# Patient Record
Sex: Female | Born: 1985 | Hispanic: Yes | Smoking: Never smoker
Health system: Southern US, Community
[De-identification: ages and names within clinical notes are randomized; demographics above are authoritative.]

## PROBLEM LIST (undated history)

## (undated) DIAGNOSIS — Z789 Other specified health status: Secondary | ICD-10-CM

## (undated) DIAGNOSIS — Z8659 Personal history of other mental and behavioral disorders: Secondary | ICD-10-CM

## (undated) DIAGNOSIS — F99 Mental disorder, not otherwise specified: Secondary | ICD-10-CM

## (undated) DIAGNOSIS — D689 Coagulation defect, unspecified: Secondary | ICD-10-CM

## (undated) DIAGNOSIS — R87629 Unspecified abnormal cytological findings in specimens from vagina: Secondary | ICD-10-CM

## (undated) DIAGNOSIS — I959 Hypotension, unspecified: Secondary | ICD-10-CM

## (undated) DIAGNOSIS — F32A Depression, unspecified: Secondary | ICD-10-CM

## (undated) DIAGNOSIS — I2699 Other pulmonary embolism without acute cor pulmonale: Secondary | ICD-10-CM

## (undated) DIAGNOSIS — Z8489 Family history of other specified conditions: Secondary | ICD-10-CM

## (undated) DIAGNOSIS — T883XXA Malignant hyperthermia due to anesthesia, initial encounter: Secondary | ICD-10-CM

## (undated) DIAGNOSIS — D649 Anemia, unspecified: Secondary | ICD-10-CM

## (undated) DIAGNOSIS — Z811 Family history of alcohol abuse and dependence: Secondary | ICD-10-CM

## (undated) DIAGNOSIS — Z0282 Encounter for adoption services: Secondary | ICD-10-CM

## (undated) HISTORY — PX: DENTAL SURGERY: SHX609

## (undated) HISTORY — DX: Anemia, unspecified: D64.9

## (undated) HISTORY — DX: Coagulation defect, unspecified: D68.9

## (undated) HISTORY — DX: Family history of alcohol abuse and dependence: Z81.1

## (undated) HISTORY — DX: Mental disorder, not otherwise specified: F99

## (undated) HISTORY — DX: Unspecified abnormal cytological findings in specimens from vagina: R87.629

---
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
## 2005-02-28 ENCOUNTER — Emergency Department: Payer: Self-pay | Admitting: Internal Medicine

## 2005-03-01 ENCOUNTER — Ambulatory Visit: Payer: Self-pay | Admitting: Internal Medicine

## 2005-04-08 ENCOUNTER — Emergency Department: Payer: Self-pay | Admitting: Unknown Physician Specialty

## 2005-05-16 ENCOUNTER — Observation Stay: Payer: Self-pay | Admitting: Obstetrics and Gynecology

## 2005-06-14 ENCOUNTER — Emergency Department: Payer: Self-pay | Admitting: Internal Medicine

## 2005-06-19 ENCOUNTER — Emergency Department: Payer: Self-pay | Admitting: Unknown Physician Specialty

## 2005-06-19 ENCOUNTER — Observation Stay: Payer: Self-pay | Admitting: Unknown Physician Specialty

## 2005-06-28 ENCOUNTER — Observation Stay: Payer: Self-pay

## 2005-08-23 ENCOUNTER — Observation Stay: Payer: Self-pay

## 2005-09-14 ENCOUNTER — Observation Stay: Payer: Self-pay | Admitting: Obstetrics and Gynecology

## 2005-09-18 ENCOUNTER — Observation Stay: Payer: Self-pay

## 2005-09-21 ENCOUNTER — Observation Stay: Payer: Self-pay | Admitting: Obstetrics and Gynecology

## 2005-09-26 ENCOUNTER — Observation Stay: Payer: Self-pay

## 2005-09-27 ENCOUNTER — Inpatient Hospital Stay: Payer: Self-pay | Admitting: Certified Nurse Midwife

## 2006-01-08 ENCOUNTER — Emergency Department: Payer: Self-pay | Admitting: Emergency Medicine

## 2006-09-05 ENCOUNTER — Emergency Department: Payer: Self-pay | Admitting: Emergency Medicine

## 2006-09-09 ENCOUNTER — Emergency Department: Payer: Self-pay | Admitting: Emergency Medicine

## 2007-05-25 ENCOUNTER — Emergency Department: Payer: Self-pay | Admitting: Emergency Medicine

## 2007-06-08 ENCOUNTER — Emergency Department: Payer: Self-pay | Admitting: Emergency Medicine

## 2007-10-03 ENCOUNTER — Emergency Department: Payer: Self-pay | Admitting: Emergency Medicine

## 2008-05-02 ENCOUNTER — Emergency Department: Payer: Self-pay | Admitting: Emergency Medicine

## 2008-12-29 ENCOUNTER — Emergency Department: Payer: Self-pay | Admitting: Emergency Medicine

## 2009-03-08 ENCOUNTER — Emergency Department: Payer: Self-pay | Admitting: Unknown Physician Specialty

## 2009-08-04 ENCOUNTER — Inpatient Hospital Stay: Payer: Self-pay

## 2009-12-14 ENCOUNTER — Emergency Department: Payer: Self-pay | Admitting: Unknown Physician Specialty

## 2010-03-09 ENCOUNTER — Emergency Department: Payer: Self-pay | Admitting: Emergency Medicine

## 2010-10-27 ENCOUNTER — Emergency Department: Payer: Self-pay | Admitting: Emergency Medicine

## 2011-01-18 ENCOUNTER — Emergency Department: Payer: Self-pay | Admitting: Emergency Medicine

## 2011-04-17 ENCOUNTER — Emergency Department: Payer: Self-pay | Admitting: *Deleted

## 2011-05-06 ENCOUNTER — Emergency Department: Payer: Self-pay | Admitting: Emergency Medicine

## 2011-05-18 DIAGNOSIS — I2699 Other pulmonary embolism without acute cor pulmonale: Secondary | ICD-10-CM

## 2011-05-18 HISTORY — DX: Other pulmonary embolism without acute cor pulmonale: I26.99

## 2011-05-25 ENCOUNTER — Emergency Department: Payer: Self-pay | Admitting: *Deleted

## 2012-05-19 ENCOUNTER — Emergency Department: Payer: Self-pay | Admitting: Emergency Medicine

## 2012-05-19 LAB — CBC
MCHC: 34.1 g/dL (ref 32.0–36.0)
Platelet: 216 10*3/uL (ref 150–440)
RBC: 4.16 10*6/uL (ref 3.80–5.20)
RDW: 12.4 % (ref 11.5–14.5)
WBC: 5.5 10*3/uL (ref 3.6–11.0)

## 2012-05-19 LAB — URINALYSIS, COMPLETE
Bacteria: NONE SEEN
Blood: NEGATIVE
Glucose,UR: NEGATIVE mg/dL (ref 0–75)
Leukocyte Esterase: NEGATIVE
Nitrite: NEGATIVE
Protein: NEGATIVE
RBC,UR: 2 /HPF (ref 0–5)
Specific Gravity: 1.019 (ref 1.003–1.030)

## 2012-05-19 LAB — COMPREHENSIVE METABOLIC PANEL
Albumin: 3.9 g/dL (ref 3.4–5.0)
Alkaline Phosphatase: 84 U/L (ref 50–136)
Anion Gap: 7 (ref 7–16)
BUN: 16 mg/dL (ref 7–18)
Bilirubin,Total: 0.4 mg/dL (ref 0.2–1.0)
Calcium, Total: 9 mg/dL (ref 8.5–10.1)
Chloride: 106 mmol/L (ref 98–107)
Co2: 28 mmol/L (ref 21–32)
Creatinine: 0.72 mg/dL (ref 0.60–1.30)
EGFR (African American): >60
Potassium: 3.9 mmol/L (ref 3.5–5.1)
SGPT (ALT): 21 U/L (ref 12–78)
Total Protein: 7.8 g/dL (ref 6.4–8.2)

## 2012-05-19 LAB — PROTIME-INR: INR: 1

## 2012-05-25 ENCOUNTER — Encounter: Payer: Self-pay | Admitting: Maternal & Fetal Medicine

## 2012-10-28 ENCOUNTER — Emergency Department: Payer: Self-pay | Admitting: Emergency Medicine

## 2012-10-28 LAB — URINALYSIS, COMPLETE
Bacteria: NONE SEEN
Bilirubin,UR: NEGATIVE
Blood: NEGATIVE
Glucose,UR: NEGATIVE mg/dL (ref 0–75)
Ketone: NEGATIVE
Leukocyte Esterase: NEGATIVE
Nitrite: NEGATIVE
Protein: NEGATIVE
Specific Gravity: 1.028 (ref 1.003–1.030)
Squamous Epithelial: 1

## 2012-10-28 LAB — HCG, QUANTITATIVE, PREGNANCY: Beta Hcg, Quant.: 71889 m[IU]/mL — ABNORMAL HIGH

## 2012-10-28 LAB — CBC
HGB: 12.4 g/dL (ref 12.0–16.0)
MCH: 31.1 pg (ref 26.0–34.0)
MCV: 91 fL (ref 80–100)
Platelet: 218 10*3/uL (ref 150–440)
RBC: 3.97 10*6/uL (ref 3.80–5.20)
RDW: 12.3 % (ref 11.5–14.5)

## 2012-12-11 ENCOUNTER — Emergency Department: Payer: Self-pay | Admitting: Emergency Medicine

## 2012-12-11 LAB — BASIC METABOLIC PANEL
Anion Gap: 5 — ABNORMAL LOW (ref 7–16)
Chloride: 104 mmol/L (ref 98–107)
Co2: 26 mmol/L (ref 21–32)
Creatinine: 0.56 mg/dL — ABNORMAL LOW (ref 0.60–1.30)
EGFR (African American): >60
EGFR (Non-African Amer.): >60
Osmolality: 267 (ref 275–301)
Potassium: 4 mmol/L (ref 3.5–5.1)
Sodium: 135 mmol/L — ABNORMAL LOW (ref 136–145)

## 2012-12-11 LAB — URINALYSIS, COMPLETE
Ketone: NEGATIVE
Nitrite: NEGATIVE
Protein: NEGATIVE
WBC UR: <1 /HPF (ref 0–5)

## 2012-12-11 LAB — CBC
HCT: 37 % (ref 35.0–47.0)
HGB: 12.9 g/dL (ref 12.0–16.0)
MCH: 31.7 pg (ref 26.0–34.0)
MCHC: 34.8 g/dL (ref 32.0–36.0)
Platelet: 204 10*3/uL (ref 150–440)
RBC: 4.06 10*6/uL (ref 3.80–5.20)
RDW: 12.9 % (ref 11.5–14.5)

## 2012-12-27 ENCOUNTER — Emergency Department: Payer: Self-pay | Admitting: Emergency Medicine

## 2012-12-27 LAB — COMPREHENSIVE METABOLIC PANEL
Alkaline Phosphatase: 54 U/L (ref 50–136)
Anion Gap: 5 — ABNORMAL LOW (ref 7–16)
Calcium, Total: 8.6 mg/dL (ref 8.5–10.1)
Chloride: 104 mmol/L (ref 98–107)
Co2: 27 mmol/L (ref 21–32)
Creatinine: 0.48 mg/dL — ABNORMAL LOW (ref 0.60–1.30)
EGFR (African American): >60
EGFR (Non-African Amer.): >60
Glucose: 69 mg/dL (ref 65–99)
Potassium: 3.9 mmol/L (ref 3.5–5.1)

## 2012-12-27 LAB — URINALYSIS, COMPLETE
Leukocyte Esterase: NEGATIVE
Nitrite: NEGATIVE
Protein: NEGATIVE
RBC,UR: <1 /HPF (ref 0–5)
Specific Gravity: 1.01 (ref 1.003–1.030)
WBC UR: <1 /HPF (ref 0–5)

## 2012-12-27 LAB — LIPASE, BLOOD: Lipase: 82 U/L (ref 73–393)

## 2012-12-27 LAB — CBC
HCT: 35.6 % (ref 35.0–47.0)
MCHC: 35.2 g/dL (ref 32.0–36.0)
MCV: 91 fL (ref 80–100)
WBC: 7.3 10*3/uL (ref 3.6–11.0)

## 2012-12-27 LAB — HCG, QUANTITATIVE, PREGNANCY: Beta Hcg, Quant.: 20128 m[IU]/mL — ABNORMAL HIGH

## 2013-01-11 ENCOUNTER — Encounter: Payer: Self-pay | Admitting: Maternal & Fetal Medicine

## 2013-02-01 ENCOUNTER — Observation Stay: Payer: Self-pay | Admitting: Obstetrics and Gynecology

## 2013-02-01 LAB — URINALYSIS, COMPLETE
Bilirubin,UR: NEGATIVE
Blood: NEGATIVE
Glucose,UR: NEGATIVE mg/dL (ref 0–75)
Leukocyte Esterase: NEGATIVE
Nitrite: NEGATIVE
Protein: NEGATIVE
Specific Gravity: 1.002 (ref 1.003–1.030)
Squamous Epithelial: 1

## 2013-03-08 ENCOUNTER — Encounter: Payer: Self-pay | Admitting: Maternal and Fetal Medicine

## 2013-03-19 ENCOUNTER — Encounter: Payer: Self-pay | Admitting: Obstetrics & Gynecology

## 2013-03-22 ENCOUNTER — Observation Stay: Payer: Self-pay | Admitting: Obstetrics and Gynecology

## 2013-03-22 LAB — COMPREHENSIVE METABOLIC PANEL
Albumin: 3 g/dL — ABNORMAL LOW (ref 3.4–5.0)
Bilirubin,Total: 0.4 mg/dL (ref 0.2–1.0)
Co2: 23 mmol/L (ref 21–32)
Glucose: 68 mg/dL (ref 65–99)
Osmolality: 265 (ref 275–301)
Total Protein: 6.8 g/dL (ref 6.4–8.2)

## 2013-03-22 LAB — CBC WITH DIFFERENTIAL/PLATELET
Basophil #: 0 10*3/uL (ref 0.0–0.1)
Basophil %: 0.3 %
Eosinophil #: 0.1 10*3/uL (ref 0.0–0.7)
HCT: 34 % — ABNORMAL LOW (ref 35.0–47.0)
Lymphocyte #: 2.9 10*3/uL (ref 1.0–3.6)
Lymphocyte %: 29.5 %
MCV: 91 fL (ref 80–100)
Monocyte %: 4.6 %
Neutrophil #: 6.3 10*3/uL (ref 1.4–6.5)
RBC: 3.72 10*6/uL — ABNORMAL LOW (ref 3.80–5.20)
RDW: 12.8 % (ref 11.5–14.5)
WBC: 9.7 10*3/uL (ref 3.6–11.0)

## 2013-03-22 LAB — HEPATIC FUNCTION PANEL A (ARMC): Bilirubin, Direct: <0.1 mg/dL (ref 0.00–0.20)

## 2013-04-10 ENCOUNTER — Observation Stay: Payer: Self-pay | Admitting: Obstetrics and Gynecology

## 2013-05-17 ENCOUNTER — Observation Stay: Payer: Self-pay

## 2013-05-28 ENCOUNTER — Inpatient Hospital Stay: Payer: Self-pay | Admitting: Obstetrics and Gynecology

## 2013-05-28 LAB — CBC WITH DIFFERENTIAL/PLATELET
BASOS ABS: 0.1 10*3/uL (ref 0.0–0.1)
BASOS PCT: 0.4 %
EOS ABS: 0 10*3/uL (ref 0.0–0.7)
Eosinophil %: 0.4 %
HCT: 34.4 % — ABNORMAL LOW (ref 35.0–47.0)
HGB: 11.7 g/dL — ABNORMAL LOW (ref 12.0–16.0)
Lymphocyte #: 2.2 10*3/uL (ref 1.0–3.6)
Lymphocyte %: 18.7 %
MCH: 30.5 pg (ref 26.0–34.0)
MCHC: 34 g/dL (ref 32.0–36.0)
MCV: 90 fL (ref 80–100)
MONO ABS: 0.5 x10 3/mm (ref 0.2–0.9)
Monocyte %: 4.4 %
Neutrophil #: 8.8 10*3/uL — ABNORMAL HIGH (ref 1.4–6.5)
Neutrophil %: 76.1 %
Platelet: 196 10*3/uL (ref 150–440)
RBC: 3.84 10*6/uL (ref 3.80–5.20)
RDW: 14.1 % (ref 11.5–14.5)
WBC: 11.6 10*3/uL — AB (ref 3.6–11.0)

## 2013-05-28 LAB — CREATININE, SERUM: CREATININE: 0.48 mg/dL — AB (ref 0.60–1.30)

## 2013-05-28 LAB — APTT: Activated PTT: 30.2 secs (ref 23.6–35.9)

## 2013-05-29 LAB — HEMOGLOBIN: HGB: 10.4 g/dL — AB (ref 12.0–16.0)

## 2013-06-01 ENCOUNTER — Observation Stay: Payer: Self-pay

## 2014-05-17 ENCOUNTER — Emergency Department: Payer: Self-pay | Admitting: Emergency Medicine

## 2014-06-16 ENCOUNTER — Emergency Department: Payer: Self-pay | Admitting: Physician Assistant

## 2014-08-16 ENCOUNTER — Ambulatory Visit: Payer: Self-pay

## 2014-09-06 NOTE — Consult Note (Signed)
Referral Information:  Reason for Referral Follow-up MFM Consult in setting of a history of pulmonary embolism   Referring Physician [name]   Prenatal Hx Kaitlyn Turner is a 29 year-old G6 P2032 at 25 2/7 weeks (reports EDC of 06/16/13) who returns to discuss mgt of anticoagulation in pregnancy. See full consult from Dr. Diamantina Monks dated 01/11/13.  In short, pt experienced a pulmonary embolism in 2011 while [redacted] weeks pregnant.  In that pregnancy she elected for pregnancy termination and was then maintained on anticoagulation (ultimately coumadin) for approximately 1.5 years. 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  Kaitlyn Turner reports having normal ultrasounds this pregnancy with [name] (reports not provided to our office). She is currently taking Lovenox 40 mg daily and denies any bleeding complications. She has no concerns.  She also has mild asthma with rare albuterol usage.   Home Medications: Medication Instructions Status  Lovenox 40 mg/0.4 mL injectable solution 40 unit(s) injectable 2 times a day Active  Prenatal 1 Prenatal Multivitamins with Folic Acid 1 mg oral capsule 1 cap(s) orally once a day Active  albuterol 2 puff(s) inhaled 3 times a day, As Needed - for Shortness of Breath Active   Allergies:   Keflex: Other  Stadol: Other  Other- Explain in Comments Line: Other  Rubella Virus Vaccine: Anaphylaxis  Pork: Itching, Swelling, Rash  Vital Signs/Notes:  Nursing Vital Signs: **Vital Signs.:   03-Nov-14 11:20  Pulse Pulse 84  Systolic BP Systolic BP 449  Diastolic BP (mmHg) Diastolic BP (mmHg) 66   Impression/Recommendations:  Impression 29 year-old G6 P2032 at 27 2/7 weeks on prophylactic dosing Lovenox due to history of pulmonary embolism in 2011. She is doing well and denies any bleeding complications.   Recommendations -Dr. Diamantina Monks reviewed the patient's thrombophilia  panel during her consultation in August 2014 and recommended adding a Protein S and Protein C panel, which was sent today -Continue Lovenox 40 mg daily -Transition to unfractionated heparin (10,000 Units every 12 hours) at 35-36 weeks -Recommend delivery at 39 weeks, having held heparin the night prior to and morning of induction. Check PTT on presentation to L&D and prior to regional anesthesia. -SCDs on in labor -Restart prophylactic dosing 12 hours after a vaginal delivery or 24 hours after cesarean delivery with Lovenox 40 mg daily and continue for 6 weeks postpartum -Alert anesthesiology of history of malignant hyperthemia.  Regional anesthesia placed early in labor course can help prevent the need for general anesthesia in setting of emergency.  See consult from Dr. Diamantina Monks for [place] monthly assessment of fetal growty by ultrasound -Weekly testing to start at approximately 36 weeks or sooner if clinically indicated -We will be happy to perform any of the above testing recommendations -Recommned to check a CBC and PTT one week after transitioning to unfractionated heparin (ensure not overly anticoagulated and has normal platelet count)    Total Time Spent with Patient 15 minutes   Office Use Only [id number]  Office Visit Level 3 (71min) EST exp prob focused outpt   Coding Description: OTHER: History of pulmonary embolism, now pregnant.  Electronic Signatures: Machael Raine, [name]  (Signed [id number] 12:26)  Authored: Referral, Home Medications, Allergies, Vital Signs/Notes, Impression, Billing, Coding Description   Last Updated: 03-Nov-14 12:26 by [name], [name]

## 2014-09-06 NOTE — Consult Note (Signed)
Referral Information:  Reason for Referral 29 yo G6 P2032 with LMP 09/03/12 and [facility] 06/16/12 currently 17/ 5 weeks by LMP c/w 7w ultrasound.  Her history is significant for pulmonary embolism (07/2009) while [redacted] weeks pregnant. She presented to [facility] with chest pain "I thought I was having a heart attack".  Filling defects were noted in branc of LLL of pulmonary artery.  Hypercoaguable studies (below) were negative.  Lower extremity ultrasounds were neg for DVT. She underwent elective termination and was anticoagulated with Lovenox, followed by Coumadin for approximately 1.5 years. She was seen by [facility] (Dr. Dionne Milo, K. Vokarty, NP) and MFM (Dr. Lehman Prom) CT angiogram from 05/2012 was negative for PE.   Referring Physician Dr. Ouida Sills   Past Obstetrical Hx 01/2012 9 week miscarriage 10/2010 6 week miscarriage 08/2009 elective termination due to PE  (we did not address these pregnancies--pt accompained by family member) 09/2005, 42 weeks, female, 8 lb 11 oz, Spontaneous Vaginal Delivery, epidural 09/2003, 38 weeks, female, 8lb 12 oz, Spontaneous Vaginal Delivery, epidural   Home Medications: Medication Instructions Status  Lovenox 40 mg/0.4 mL injectable solution 40 unit(s) injectable 2 times a day Active  Prenatal 1 Prenatal Multivitamins with Folic Acid 1 mg oral capsule 1 cap(s) orally once a day Active  albuterol 2 puff(s) inhaled 3 times a day, As Needed - for Shortness of Breath Active   Allergies:   Keflex: Other  Stadol: Other  Other- Explain in Comments Line: Other  Rubella Virus Vaccine: Anaphylaxis  Pork: Itching, Swelling, Rash  Vital Signs/Notes:  Nursing Vital Signs: **Vital Signs.:   28-Aug-14 10:54  Vital Signs Type Routine  Temperature Temperature (F) 97.9  Celsius 36.6  Temperature Source axillary  Pulse Pulse 74  Respirations Respirations 18  Systolic BP Systolic BP 735  Diastolic BP (mmHg) Diastolic BP (mmHg) 61  Mean BP 79   Perinatal Consult:  Past  Medical History cont'd 1. History of VTE (as above)  2. Malignant Hyperthermia  3. Asthma, mild   PSurg Hx tooth extraction age 77   FHx No family history of DVT or PE.  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 2 benign appearing nodules in RUL (75mm) and RLL (55mm).  Follow up in 1 year suggested by radiology. 06/2011 DRVVT <1.20 Anti Beta 2 glyprotein negative Anticardiolipin ab negatie Prothrombin Gene mutation---Normal Factor V leiden--normal Antithrombin III normal Negative Homocysteine (2011)   Impression/Recommendations:  Impression 29 yo G6 P2032 at 17/ 5 weeks with history of PE in 2011 and negative thrombophilia evaluation s/p approximately 1.5 years of anticoagulation.  We addressed her risk factor of  pregnancy related hypercoaguability and the higher risk for recurrence during pregnancy due to this history.  We also addressed the most recent guidelines for anticoagulation (ACOG Practice bulletin [phone]. Thromboembolism in pregnancy. Obstet Gynecol 2013; 122; 706.) during pregnancy for patients with her history.  Prophylactic Lovenox is recommended at a dose of 40mg  daily.  We also addressed the pregnancy surveillance (outlined below) for growth restriction due to risk for placental dysfunction in a patient with previous VTE. 2. Malignant Hyperthermia--she reports desire not to have regional anesthesia for labor pain management.  She was advised to have epidural with her previous pregnancies. We addressed the reasoning for this approach  based on concerns for available anesthetic options in the setting of a possible stat cesarean delivery.  An additional approach could be for the catheter to be placed and tested but not used for labor anesthesia.  These  decisions and practices will be deferred to anesthesiology.   Recommendations 1.  Lovenox 40mg  daily until approximately 35-36 weeks.   2. Postpartum anticoagulation with Lovenox 40mg  daily to begin 12 h after vaginal delivery or 24h after cesarean delivery.  3. I would recommend conversion to unfractionated heparin at [redacted] weeks gestation.  We can arrange follow up MFM at 32-34 weeks consultation  4. Recommend fetal growth assessments (as outlined below) and weekly antenatal testing at 36 weeks (sooner if clinically indicated) 5.  Consider anetnatal anesthesiology consultation for labor managment planning.  6.  I have scheduled a follow up MFM consultation in 8 weeks.  I did not see results of  Protein C and S testing.  I will search further, however, we can perform protein S screening at the time of her follow up if results are not available in [place] or [facility] systems.   Plan:  Prenatal Diagnosis Options Level II [place]   Ultrasound at what gestational ages 45 and 83 weeks   Antepartum Testing Weekly, starting at 33 weeks   Delivery Mode Vaginal   Additional Testing Folate/prenatal vitamins   Delivery at what gestational age [redacted] weeks    Total Time Spent with Patient 30 minutes   >50% of visit spent in couseling/coordination of care yes   Office Use Only 99242  Level 2 (47min) NEW office consult exp prob focused   Coding Description: MATERNAL CONDITIONS/HISTORY INDICATION(S).   Pulmonary emboli.  Electronic Signatures: Manfred Shirts (MD)  (Signed 28-Aug-14 15:44)  Authored: Referral, Home Medications, Allergies, Vital Signs/Notes, Consult, Lab/Radiology Notes, Impression, Plan, Billing, Coding Description   Last Updated: 28-Aug-14 15:44 by Manfred Shirts (MD)

## 2014-09-07 NOTE — H&P (Signed)
   Subjective/Chief Complaint Severe HA x 4 days all over head and in back of neck after 5 attempts at needle insertion for epidural. Pt was seen by Dr Kayleen Memos and advised to come in for a possible Blood patch. Pt has had no relief with HA since dc home.   History of Present Illness Pt had a SVD of a viable female infant on 05/28/13 at 37 2/7 weeks of pregnancy. Pregnancy was complicated with prior hx of malignant hyperthermia and +PE on Lovenox 40 mg SQ for the pregnancy. pt continues on that dose now and did not bring meds.   Past History Malignant Hypertension and PE   Past Med/Surgical Hx:  malignant hyperthermia:   abortion:   PE: since 06/2009  asthma:   denies surg hx:   ALLERGIES:  Keflex: Other  Stadol: Other  Other- Explain in Comments Line: Other  Rubella Virus Vaccine: Anaphylaxis  Pork: Itching, Swelling, Rash  HOME MEDICATIONS: Medication Instructions Status  acetaminophen 325 mg oral tablet 2 tab(s) orally every 3 to 4 hours, As needed, pain Active  enoxaparin 40 milligram(s) subcutaneous once a day Active  albuterol 2 puff(s) inhaled 3 times a day, As Needed - for Shortness of Breath Active   Family and Social History:  Family History Non-Contributory   Social History negative tobacco, negative ETOH, negative Illicit drugs   Place of Living Home   Review of Systems:  Subjective/Chief Complaint HA   Fever/Chills No   Cough No   Sputum No   Abdominal Pain No   Diarrhea No   Constipation No   Nausea/Vomiting No   SOB/DOE No   Chest Pain No   Dysuria No   Tolerating Diet Yes   Physical Exam:  GEN well developed   HEENT moist oral mucosa   NECK supple   RESP normal resp effort  clear BS   CARD regular rate   ABD denies tenderness   LYMPH negative neck   EXTR negative edema   SKIN normal to palpation   PSYCH alert, A+O to time, place, person    Assessment/Admission Diagnosis A: 4 days PP with Spinal HA P: Cont Lovenox See  orders   Electronic Signatures: Catheryn Bacon (CNM)  (Signed 16-Jan-15 21:28)  Authored: CHIEF COMPLAINT and HISTORY, PAST MEDICAL/SURGIAL HISTORY, ALLERGIES, HOME MEDICATIONS, FAMILY AND SOCIAL HISTORY, REVIEW OF SYSTEMS, PHYSICAL EXAM, ASSESSMENT AND PLAN   Last Updated: 16-Jan-15 21:28 by Catheryn Bacon (CNM)

## 2014-09-18 ENCOUNTER — Emergency Department
Admission: EM | Admit: 2014-09-18 | Discharge: 2014-09-18 | Disposition: A | Discharge status: Home / Self Care | Payer: Self-pay | Attending: Emergency Medicine | Admitting: Emergency Medicine

## 2014-09-18 DIAGNOSIS — N61 Inflammatory disorders of breast: Secondary | ICD-10-CM | POA: Insufficient documentation

## 2014-09-18 DIAGNOSIS — R509 Fever, unspecified: Secondary | ICD-10-CM | POA: Insufficient documentation

## 2014-09-18 MED ORDER — SULFAMETHOXAZOLE-TRIMETHOPRIM 800-160 MG PO TABS: 1.0000 | ORAL_TABLET | Freq: Two times a day (BID) | ORAL | Status: DC

## 2014-09-18 MED ORDER — OXYCODONE-ACETAMINOPHEN 7.5-325 MG PO TABS: 1.0000 | ORAL_TABLET | Freq: Four times a day (QID) | ORAL | Status: AC | PRN

## 2014-09-18 NOTE — ED Notes (Signed)
Pt in with co left breast pain, states stopped breast feeding 9-10 days ago, now having increasing left breast pain and states low grade fever at home.

## 2014-09-18 NOTE — ED Provider Notes (Signed)
[facility] Emergency Department Provider Note    ____________________________________________  Time seen: 1958  I have reviewed the triage vital signs and the nursing notes.   HISTORY  Chief Complaint Breast Pain  Patient states left breast pain for 3 days. Patient recently stop breat feeding 9 days ago.      HPI Kaitlyn Turner is a 29 y.o. female left breat pain and edema for 3 days.  Pain recently stop breat feeding 9 days ago.  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 Patient rates pain as 8/10. Pain is constantly dull.Pain increase with any presuure applied to breast.     No past medical history on file.  There are no active problems to display for this patient.   No past surgical history on file.  Current Outpatient Rx  Name  Route  Sig  Dispense  Refill  . oxyCODONE-acetaminophen (PERCOCET) 7.5-325 MG per tablet   Oral   Take 1 tablet by mouth every 6 (six) hours as needed for moderate pain or severe pain.   12 tablet   0   . sulfamethoxazole-trimethoprim (BACTRIM DS) 800-160 MG per tablet   Oral   Take 1 tablet by mouth 2 (two) times daily.   20 tablet   20     Allergies Keflex; Toradol; and Tramadol  No family history on file.  Social History History  Substance Use Topics  . Smoking status: Not on file  . Smokeless tobacco: Not on file  . Alcohol Use: Not on file    Review of Systems  Constitutional: Positive for fever. Eyes: Negative for visual changes. ENT: Negative for sore throat. Cardiovascular: Negative for chest pain. Respiratory: Negative for shortness of breath. Gastrointestinal: Negative for abdominal pain, vomiting and diarrhea. Genitourinary: Negative for dysuria. Musculoskeletal: Negative for back pain. Skin: Negative for rash. Neurological: Negative for headaches, focal weakness or numbness. Psychiatric:None Endocrine:None Hematological/Lymphatic:None Allergic/Immunilogical:  Keflex  10-point ROS otherwise negative.  ____________________________________________   PHYSICAL EXAM:  VITAL SIGNS: ED Triage Vitals  Enc Vitals Group     BP 09/18/14 1942 117/79 mmHg     Pulse Rate 09/18/14 1942 86     Resp --      Temp 09/18/14 1942 98.1 F (36.7 C)     Temp Source 09/18/14 1942 Oral     SpO2 09/18/14 1942 100 %     Weight 09/18/14 1942 156 lb (70.761 kg)     Height 09/18/14 1942 5\' 6"  (1.676 m)     Head Cir --      Peak Flow --      Pain Score 09/18/14 1943 8     Pain Loc --      Pain Edu? --      Excl. in [place]? --      Constitutional: Alert and oriented. Well appearing and in no distress. Eyes: Conjunctivae are normal. PERRL. Normal extraocular movements. ENT   Head: Normocephalic and atraumatic.   Nose: No congestion/rhinnorhea.   Mouth/Throat: Mucous membranes are moist.   Neck: No stridor. Hematological/Lymphatic/Immunilogical: No cervical lymphadenopathy. Left breast edematous/erythematous with nipple discharge. Cardiovascular: Normal rate, regular rhythm. Normal and symmetric distal pulses are present in all extremities. No murmurs, rubs, or gallops. Respiratory: Normal respiratory effort without tachypnea nor retractions. Breath sounds are clear and equal bilaterally. No wheezes/rales/rhonchi. Gastrointestinal: Soft and nontender. No distention. No abdominal bruits. There is no CVA tenderness. Genitourinary: Not examined Musculoskeletal: Nontender with normal range of motion in all extremities. No joint effusions.  No lower extremity tenderness nor edema. Neurologic:  Normal speech and language. No gross focal neurologic deficits are appreciated. Speech is normal. No gait instability. Skin:  Skin is warm, dry and intact. No rash noted. Psychiatric: Mood and affect are normal. Speech and behavior are normal. Patient exhibits appropriate insight and judgment.  ____________________________________________    LABS (pertinent  positives/negatives)    ____________________________________________   EKG    ____________________________________________    RADIOLOGY    ____________________________________________   PROCEDURES  Procedure(s) performed: None  Critical Care performed: No  ____________________________________________   INITIAL IMPRESSION / ASSESSMENT AND PLAN / ED COURSE  Pertinent labs & imaging results that were available during my care of the patient were reviewed by me and considered in my medical decision making (see chart for details).  Culture of left breast discharge ____________________________________________   FINAL CLINICAL IMPRESSION(S) / ED DIAGNOSES  Final diagnoses:  Mastitis, left, acute     Sable Feil, PA-C 09/18/14 2019  Sable Feil, PA-C 09/18/14 2019  Doran Stabler, MD 09/19/14 0030

## 2014-09-18 NOTE — Discharge Instructions (Signed)
Breastfeeding and Mastitis °Mastitis is inflammation of the breast tissue. It can occur in women who are breastfeeding. This can make breastfeeding painful. Mastitis will sometimes go away on its own. Your health care provider will help determine if treatment is needed. °CAUSES °Mastitis is often associated with a blocked milk (lactiferous) duct. This can happen when too much milk builds up in the breast. Causes of excess milk in the breast can include: °· Poor latch-on. If your baby is not latched onto the breast properly, she or he may not empty your breast completely while breastfeeding. °· Allowing too much time to pass between feedings. °· Wearing a bra or other clothing that is too tight. This puts extra pressure on the lactiferous ducts so milk does not flow through them as it should. °Mastitis can also be caused by a bacterial infection. Bacteria may enter the breast tissue through cuts or openings in the skin. In women who are breastfeeding, this may occur because of cracked or irritated skin. Cracks in the skin are often caused when your baby does not latch on properly to the breast. °SIGNS AND SYMPTOMS °· Swelling, redness, tenderness, and pain in an area of the breast. °· Swelling of the glands under the arm on the same side. °· Fever may or may not accompany mastitis. °If an infection is allowed to progress, a collection of pus (abscess) may develop. °DIAGNOSIS  °Your health care provider can usually diagnose mastitis based on your symptoms and a physical exam. Tests may be done to help confirm the diagnosis. These may include: °· Removal of pus from the breast by applying pressure to the area. This pus can be examined in the lab to determine which bacteria are present. If an abscess has developed, the fluid in the abscess can be removed with a needle. This can also be used to confirm the diagnosis and determine the bacteria present. In most cases, pus will not be present. °· Blood tests to determine if  your body is fighting a bacterial infection. °· Mammogram or ultrasound tests to rule out other problems or diseases. °TREATMENT  °Mastitis that occurs with breastfeeding will sometimes go away on its own. Your health care provider may choose to wait 24 hours after first seeing you to decide whether a prescription medicine is needed. If your symptoms are worse after 24 hours, your health care provider will likely prescribe an antibiotic medicine to treat the mastitis. He or she will determine which bacteria are most likely causing the infection and will then select an appropriate antibiotic medicine. This is sometimes changed based on the results of tests performed to identify the bacteria, or if there is no response to the antibiotic medicine selected. Antibiotic medicines are usually given by mouth. You may also be given medicine for pain. °HOME CARE INSTRUCTIONS °· Only take over-the-counter or prescription medicines for pain, fever, or discomfort as directed by your health care provider. °· If your health care provider prescribed an antibiotic medicine, take the medicine as directed. Make sure you finish it even if you start to feel better. °· Do not wear a tight or underwire bra. Wear a soft, supportive bra. °· Increase your fluid intake, especially if you have a fever. °· Continue to empty the breast. Your health care provider can tell you whether this milk is safe for your infant or needs to be thrown out. You may be told to stop nursing until your health care provider thinks it is safe for your baby.   Use a breast pump if you are advised to stop nursing. °· Keep your nipples clean and dry. °· Empty the first breast completely before going to the other breast. If your baby is not emptying your breasts completely for some reason, use a breast pump to empty your breasts. °· If you go back to work, pump your breasts while at work to stay in time with your nursing schedule. °· Avoid allowing your breasts to become  overly filled with milk (engorged). °SEEK MEDICAL CARE IF: °· You have pus-like discharge from the breast. °· Your symptoms do not improve with the treatment prescribed by your health care provider within 2 days. °SEEK IMMEDIATE MEDICAL CARE IF: °· Your pain and swelling are getting worse. °· You have pain that is not controlled with medicine. °· You have a red line extending from the breast toward your armpit. °· You have a fever or persistent symptoms for more than 2-3 days. °· You have a fever and your symptoms suddenly get worse. °MAKE SURE YOU:  °· Understand these instructions. °· Will watch your condition. °· Will get help right away if you are not doing well or get worse. °Document Released: 08/28/2004 Document Revised: 05/08/2013 Document Reviewed: 12/07/2012 °[employer]® Patient Information ©2015 [employer]. This information is not intended to replace advice given to you by your health care provider. Make sure you discuss any questions you have with your health care provider. ° °

## 2014-09-23 LAB — ANAEROBIC CULTURE

## 2014-09-24 LAB — WOUND CULTURE: CULTURE: NORMAL

## 2014-12-29 ENCOUNTER — Encounter: Payer: Self-pay | Admitting: Emergency Medicine

## 2014-12-29 DIAGNOSIS — Z792 Long term (current) use of antibiotics: Secondary | ICD-10-CM | POA: Insufficient documentation

## 2014-12-29 DIAGNOSIS — Y9389 Activity, other specified: Secondary | ICD-10-CM | POA: Insufficient documentation

## 2014-12-29 DIAGNOSIS — S0990XA Unspecified injury of head, initial encounter: Secondary | ICD-10-CM | POA: Insufficient documentation

## 2014-12-29 DIAGNOSIS — Z3202 Encounter for pregnancy test, result negative: Secondary | ICD-10-CM | POA: Insufficient documentation

## 2014-12-29 DIAGNOSIS — R55 Syncope and collapse: Secondary | ICD-10-CM | POA: Insufficient documentation

## 2014-12-29 DIAGNOSIS — Y998 Other external cause status: Secondary | ICD-10-CM | POA: Insufficient documentation

## 2014-12-29 DIAGNOSIS — Y9289 Other specified places as the place of occurrence of the external cause: Secondary | ICD-10-CM | POA: Insufficient documentation

## 2014-12-29 DIAGNOSIS — W01198A Fall on same level from slipping, tripping and stumbling with subsequent striking against other object, initial encounter: Secondary | ICD-10-CM | POA: Insufficient documentation

## 2014-12-29 NOTE — ED Notes (Signed)
Patient reports she had gotten off work and she passed out.  Patient reports "not feeling good" since this am.

## 2014-12-30 ENCOUNTER — Emergency Department: Payer: Self-pay

## 2014-12-30 ENCOUNTER — Other Ambulatory Visit: Payer: Self-pay

## 2014-12-30 ENCOUNTER — Emergency Department
Admission: EM | Admit: 2014-12-30 | Discharge: 2014-12-30 | Disposition: A | Discharge status: Home / Self Care | Payer: Self-pay | Attending: Emergency Medicine | Admitting: Emergency Medicine

## 2014-12-30 DIAGNOSIS — R55 Syncope and collapse: Secondary | ICD-10-CM

## 2014-12-30 HISTORY — DX: Other pulmonary embolism without acute cor pulmonale: I26.99

## 2014-12-30 LAB — TROPONIN I: Troponin I: <0.03 ng/mL (ref <0.031)

## 2014-12-30 LAB — URINALYSIS COMPLETE WITH MICROSCOPIC (ARMC ONLY)
Bilirubin Urine: NEGATIVE
Glucose, UA: NEGATIVE mg/dL
NITRITE: NEGATIVE
PROTEIN: 30 mg/dL — AB
SPECIFIC GRAVITY, URINE: 1.02 (ref 1.005–1.030)
pH: 5 (ref 5.0–8.0)

## 2014-12-30 LAB — BASIC METABOLIC PANEL
Anion gap: 8 (ref 5–15)
BUN: 10 mg/dL (ref 6–20)
CO2: 25 mmol/L (ref 22–32)
CREATININE: 0.86 mg/dL (ref 0.44–1.00)
Calcium: 8.9 mg/dL (ref 8.9–10.3)
Chloride: 105 mmol/L (ref 101–111)
GFR calc Af Amer: >60 mL/min (ref >60)
Glucose, Bld: 102 mg/dL — ABNORMAL HIGH (ref 65–99)
Potassium: 3.3 mmol/L — ABNORMAL LOW (ref 3.5–5.1)
SODIUM: 138 mmol/L (ref 135–145)

## 2014-12-30 LAB — POCT PREGNANCY, URINE: PREG TEST UR: NEGATIVE

## 2014-12-30 LAB — CBC
HCT: 38.5 % (ref 35.0–47.0)
Hemoglobin: 13.1 g/dL (ref 12.0–16.0)
MCH: 31.3 pg (ref 26.0–34.0)
MCHC: 34 g/dL (ref 32.0–36.0)
MCV: 92.1 fL (ref 80.0–100.0)
PLATELETS: 250 10*3/uL (ref 150–440)
RBC: 4.18 MIL/uL (ref 3.80–5.20)
RDW: 12.4 % (ref 11.5–14.5)
WBC: 8.1 10*3/uL (ref 3.6–11.0)

## 2014-12-30 MED ORDER — SODIUM CHLORIDE 0.9 % IV BOLUS (SEPSIS)
1000.0000 mL | Freq: Once | INTRAVENOUS | Status: AC
  Administered 2014-12-30: 1000 mL via INTRAVENOUS

## 2014-12-30 NOTE — ED Provider Notes (Signed)
[facility] Emergency Department Provider Note  ____________________________________________  Time seen: Approximately 311 AM  I have reviewed the triage vital signs and the nursing notes.   HISTORY  Chief Complaint Loss of Consciousness    HPI Kaitlyn Turner is a 29 y.o. female who comes in with 2 syncopal episodes. The patient reports that she was feeling dizzy all day. She got off work and got home to change or close to passing out episodes. The patient reports that the first time she was able to catch herself before she fell but the second episode she fell and hit her head. Patient reports that she was not unconscious for a long time. She reports that she is unable to describe the sensation of what she felt prior to passing out but felt as though her head was melting. She reports that she has passed out before but has been years. The patient denies any chest pain or shortness of breath but reports that she has a headache now. The patient reports that she has not felt well most of the day and ate and drink some but not much. She denies any nausea or vomiting, blurred vision, leg swelling or abdominal pain.Her pain is a 4 out of 10 in intensity.   Past Medical History  Diagnosis Date  . Pulmonary embolism     There are no active problems to display for this patient.   History reviewed. No pertinent past surgical history.  Current Outpatient Rx  Name  Route  Sig  Dispense  Refill  . sulfamethoxazole-trimethoprim (BACTRIM DS) 800-160 MG per tablet   Oral   Take 1 tablet by mouth 2 (two) times daily.   20 tablet   20     Allergies Keflex; Other; Toradol; and Tramadol  No family history on file.  Social History Social History  Substance Use Topics  . Smoking status: Never Smoker   . Smokeless tobacco: Never Used  . Alcohol Use: Yes    Review of Systems Constitutional: No fever/chills Eyes: No visual changes. ENT: No sore  throat. Cardiovascular: Denies chest pain. Respiratory: Denies shortness of breath. Gastrointestinal: No abdominal pain.  No nausea, no vomiting.  No diarrhea.  No constipation. Genitourinary: Negative for dysuria. Musculoskeletal: Negative for back pain. Skin: Negative for rash. Neurological: Headache and dizziness, syncope  10-point ROS otherwise negative.  ____________________________________________   PHYSICAL EXAM:  VITAL SIGNS: ED Triage Vitals  Enc Vitals Group     BP 12/29/14 2348 121/75 mmHg     Pulse Rate 12/29/14 2348 68     Resp 12/29/14 2348 20     Temp 12/29/14 2348 98.7 F (37.1 C)     Temp Source 12/29/14 2348 Oral     SpO2 12/29/14 2348 98 %     Weight 12/29/14 2348 146 lb (66.225 kg)     Height 12/29/14 2348 5\' 6"  (1.676 m)     Head Cir --      Peak Flow --      Pain Score 12/29/14 2350 4     Pain Loc --      Pain Edu? --      Excl. in [place]? --     Constitutional: Alert and oriented. Well appearing and in no acute distress. Eyes: Conjunctivae are normal. PERRL. EOMI. Head: Atraumatic. Nose: No congestion/rhinnorhea. Mouth/Throat: Mucous membranes are moist.  Oropharynx non-erythematous. Cardiovascular: Normal rate, regular rhythm. Grossly normal heart sounds.  Good peripheral circulation. Respiratory: Normal respiratory effort.  No retractions.  Lungs CTAB. Gastrointestinal: Soft and nontender. No distention. Positive bowel sounds Genitourinary: Deferred Musculoskeletal: No lower extremity tenderness nor edema.   Neurologic:  Normal speech and language. No gross focal neurologic deficits are appreciated.  Skin:  Skin is warm, dry and intact. No rash noted. Psychiatric: Mood and affect are normal.   ____________________________________________   LABS (all labs ordered are listed, but only abnormal results are displayed)  Labs Reviewed  BASIC METABOLIC PANEL - Abnormal; Notable for the following:    Potassium 3.3 (*)    Glucose, Bld 102 (*)     All other components within normal limits  URINALYSIS COMPLETEWITH MICROSCOPIC ([facility] ONLY) - Abnormal; Notable for the following:    Color, Urine YELLOW (*)    APPearance HAZY (*)    Ketones, ur 1+ (*)    Hgb urine dipstick 1+ (*)    Protein, ur 30 (*)    Leukocytes, UA 1+ (*)    Bacteria, UA RARE (*)    Squamous Epithelial / LPF 6-30 (*)    All other components within normal limits  CBC  TROPONIN I  POC URINE PREG, ED  POCT PREGNANCY, URINE  CBG MONITORING, ED   ____________________________________________  EKG  ED ECG REPORT I, Loney Hering, the attending physician, personally viewed and interpreted this ECG.   Date: 12/30/2014  EKG Time: 0001  Rate: 61  Rhythm: normal EKG, normal sinus rhythm, with sinus arrhythmia  Axis: Normal  Intervals:none  ST&T Change: None  ____________________________________________  RADIOLOGY  CT head: No acute intracranial process on this mildly motion degraded exam ____________________________________________   PROCEDURES  Procedure(s) performed: None  Critical Care performed: No  ____________________________________________   INITIAL IMPRESSION / ASSESSMENT AND PLAN / ED COURSE  Pertinent labs & imaging results that were available during my care of the patient were reviewed by me and considered in my medical decision making (see chart for details).  This is a 29 year old female who had 2 episodes of syncope earlier this evening. The patient reports that she did hit the back of her head and has a mild headache. The patient's blood work is unremarkable but I will do a CT scan to evaluate the patient's head injury. I will then reassess the patient once I received the results.  The patient's blood work is unremarkable, her CT scan is negative and her orthostatics are also unremarkable. The patient did receive a liter of normal saline. She'll be discharged home to follow-up with [facility] acute care  clinic. ____________________________________________   FINAL CLINICAL IMPRESSION(S) / ED DIAGNOSES  Final diagnoses:  Syncope, unspecified syncope type      Loney Hering, MD 12/30/14 [phone]

## 2014-12-30 NOTE — Discharge Instructions (Signed)
Syncope °Syncope is a medical term for fainting or passing out. This means you lose consciousness and drop to the ground. People are generally unconscious for less than 5 minutes. You may have some muscle twitches for up to 15 seconds before waking up and returning to normal. Syncope occurs more often in older adults, but it can happen to anyone. While most causes of syncope are not dangerous, syncope can be a sign of a serious medical problem. It is important to seek medical care.  °CAUSES  °Syncope is caused by a sudden drop in blood flow to the brain. The specific cause is often not determined. Factors that can bring on syncope include: °· Taking medicines that lower blood pressure. °· Sudden changes in posture, such as standing up quickly. °· Taking more medicine than prescribed. °· Standing in one place for too long. °· Seizure disorders. °· Dehydration and excessive exposure to heat. °· Low blood sugar (hypoglycemia). °· Straining to have a bowel movement. °· Heart disease, irregular heartbeat, or other circulatory problems. °· Fear, emotional distress, seeing blood, or severe pain. °SYMPTOMS  °Right before fainting, you may: °· Feel dizzy or light-headed. °· Feel nauseous. °· See all white or all black in your field of vision. °· Have cold, clammy skin. °DIAGNOSIS  °Your health care provider will ask about your symptoms, perform a physical exam, and perform an electrocardiogram (ECG) to record the electrical activity of your heart. Your health care provider may also perform other heart or blood tests to determine the cause of your syncope which may include: °· Transthoracic echocardiogram (TTE). During echocardiography, sound waves are used to evaluate how blood flows through your heart. °· Transesophageal echocardiogram (TEE). °· Cardiac monitoring. This allows your health care provider to monitor your heart rate and rhythm in real time. °· Holter monitor. This is a portable device that records your  heartbeat and can help diagnose heart arrhythmias. It allows your health care provider to track your heart activity for several days, if needed. °· Stress tests by exercise or by giving medicine that makes the heart beat faster. °TREATMENT  °In most cases, no treatment is needed. Depending on the cause of your syncope, your health care provider may recommend changing or stopping some of your medicines. °HOME CARE INSTRUCTIONS °· Have someone stay with you until you feel stable. °· Do not drive, use machinery, or play sports until your health care provider says it is okay. °· Keep all follow-up appointments as directed by your health care provider. °· Lie down right away if you start feeling like you might faint. Breathe deeply and steadily. Wait until all the symptoms have passed. °· Drink enough fluids to keep your urine clear or pale yellow. °· If you are taking blood pressure or heart medicine, get up slowly and take several minutes to sit and then stand. This can reduce dizziness. °SEEK IMMEDIATE MEDICAL CARE IF:  °· You have a severe headache. °· You have unusual pain in the chest, abdomen, or back. °· You are bleeding from your mouth or rectum, or you have black or tarry stool. °· You have an irregular or very fast heartbeat. °· You have pain with breathing. °· You have repeated fainting or seizure-like jerking during an episode. °· You faint when sitting or lying down. °· You have confusion. °· You have trouble walking. °· You have severe weakness. °· You have vision problems. °If you fainted, call your local emergency services (911 in [place] Do not drive   yourself to the hospital.  °MAKE SURE YOU: °· Understand these instructions. °· Will watch your condition. °· Will get help right away if you are not doing well or get worse. °Document Released: 05/03/2005 Document Revised: 05/08/2013 Document Reviewed: 07/02/2011 °Exit[employer]® Patient Information ©2015 [employer]. This information is not intended to replace  advice given to you by your health care provider. Make sure you discuss any questions you have with your health care provider. ° °

## 2015-01-08 DIAGNOSIS — Z811 Family history of alcohol abuse and dependence: Secondary | ICD-10-CM | POA: Insufficient documentation

## 2015-01-08 HISTORY — DX: Family history of alcohol abuse and dependence: Z81.1

## 2015-01-24 ENCOUNTER — Telehealth: Payer: Self-pay | Admitting: Cardiovascular Disease

## 2015-01-24 ENCOUNTER — Encounter: Payer: Self-pay | Admitting: Emergency Medicine

## 2015-01-24 ENCOUNTER — Emergency Department
Admission: EM | Admit: 2015-01-24 | Discharge: 2015-01-24 | Disposition: A | Discharge status: Home / Self Care | Payer: Self-pay | Attending: Emergency Medicine | Admitting: Emergency Medicine

## 2015-01-24 ENCOUNTER — Emergency Department: Payer: Self-pay

## 2015-01-24 DIAGNOSIS — N390 Urinary tract infection, site not specified: Secondary | ICD-10-CM

## 2015-01-24 DIAGNOSIS — R55 Syncope and collapse: Secondary | ICD-10-CM

## 2015-01-24 DIAGNOSIS — Z3202 Encounter for pregnancy test, result negative: Secondary | ICD-10-CM | POA: Insufficient documentation

## 2015-01-24 LAB — URINALYSIS COMPLETE WITH MICROSCOPIC (ARMC ONLY)
Bilirubin Urine: NEGATIVE
Glucose, UA: NEGATIVE mg/dL
Ketones, ur: NEGATIVE mg/dL
NITRITE: POSITIVE — AB
PH: 6 (ref 5.0–8.0)
PROTEIN: NEGATIVE mg/dL
SPECIFIC GRAVITY, URINE: 1.014 (ref 1.005–1.030)

## 2015-01-24 LAB — COMPREHENSIVE METABOLIC PANEL
ALBUMIN: 4.2 g/dL (ref 3.5–5.0)
ALT: 15 U/L (ref 14–54)
AST: 16 U/L (ref 15–41)
Alkaline Phosphatase: 66 U/L (ref 38–126)
Anion gap: 6 (ref 5–15)
BILIRUBIN TOTAL: 1.1 mg/dL (ref 0.3–1.2)
BUN: 15 mg/dL (ref 6–20)
CHLORIDE: 106 mmol/L (ref 101–111)
CO2: 26 mmol/L (ref 22–32)
CREATININE: 0.81 mg/dL (ref 0.44–1.00)
Calcium: 8.6 mg/dL — ABNORMAL LOW (ref 8.9–10.3)
GFR calc Af Amer: >60 mL/min (ref >60)
GFR calc non Af Amer: >60 mL/min (ref >60)
GLUCOSE: 103 mg/dL — AB (ref 65–99)
POTASSIUM: 3.9 mmol/L (ref 3.5–5.1)
Sodium: 138 mmol/L (ref 135–145)
Total Protein: 7.7 g/dL (ref 6.5–8.1)

## 2015-01-24 LAB — CBC WITH DIFFERENTIAL/PLATELET
BASOS ABS: 0 10*3/uL (ref 0–0.1)
BASOS PCT: 0 %
Eosinophils Absolute: 0 10*3/uL (ref 0–0.7)
Eosinophils Relative: 0 %
HEMATOCRIT: 38.4 % (ref 35.0–47.0)
Hemoglobin: 13 g/dL (ref 12.0–16.0)
Lymphocytes Relative: 9 %
Lymphs Abs: 1.1 10*3/uL (ref 1.0–3.6)
MCH: 30.9 pg (ref 26.0–34.0)
MCHC: 33.9 g/dL (ref 32.0–36.0)
MCV: 91.1 fL (ref 80.0–100.0)
MONO ABS: 0.8 10*3/uL (ref 0.2–0.9)
Monocytes Relative: 7 %
NEUTROS ABS: 10.1 10*3/uL — AB (ref 1.4–6.5)
NEUTROS PCT: 84 %
Platelets: 190 10*3/uL (ref 150–440)
RBC: 4.22 MIL/uL (ref 3.80–5.20)
RDW: 12.3 % (ref 11.5–14.5)
WBC: 12.1 10*3/uL — AB (ref 3.6–11.0)

## 2015-01-24 LAB — CK: CK TOTAL: 116 U/L (ref 38–234)

## 2015-01-24 LAB — PROTIME-INR
INR: 1.13
Prothrombin Time: 14.7 seconds (ref 11.4–15.0)

## 2015-01-24 MED ORDER — ACETAMINOPHEN 500 MG PO TABS
1000.0000 mg | ORAL_TABLET | ORAL | Status: AC
  Administered 2015-01-24: 1000 mg via ORAL
  Filled 2015-01-24: qty 2

## 2015-01-24 MED ORDER — IOHEXOL 350 MG/ML SOLN
100.0000 mL | Freq: Once | INTRAVENOUS | Status: AC | PRN
  Administered 2015-01-24: 75 mL via INTRAVENOUS

## 2015-01-24 MED ORDER — SULFAMETHOXAZOLE-TRIMETHOPRIM 400-80 MG PO TABS
1.0000 | ORAL_TABLET | Freq: Two times a day (BID) | ORAL | Status: DC
Start: 2015-01-24 — End: 2017-06-13

## 2015-01-24 MED ORDER — SODIUM CHLORIDE 0.9 % IV BOLUS (SEPSIS)
1000.0000 mL | Freq: Once | INTRAVENOUS | Status: AC
  Administered 2015-01-24: 1000 mL via INTRAVENOUS

## 2015-01-24 MED ORDER — SULFAMETHOXAZOLE-TRIMETHOPRIM 800-160 MG PO TABS
1.0000 | ORAL_TABLET | Freq: Once | ORAL | Status: AC
  Administered 2015-01-24: 1 via ORAL
  Filled 2015-01-24: qty 1

## 2015-01-24 NOTE — ED Notes (Signed)
Pt returned from CT, partitions set up, family at bedside, pt resting in bed

## 2015-01-24 NOTE — ED Notes (Signed)
Patient to ED with report of generalized body aches this morning. Patient reports she felt like she was having a panic attack and may have passed out. Patient reports nosebleed while driving.

## 2015-01-24 NOTE — ED Notes (Signed)
Patient tearful in hallway. States she feels as though her rights have been violated. Patient states, "I'm about to rip out this IV and leave. This is crazy. I have already called my lawyer and left a voice mail". Privacy options dicussed with patient (coming closer to speak with patient, lowering voice, ect). Patient avoiding eye contact. Patient educated and updated on plan of care. Voiced understanding. Will continue to monitor.

## 2015-01-24 NOTE — ED Provider Notes (Addendum)
[facility] Emergency Department Provider Note REMINDER - THIS NOTE IS NOT A FINAL MEDICAL RECORD UNTIL IT IS SIGNED. UNTIL THEN, THE CONTENT BELOW MAY REFLECT INFORMATION FROM A DOCUMENTATION TEMPLATE, NOT THE ACTUAL PATIENT VISIT. ____________________________________________  Time seen: Approximately 12:29 PM  I have reviewed the triage vital signs and the nursing notes.   HISTORY  Chief Complaint Epistaxis; Generalized Body Aches; and Fever    HPI Kaitlyn Turner is a 29 y.o. 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 She reports that for the last months she's been passing out frequently, up to 4 times per day. She reports that she was suddenly just get very lightheaded, she feels panicky, then feels like things closing in on her and she will awaken on the ground and oriented. She has been witnessed by other people and she has not had any seizure-like activity, was awoken oriented on the ground.  Today she reports that she got done with school, drove home and then passed out for a very brief period of time when getting out of her car. She denies injury headache or neck pain. She reports that this is a same type of passing out she is been experiencing for several times a week for at least the last 1 month. It is not associated with palpitations.   Patient does report that she's been having some generalized muscle aches, and occasionally having sharp pains in her chest but she reports that these are somewhat chronic since the time she had her previous blood clot. She denies having severe chest pain, denies shortness of breath. She presently denies any pain in her chest.  Past Medical History  Diagnosis Date  . Pulmonary embolism     There are no active problems to display for this patient.   History reviewed. No pertinent past surgical history.  Current Outpatient Rx  Name   Route  Sig  Dispense  Refill  . sulfamethoxazole-trimethoprim (BACTRIM DS) 800-160 MG per tablet   Oral   Take 1 tablet by mouth 2 (two) times daily.   20 tablet   20     Allergies Keflex; Other; Toradol; and Tramadol  History reviewed. No pertinent family history.  Social History Social History  Substance Use Topics  . Smoking status: Never Smoker   . Smokeless tobacco: Never Used  . Alcohol Use: Yes    Review of Systems Constitutional: Chills for about a week Eyes: No visual changes. ENT: No sore throat. No neck pain. No headache. Cardiovascular: See history of present illness Respiratory: Denies shortness of breath. Gastrointestinal: No abdominal pain.  No nausea, no vomiting.  No diarrhea.  No constipation. Genitourinary: Negative for dysuria. Denies pregnancy. No vaginal discharge. No pelvic pain. Musculoskeletal: Negative for back pain. Skin: Negative for rash. No rash. Neurological: Negative for headaches, focal weakness or numbness.  10-point ROS otherwise negative.  ____________________________________________   PHYSICAL EXAM:  VITAL SIGNS: ED Triage Vitals  Enc Vitals Group     BP 01/24/15 1213 127/77 mmHg     Pulse Rate 01/24/15 1213 107     Resp 01/24/15 1213 20     Temp 01/24/15 1213 100.5 F (38.1 C)     Temp Source 01/24/15 1213 Oral     SpO2 01/24/15 1213 98 %     Weight 01/24/15 1213 153 lb (69.4 kg)     Height 01/24/15 1213 5\' 6"  (1.676 m)     Head  Cir --      Peak Flow --      Pain Score 01/24/15 1214 4     Pain Loc --      Pain Edu? --      Excl. in [place]? --    Constitutional: Alert and oriented. Well appearing and in no acute distress. Eyes: Conjunctivae are normal. PERRL. EOMI. Head: Atraumatic. Nose: No congestion/rhinnorhea. Mouth/Throat: Mucous membranes are moist.  Oropharynx non-erythematous. Normal tonsils. No exudates or edema. Neck: No stridor.   Cardiovascular: Normal rate, regular rhythm. Grossly normal heart sounds.   Good peripheral circulation. Respiratory: Normal respiratory effort.  No retractions. Lungs CTAB. Gastrointestinal: Soft and nontender. No distention. No abdominal bruits. No CVA tenderness. Musculoskeletal: No lower extremity tenderness nor edema.  No joint effusions. Neurologic:  Normal speech and language. No gross focal neurologic deficits are appreciated. No gait instability. Normal cranial nerve exam. 5 out of 5 movement and strength with normal sensation in all extremities. Skin:  Skin is warm, dry and intact. No rash noted. Psychiatric: Mood and affect are normal. Speech and behavior are normal.  ____________________________________________   LABS (all labs ordered are listed, but only abnormal results are displayed)  Labs Reviewed  CBC WITH DIFFERENTIAL/PLATELET - Abnormal; Notable for the following:    WBC 12.1 (*)    Neutro Abs 10.1 (*)    All other components within normal limits  COMPREHENSIVE METABOLIC PANEL - Abnormal; Notable for the following:    Glucose, Bld 103 (*)    Calcium 8.6 (*)    All other components within normal limits  URINALYSIS COMPLETEWITH MICROSCOPIC ([facility] ONLY) - Abnormal; Notable for the following:    Color, Urine YELLOW (*)    APPearance HAZY (*)    Hgb urine dipstick 1+ (*)    Nitrite POSITIVE (*)    Leukocytes, UA 3+ (*)    Bacteria, UA FEW (*)    Squamous Epithelial / LPF 0-5 (*)    All other components within normal limits  PROTIME-INR  CK  POC URINE PREG, ED   ____________________________________________  EKG  ED ECG REPORT I, QUALE, MARK, the attending physician, personally viewed and interpreted this ECG.  Date: 01/24/2015 EKG Time: 13 19 Rate: 70 Rhythm: normal sinus rhythm QRS Axis: normal Intervals: normal ST/T Wave abnormalities: normal Conduction Disutrbances: none Narrative Interpretation:  unremarkable  ____________________________________________  RADIOLOGY   ____________________________________________   PROCEDURES  Procedure(s) performed: None  Critical Care performed: No  ____________________________________________   INITIAL IMPRESSION / ASSESSMENT AND PLAN / ED COURSE  Pertinent labs & imaging results that were available during my care of the patient were reviewed by me and considered in my medical decision making (see chart for details).  Somewhat unusual presentation of myalgias and generalized fatigue for about a week with associated body aches. Not associated with a known fever, but does have a low-grade fever at triage. She does report that she passed out today, and has passed out several times daily over the last months. The etiology of this is not clear to me, but I am concerned about her previous history of pulmonary embolism as a possible factor if she were to have recurrent pulmonary embolism. She is denying any significant chest pain except for chronic pain she describes for the last year plus since she had a prior clot. No leg swelling, no clinical exam evidence to suggest pulmonary embolism.  In consideration of her symptoms all G, I suspect she may be slightly dehydrated, alternative consideration and possible  associated factors include mild viral illness, mild infectious etiology and we will check urinalysis. She has no abdominal pain. She is completely neurologically intact. She denies cough or shortness of breath. If the remainder of her exam doesn't have a good explanation, I would consider repeat CT imaging of the chest to evaluate for recurrent pulmonary embolism in the setting of her multiple syncopal episodes. She was seen about a month ago and had a CT of the head that was normal at that time as well. This somewhat odd in slightly perplexing presentation, but it is reassuring that she has had multiple episodes where she passed out but has not  suffered any significant injury and continues to be completely neurologically intact. Very unclear exactly what is going on. ____________________________________________  ----------------------------------------- 3:29 PM on 01/24/2015 -----------------------------------------  Urinalysis returned with multiple white cells, consistent with urinary tract infection which likely explains the patient's myalgias and fever. I do not believe this explains her multiple episodes of reported syncope over the last month, and I have discussed with Dr. Rockey Situ of cardiology and we will have her follow-up closely with them early next week. Patient is agreeable with this plan and was close return precautions. I'll discharge her on Bactrim. I did discuss with the patient careful return precautions and she is agreeable with this plan. Family driving her home.  Dr. Rockey Situ reviewed 12-lead, clinical history with me and advises outpatient follow-up. His clinic will call her Monday to setup appointment and holter monitor.  I advised patient she is not to drive.  ----------------------------------------- 3:30 PM on 01/24/2015 -----------------------------------------  Dr. Edd Fabian will follow-up on CT, if negative for pulmonary embolism then we'll advise discharge with antibiotic's as I have prescribed and follow-up with cardiology.  FINAL CLINICAL IMPRESSION(S) / ED DIAGNOSES  Final diagnoses:  Syncope  Urinary tract infection, acute      Delman Kitten, MD 01/24/15 [place], MD 01/24/15 1535  Patient reports that she is upset that she is a hallway bed, I did discuss with her that unfortunately due to acuity of another patient and we did do to move her to a hallway at least temporarily. She is very upset about this, and I have discussed with the charge nurse who is also notified patient relations.  Delman Kitten, MD 01/24/15 [phone]

## 2015-01-24 NOTE — Telephone Encounter (Signed)
Patient was seen in the emergency room She reported having syncope We will schedule in cardiology clinic for further evaluation May need outpatient  Holter monitor  Also has urinary tract infection which will be treated  Signed Esmond Plants, MD  [place]

## 2015-01-24 NOTE — ED Notes (Signed)
Pt taken to family consult room for discharge

## 2015-01-24 NOTE — ED Notes (Signed)
Dr. Edd Fabian notified of pt awaiting discharge, asking what plan is, pt notified Dr. Edd Fabian said "10-49mins", pt states understanding

## 2015-01-24 NOTE — ED Notes (Signed)
Urine Preg Negative

## 2015-01-24 NOTE — ED Notes (Addendum)
Patient's visitor approached nurse's desk stating patient was c/o being in the hallway bed and that she is naked.  Patient currently wrapped in blanket and has hospital gown on. Blinders placed around patient for privacy. Asked patient if that was better, but patient did not respond.

## 2015-01-24 NOTE — ED Notes (Signed)
Pt placed in 19H, critical emergency pt placed in 24

## 2015-01-24 NOTE — ED Notes (Signed)
PT angry she is in hallway, Agricultural consultant notified, pt relations notified

## 2015-01-24 NOTE — Discharge Instructions (Signed)
No driving until cleared by cardiology. Follow-up with cardiology early next week, please call Monday morning for an appointment. Return to the emergency room right away if you pass out again, you do not feel her symptoms are improving with antibiotic's, you have severe abdominal pain, developed weakness, or other new concerns arise.  Urinary Tract Infection Urinary tract infections (UTIs) can develop anywhere along your urinary tract. Your urinary tract is your body's drainage system for removing wastes and extra water. Your urinary tract includes two kidneys, two ureters, a bladder, and a urethra. Your kidneys are a pair of bean-shaped organs. Each kidney is about the size of your fist. They are located below your ribs, one on each side of your spine. CAUSES Infections are caused by microbes, which are microscopic organisms, including fungi, viruses, and bacteria. These organisms are so small that they can only be seen through a microscope. Bacteria are the microbes that most commonly cause UTIs. SYMPTOMS  Symptoms of UTIs may vary by age and gender of the patient and by the location of the infection. Symptoms in young women typically include a frequent and intense urge to urinate and a painful, burning feeling in the bladder or urethra during urination. Older women and men are more likely to be tired, shaky, and weak and have muscle aches and abdominal pain. A fever may mean the infection is in your kidneys. Other symptoms of a kidney infection include pain in your back or sides below the ribs, nausea, and vomiting. DIAGNOSIS To diagnose a UTI, your caregiver will ask you about your symptoms. Your caregiver also will ask to provide a urine sample. The urine sample will be tested for bacteria and white blood cells. White blood cells are made by your body to help fight infection. TREATMENT  Typically, UTIs can be treated with medication. Because most UTIs are caused by a bacterial infection, they usually  can be treated with the use of antibiotics. The choice of antibiotic and length of treatment depend on your symptoms and the type of bacteria causing your infection. HOME CARE INSTRUCTIONS  If you were prescribed antibiotics, take them exactly as your caregiver instructs you. Finish the medication even if you feel better after you have only taken some of the medication.  Drink enough water and fluids to keep your urine clear or pale yellow.  Avoid caffeine, tea, and carbonated beverages. They tend to irritate your bladder.  Empty your bladder often. Avoid holding urine for long periods of time.  Empty your bladder before and after sexual intercourse.  After a bowel movement, women should cleanse from front to back. Use each tissue only once. SEEK MEDICAL CARE IF:   You have back pain.  You develop a fever.  Your symptoms do not begin to resolve within 3 days. SEEK IMMEDIATE MEDICAL CARE IF:   You have severe back pain or lower abdominal pain.  You develop chills.  You have nausea or vomiting.  You have continued burning or discomfort with urination. MAKE SURE YOU:   Understand these instructions.  Will watch your condition.  Will get help right away if you are not doing well or get worse. Document Released: 02/10/2005 Document Revised: 11/02/2011 Document Reviewed: 06/11/2011 [facility] Patient Information 2015 [place], [place]. This information is not intended to replace advice given to you by your health care provider. Make sure you discuss any questions you have with your health care provider.  Syncope Syncope is a medical term for fainting or passing out. This  means you lose consciousness and drop to the ground. People are generally unconscious for less than 5 minutes. You may have some muscle twitches for up to 15 seconds before waking up and returning to normal. Syncope occurs more often in older adults, but it can happen to anyone. While most causes of syncope are not  dangerous, syncope can be a sign of a serious medical problem. It is important to seek medical care.  CAUSES  Syncope is caused by a sudden drop in blood flow to the brain. The specific cause is often not determined. Factors that can bring on syncope include:  Taking medicines that lower blood pressure.  Sudden changes in posture, such as standing up quickly.  Taking more medicine than prescribed.  Standing in one place for too long.  Seizure disorders.  Dehydration and excessive exposure to heat.  Low blood sugar (hypoglycemia).  Straining to have a bowel movement.  Heart disease, irregular heartbeat, or other circulatory problems.  Fear, emotional distress, seeing blood, or severe pain. SYMPTOMS  Right before fainting, you may:  Feel dizzy or light-headed.  Feel nauseous.  See all white or all black in your field of vision.  Have cold, clammy skin. DIAGNOSIS  Your health care provider will ask about your symptoms, perform a physical exam, and perform an electrocardiogram (ECG) to record the electrical activity of your heart. Your health care provider may also perform other heart or blood tests to determine the cause of your syncope which may include:  Transthoracic echocardiogram (TTE). During echocardiography, sound waves are used to evaluate how blood flows through your heart.  Transesophageal echocardiogram (TEE).  Cardiac monitoring. This allows your health care provider to monitor your heart rate and rhythm in real time.  Holter monitor. This is a portable device that records your heartbeat and can help diagnose heart arrhythmias. It allows your health care provider to track your heart activity for several days, if needed.  Stress tests by exercise or by giving medicine that makes the heart beat faster. TREATMENT  In most cases, no treatment is needed. Depending on the cause of your syncope, your health care provider may recommend changing or stopping some of your  medicines. HOME CARE INSTRUCTIONS  Have someone stay with you until you feel stable.  Do not drive, use machinery, or play sports until your health care provider says it is okay.  Keep all follow-up appointments as directed by your health care provider.  Lie down right away if you start feeling like you might faint. Breathe deeply and steadily. Wait until all the symptoms have passed.  Drink enough fluids to keep your urine clear or pale yellow.  If you are taking blood pressure or heart medicine, get up slowly and take several minutes to sit and then stand. This can reduce dizziness. SEEK IMMEDIATE MEDICAL CARE IF:   You have a severe headache.  You have unusual pain in the chest, abdomen, or back.  You are bleeding from your mouth or rectum, or you have black or tarry stool.  You have an irregular or very fast heartbeat.  You have pain with breathing.  You have repeated fainting or seizure-like jerking during an episode.  You faint when sitting or lying down.  You have confusion.  You have trouble walking.  You have severe weakness.  You have vision problems. If you fainted, call your local emergency services (911 in [place] Do not drive yourself to the hospital.  MAKE SURE YOU:  Understand these  instructions.  Will watch your condition.  Will get help right away if you are not doing well or get worse. Document Released: 05/03/2005 Document Revised: 05/08/2013 Document Reviewed: 07/02/2011 [facility] Patient Information 2015 [place], [place]. This information is not intended to replace advice given to you by your health care provider. Make sure you discuss any questions you have with your health care provider.

## 2015-01-24 NOTE — ED Provider Notes (Signed)
-----------------------------------------   6:02 PM on 01/24/2015 ----------------------------------------- Care was assumed from Dr.Quale at 3:30 PM pending CTA chest. CTA chest negative for PE. Vital signs stable. I discussed all of the patient's results, plan of care, discharge instructions with the patient privately in the family room and she was concerned about her information been discussed while she was in a hallway bed.  She voices understanding. DC with return precautions, antibiotics for UTI. She and her mother at bedside are comfortable with the discharge plan.  IMPRESSION: 1. Negative for acute PE or thoracic aortic dissection. 2. Stable probably benign right nodules as above.  Joanne Gavel, MD 01/24/15 [phone]

## 2015-01-25 LAB — POCT PREGNANCY, URINE: PREG TEST UR: NEGATIVE

## 2015-01-27 NOTE — Telephone Encounter (Signed)
Please schedule for clinic appt.

## 2015-03-03 ENCOUNTER — Encounter: Payer: Self-pay | Admitting: *Deleted

## 2015-03-03 ENCOUNTER — Emergency Department
Admission: EM | Admit: 2015-03-03 | Discharge: 2015-03-03 | Disposition: A | Discharge status: Home / Self Care | Payer: Self-pay | Attending: Emergency Medicine | Admitting: Emergency Medicine

## 2015-03-03 ENCOUNTER — Emergency Department: Payer: Self-pay

## 2015-03-03 DIAGNOSIS — S0031XA Abrasion of nose, initial encounter: Secondary | ICD-10-CM | POA: Insufficient documentation

## 2015-03-03 DIAGNOSIS — Y92009 Unspecified place in unspecified non-institutional (private) residence as the place of occurrence of the external cause: Secondary | ICD-10-CM | POA: Insufficient documentation

## 2015-03-03 DIAGNOSIS — S4992XA Unspecified injury of left shoulder and upper arm, initial encounter: Secondary | ICD-10-CM | POA: Insufficient documentation

## 2015-03-03 DIAGNOSIS — Z792 Long term (current) use of antibiotics: Secondary | ICD-10-CM | POA: Insufficient documentation

## 2015-03-03 DIAGNOSIS — Y998 Other external cause status: Secondary | ICD-10-CM | POA: Insufficient documentation

## 2015-03-03 DIAGNOSIS — S0083XA Contusion of other part of head, initial encounter: Secondary | ICD-10-CM | POA: Insufficient documentation

## 2015-03-03 DIAGNOSIS — Y9389 Activity, other specified: Secondary | ICD-10-CM | POA: Insufficient documentation

## 2015-03-03 DIAGNOSIS — S20419A Abrasion of unspecified back wall of thorax, initial encounter: Secondary | ICD-10-CM | POA: Insufficient documentation

## 2015-03-03 DIAGNOSIS — T148XXA Other injury of unspecified body region, initial encounter: Secondary | ICD-10-CM

## 2015-03-03 HISTORY — DX: Hypotension, unspecified: I95.9

## 2015-03-03 MED ORDER — OXYCODONE-ACETAMINOPHEN 5-325 MG PO TABS
1.0000 | ORAL_TABLET | Freq: Once | ORAL | Status: AC
  Administered 2015-03-03: 1 via ORAL
  Filled 2015-03-03: qty 1

## 2015-03-03 MED ORDER — OXYCODONE-ACETAMINOPHEN 5-325 MG PO TABS: 1.0000 | ORAL_TABLET | Freq: Four times a day (QID) | ORAL | Status: DC | PRN
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
## 2015-03-03 NOTE — Discharge Instructions (Signed)
General Assault  Assault includes any behavior or physical attack--whether it is on purpose or not--that results in injury to another person, damage to property, or both. This also includes assault that has not yet happened, but is planned to happen. Threats of assault may be physical, verbal, or written. They may be said or sent by:   Mail.   E-mail.   Text.   Social media.   Fax.  The threats may be direct, implied, or understood.  WHAT ARE THE DIFFERENT FORMS OF ASSAULT?  Forms of assault include:   Physically assaulting a person. This includes physical threats to inflict physical harm as well as:    Slapping.    Hitting.    Poking.    Kicking.    Punching.    Pushing.   Sexually assaulting a person. Sexual assault is any sexual activity that a person is forced, threatened, or coerced to participate in. It may or may not involve physical contact with the person who is assaulting you. You are sexually assaulted if you are forced to have sexual contact of any kind.   Damaging or destroying a person's assistive equipment, such as glasses, canes, or walkers.   Throwing or hitting objects.   Using or displaying a weapon to harm or threaten someone.   Using or displaying an object that appears to be a weapon in a threatening manner.   Using greater physical size or strength to intimidate someone.   Making intimidating or threatening gestures.   Bullying.   Hazing.   Using language that is intimidating, threatening, hostile, or abusive.   Stalking.   Restraining someone with force.  WHAT SHOULD I DO IF I EXPERIENCE ASSAULT?   Report assaults, threats, and stalking to the police. Call your local emergency services (911 in the [place] if you are in immediate danger or you need medical help.   You can work with a lawyer or an advocate to get legal protection against someone who has assaulted you or threatened you with assault. Protection includes restraining orders and private addresses. Crimes against  you, such as assault, can also be prosecuted through the courts. Laws will vary depending on where you live.     This information is not intended to replace advice given to you by your health care provider. Make sure you discuss any questions you have with your health care provider.     Document Released: 05/03/2005 Document Revised: 05/24/2014 Document Reviewed: 01/18/2014  Elsevier Interactive Patient Education 2016 [employer]
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
## 2015-03-03 NOTE — ED Notes (Signed)
Pt returned to room after discharge w/ c/o feeling faint on the way out. Pt given crackers and water per pt request.

## 2015-03-03 NOTE — ED Notes (Signed)
Icepack to top of head for swelling and pain.
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
## 2015-03-07 ENCOUNTER — Ambulatory Visit: Payer: Self-pay | Admitting: Cardiovascular Disease

## 2015-03-07 ENCOUNTER — Encounter: Payer: Self-pay | Admitting: *Deleted

## 2015-06-03 DIAGNOSIS — Z635 Disruption of family by separation and divorce: Secondary | ICD-10-CM | POA: Insufficient documentation

## 2015-06-03 DIAGNOSIS — F4323 Adjustment disorder with mixed anxiety and depressed mood: Secondary | ICD-10-CM | POA: Insufficient documentation

## 2015-10-19 ENCOUNTER — Emergency Department
Admission: EM | Admit: 2015-10-19 | Discharge: 2015-10-20 | Disposition: A | Discharge status: Home / Self Care | Payer: Self-pay | Attending: Emergency Medicine | Admitting: Emergency Medicine

## 2015-10-19 ENCOUNTER — Emergency Department: Payer: Self-pay

## 2015-10-19 ENCOUNTER — Encounter: Payer: Self-pay | Admitting: Emergency Medicine

## 2015-10-19 DIAGNOSIS — R079 Chest pain, unspecified: Secondary | ICD-10-CM | POA: Insufficient documentation

## 2015-10-19 LAB — CBC
HCT: 38.2 % (ref 35.0–47.0)
HEMOGLOBIN: 13 g/dL (ref 12.0–16.0)
MCH: 31.5 pg (ref 26.0–34.0)
MCHC: 34 g/dL (ref 32.0–36.0)
MCV: 92.6 fL (ref 80.0–100.0)
PLATELETS: 217 10*3/uL (ref 150–440)
RBC: 4.13 MIL/uL (ref 3.80–5.20)
RDW: 12.1 % (ref 11.5–14.5)
WBC: 8.4 10*3/uL (ref 3.6–11.0)

## 2015-10-19 LAB — BASIC METABOLIC PANEL
ANION GAP: 7 (ref 5–15)
BUN: 14 mg/dL (ref 6–20)
CALCIUM: 9.1 mg/dL (ref 8.9–10.3)
CHLORIDE: 105 mmol/L (ref 101–111)
CO2: 27 mmol/L (ref 22–32)
CREATININE: 0.75 mg/dL (ref 0.44–1.00)
GFR calc non Af Amer: >60 mL/min (ref >60)
Glucose, Bld: 93 mg/dL (ref 65–99)
Potassium: 3.5 mmol/L (ref 3.5–5.1)
SODIUM: 139 mmol/L (ref 135–145)

## 2015-10-19 LAB — FIBRIN DERIVATIVES D-DIMER (ARMC ONLY): FIBRIN DERIVATIVES D-DIMER (ARMC): 148 (ref 0–499)

## 2015-10-19 LAB — TROPONIN I: Troponin I: <0.03 ng/mL (ref <0.031)

## 2015-10-19 MED ORDER — ACETAMINOPHEN 325 MG PO TABS
650.0000 mg | ORAL_TABLET | Freq: Once | ORAL | Status: AC
  Administered 2015-10-19: 650 mg via ORAL
  Filled 2015-10-19: qty 2

## 2015-10-19 MED ORDER — GI COCKTAIL ~~LOC~~
30.0000 mL | Freq: Once | ORAL | Status: AC
  Administered 2015-10-19: 30 mL via ORAL
  Filled 2015-10-19: qty 30

## 2015-10-19 NOTE — ED Notes (Addendum)
Hx of PE in past, no risk factors, no hx of smoking, no PO hormones/birth control, no hx of long period of immobility then or now. Pt c/o dyspnea and dizziness, no overt sxs of dyspnea, denies n/v.

## 2015-10-19 NOTE — ED Notes (Signed)
Patient reports symptoms off/on for 2 days.  Patient reports similar symptoms in the past and referred to cardiology but did not follow up.  Patient reports left chest pain, non radiating, described as pressure.  Denies short of breath or diaphoreses, does reports feeling weak.

## 2015-10-20 ENCOUNTER — Emergency Department: Payer: Self-pay

## 2015-10-20 LAB — TROPONIN I: Troponin I: <0.03 ng/mL (ref <0.031)

## 2015-10-20 LAB — POCT PREGNANCY, URINE: Preg Test, Ur: NEGATIVE

## 2015-10-20 MED ORDER — MORPHINE SULFATE (PF) 4 MG/ML IV SOLN
INTRAVENOUS | Status: AC
  Filled 2015-10-20: qty 1

## 2015-10-20 MED ORDER — ONDANSETRON HCL 4 MG/2ML IJ SOLN
INTRAMUSCULAR | Status: AC
  Filled 2015-10-20: qty 2

## 2015-10-20 MED ORDER — OXYCODONE-ACETAMINOPHEN 5-325 MG PO TABS
1.0000 | ORAL_TABLET | Freq: Once | ORAL | Status: AC
  Administered 2015-10-20: 1 via ORAL
  Filled 2015-10-20: qty 1

## 2015-10-20 MED ORDER — ALBUTEROL SULFATE HFA 108 (90 BASE) MCG/ACT IN AERS: 2.0000 | INHALATION_SPRAY | Freq: Four times a day (QID) | RESPIRATORY_TRACT | Status: DC | PRN

## 2015-10-20 MED ORDER — IOPAMIDOL (ISOVUE-370) INJECTION 76%
75.0000 mL | Freq: Once | INTRAVENOUS | Status: AC | PRN
  Administered 2015-10-20: 75 mL via INTRAVENOUS

## 2015-10-20 NOTE — Discharge Instructions (Signed)
Nonspecific Chest Pain  °Chest pain can be caused by many different conditions. There is always a chance that your pain could be related to something serious, such as a heart attack or a blood clot in your lungs. Chest pain can also be caused by conditions that are not life-threatening. If you have chest pain, it is very important to follow up with your health care provider. °CAUSES  °Chest pain can be caused by: °· Heartburn. °· Pneumonia or bronchitis. °· Anxiety or stress. °· Inflammation around your heart (pericarditis) or lung (pleuritis or pleurisy). °· A blood clot in your lung. °· A collapsed lung (pneumothorax). It can develop suddenly on its own (spontaneous pneumothorax) or from trauma to the chest. °· Shingles infection (varicella-zoster virus). °· Heart attack. °· Damage to the bones, muscles, and cartilage that make up your chest wall. This can include: °¨ Bruised bones due to injury. °¨ Strained muscles or cartilage due to frequent or repeated coughing or overwork. °¨ Fracture to one or more ribs. °¨ Sore cartilage due to inflammation (costochondritis). °RISK FACTORS  °Risk factors for chest pain may include: °· Activities that increase your risk for trauma or injury to your chest. °· Respiratory infections or conditions that cause frequent coughing. °· Medical conditions or overeating that can cause heartburn. °· Heart disease or family history of heart disease. °· Conditions or health behaviors that increase your risk of developing a blood clot. °· Having had chicken pox (varicella zoster). °SIGNS AND SYMPTOMS °Chest pain can feel like: °· Burning or tingling on the surface of your chest or deep in your chest. °· Crushing, pressure, aching, or squeezing pain. °· Dull or sharp pain that is worse when you move, cough, or take a deep breath. °· Pain that is also felt in your back, neck, shoulder, or arm, or pain that spreads to any of these areas. °Your chest pain may come and go, or it may stay  constant. °DIAGNOSIS °Lab tests or other studies may be needed to find the cause of your pain. Your health care provider may have you take a test called an ambulatory ECG (electrocardiogram). An ECG records your heartbeat patterns at the time the test is performed. You may also have other tests, such as: °· Transthoracic echocardiogram (TTE). During echocardiography, sound waves are used to create a picture of all of the heart structures and to look at how blood flows through your heart. °· Transesophageal echocardiogram (TEE). This is a more advanced imaging test that obtains images from inside your body. It allows your health care provider to see your heart in finer detail. °· Cardiac monitoring. This allows your health care provider to monitor your heart rate and rhythm in real time. °· Holter monitor. This is a portable device that records your heartbeat and can help to diagnose abnormal heartbeats. It allows your health care provider to track your heart activity for several days, if needed. °· Stress tests. These can be done through exercise or by taking medicine that makes your heart beat more quickly. °· Blood tests. °· Imaging tests. °TREATMENT  °Your treatment depends on what is causing your chest pain. Treatment may include: °· Medicines. These may include: °¨ Acid blockers for heartburn. °¨ Anti-inflammatory medicine. °¨ Pain medicine for inflammatory conditions. °¨ Antibiotic medicine, if an infection is present. °¨ Medicines to dissolve blood clots. °¨ Medicines to treat coronary artery disease. °· Supportive care for conditions that do not require medicines. This may include: °¨ Resting. °¨ Applying heat   or cold packs to injured areas. °¨ Limiting activities until pain decreases. °HOME CARE INSTRUCTIONS °· If you were prescribed an antibiotic medicine, finish it all even if you start to feel better. °· Avoid any activities that bring on chest pain. °· Do not use any tobacco products, including  cigarettes, chewing tobacco, or electronic cigarettes. If you need help quitting, ask your health care provider. °· Do not drink alcohol. °· Take medicines only as directed by your health care provider. °· Keep all follow-up visits as directed by your health care provider. This is important. This includes any further testing if your chest pain does not go away. °· If heartburn is the cause for your chest pain, you may be told to keep your head raised (elevated) while sleeping. This reduces the chance that acid will go from your stomach into your esophagus. °· Make lifestyle changes as directed by your health care provider. These may include: °¨ Getting regular exercise. Ask your health care provider to suggest some activities that are safe for you. °¨ Eating a heart-healthy diet. A registered dietitian can help you to learn healthy eating options. °¨ Maintaining a healthy weight. °¨ Managing diabetes, if necessary. °¨ Reducing stress. °SEEK MEDICAL CARE IF: °· Your chest pain does not go away after treatment. °· You have a rash with blisters on your chest. °· You have a fever. °SEEK IMMEDIATE MEDICAL CARE IF:  °· Your chest pain is worse. °· You have an increasing cough, or you cough up blood. °· You have severe abdominal pain. °· You have severe weakness. °· You faint. °· You have chills. °· You have sudden, unexplained chest discomfort. °· You have sudden, unexplained discomfort in your arms, back, neck, or jaw. °· You have shortness of breath at any time. °· You suddenly start to sweat, or your skin gets clammy. °· You feel nauseous or you vomit. °· You suddenly feel light-headed or dizzy. °· Your heart begins to beat quickly, or it feels like it is skipping beats. °These symptoms may represent a serious problem that is an emergency. Do not wait to see if the symptoms will go away. Get medical help right away. Call your local emergency services (911 in the [place] Do not drive yourself to the hospital. °  °This  information is not intended to replace advice given to you by your health care provider. Make sure you discuss any questions you have with your health care provider. °  °Document Released: 02/10/2005 Document Revised: 05/24/2014 Document Reviewed: 12/07/2013 °Elsevier Interactive Patient Education ©2016 [employer] ° °

## 2015-10-20 NOTE — ED Provider Notes (Signed)
[facility] Emergency Department Provider Note   ____________________________________________  Time seen: Approximately 2311 PM  I have reviewed the triage vital signs and the nursing notes.   HISTORY  Chief Complaint Chest Pain    HPI Kaitlyn Turner is a 30 y.o. female who comes into the hospital today with chest pain. The patient reports that she was at work and started having some chest pain. She reports that she's had it for the past 2 days. The pain has come and gone. It is in the left chest area and is sharp. She reports that she also feels as though something is sitting on her chest. The patient has had some shortness of breath and dizziness but denies sweats nausea or vomiting. She reports that she does feel it more when she takes a deep breath. She reports that it just feels different. She had some chest pain she reports a few weeks ago and was encouraged to follow-up with the cardiologist but she did not go. The patient rates her pain a 7 out of 10 in intensity at this time. She did not take any medicine for pain at home. The patient does have a history of a clot in her lung and she reports that she had this shortness of breath than the dull pain but it was not sharp like it is currently. The patient is here for evaluation.   Past Medical History  Diagnosis Date  . Pulmonary embolism ([place])   . Hypotension     There are no active problems to display for this patient.   History reviewed. No pertinent past surgical history.  Current Outpatient Rx  Name  Route  Sig  Dispense  Refill  . albuterol (PROVENTIL HFA;VENTOLIN HFA) 108 (90 Base) MCG/ACT inhaler   Inhalation   Inhale 2 puffs into the lungs every 6 (six) hours as needed.   1 Inhaler   0   . oxyCODONE-acetaminophen (ROXICET) 5-325 MG tablet   Oral   Take 1 tablet by mouth every 6 (six) hours as needed.   12 tablet   0   . sulfamethoxazole-trimethoprim (BACTRIM) 400-80 MG per  tablet   Oral   Take 1 tablet by mouth 2 (two) times daily.   28 tablet   0     Allergies Keflex; Other; Toradol; and Tramadol  No family history on file.  Social History Social History  Substance Use Topics  . Smoking status: Never Smoker   . Smokeless tobacco: Never Used  . Alcohol Use: Yes     Comment: occasionally    Review of Systems Constitutional: No fever/chills Eyes: No visual changes. ENT: No sore throat. Cardiovascular: chest pain. Respiratory:  shortness of breath. Gastrointestinal: No abdominal pain.  No nausea, no vomiting.  No diarrhea.  No constipation. Genitourinary: Negative for dysuria. Musculoskeletal: Negative for back pain. Skin: Negative for rash. Neurological: Dizziness  10-point ROS otherwise negative.  ____________________________________________   PHYSICAL EXAM:  VITAL SIGNS: ED Triage Vitals  Enc Vitals Group     BP 10/19/15 2206 132/81 mmHg     Pulse Rate 10/19/15 2206 71     Resp 10/19/15 2206 18     Temp 10/19/15 2206 98.1 F (36.7 C)     Temp Source 10/19/15 2206 Oral     SpO2 10/19/15 2206 100 %     Weight 10/19/15 2206 160 lb (72.576 kg)     Height 10/19/15 2206 5\' 6"  (1.676 m)     Head Cir --  Peak Flow --      Pain Score 10/19/15 2137 8     Pain Loc --      Pain Edu? --      Excl. in [place]? --     Constitutional: Alert and oriented. Well appearing and in Mild distress. Eyes: Conjunctivae are normal. PERRL. EOMI. Head: Atraumatic. Nose: No congestion/rhinnorhea. Mouth/Throat: Mucous membranes are moist.  Oropharynx non-erythematous. Cardiovascular: Normal rate, regular rhythm. Grossly normal heart sounds.  Good peripheral circulation. Respiratory: Normal respiratory effort.  No retractions. Lungs CTAB. Mild chest tenderness to palpation Gastrointestinal: Soft and nontender. No distention. Positive bowel sounds Musculoskeletal: No lower extremity tenderness nor edema.   Neurologic:  Normal speech and  language. Skin:  Skin is warm, dry and intact.  Psychiatric: Mood and affect are normal.   ____________________________________________   LABS (all labs ordered are listed, but only abnormal results are displayed)  Labs Reviewed  BASIC METABOLIC PANEL  CBC  TROPONIN I  FIBRIN DERIVATIVES D-DIMER ([facility] ONLY)  TROPONIN I  POC URINE PREG, ED  POCT PREGNANCY, URINE   ____________________________________________  EKG  ED ECG REPORT I, Loney Hering, the attending physician, personally viewed and interpreted this ECG.   Date: 10/19/2015  EKG Time: 2129  Rate: 65  Rhythm: normal sinus rhythm  Axis: Normal  Intervals:none  ST&T Change: None  ____________________________________________  RADIOLOGY  Chest x-ray: No active cardiopulmonary disease  CT Angio Chest: No CT evidence of pulmonary embolism. ____________________________________________   PROCEDURES  Procedure(s) performed: None  Critical Care performed: No  ____________________________________________   INITIAL IMPRESSION / ASSESSMENT AND PLAN / ED COURSE  Pertinent labs & imaging results that were available during my care of the patient were reviewed by me and considered in my medical decision making (see chart for details).  This is a 30 year old female who comes into the hospital today with some chest pain. The patient does have a history of a PE. I did perform a d-dimer which was negative but looking at the patient's EKG she does have an S wave in lead 1 and a Q wave in lead 3. The patient did receive a GI cocktail and some Tylenol but is still saying her pain is a 5 out of 10. I will perform a CT scan to ensure that the patient does not have a PE.   The patient has no PE on her CT. The patient also had a repeat troponin that was unremarkable. I will discharge the patient home and have her follow-up with cardiology. The patient was frustrated as she reports she does not have any answers for her  symptoms. We continued to explain to the patient that she needs to follow-up with the specialist for further evaluation of her symptoms. She will be discharged home. Her pain is improved. ____________________________________________   FINAL CLINICAL IMPRESSION(S) / ED DIAGNOSES  Final diagnoses:  Chest pain, unspecified chest pain type      NEW MEDICATIONS STARTED DURING THIS VISIT:  Discharge Medication List as of 10/20/2015  3:22 AM       Note:  This document was prepared using Dragon voice recognition software and may include unintentional dictation errors.    Loney Hering, MD 10/20/15 [phone]

## 2016-08-03 ENCOUNTER — Encounter: Payer: Self-pay | Admitting: Emergency Medicine

## 2016-08-03 ENCOUNTER — Emergency Department: Payer: Self-pay

## 2016-08-03 ENCOUNTER — Emergency Department
Admission: EM | Admit: 2016-08-03 | Discharge: 2016-08-03 | Disposition: A | Discharge status: Home / Self Care | Payer: Self-pay | Attending: Emergency Medicine | Admitting: Emergency Medicine

## 2016-08-03 DIAGNOSIS — M25511 Pain in right shoulder: Secondary | ICD-10-CM | POA: Insufficient documentation

## 2016-08-03 DIAGNOSIS — Y9239 Other specified sports and athletic area as the place of occurrence of the external cause: Secondary | ICD-10-CM | POA: Insufficient documentation

## 2016-08-03 DIAGNOSIS — X500XXA Overexertion from strenuous movement or load, initial encounter: Secondary | ICD-10-CM | POA: Insufficient documentation

## 2016-08-03 DIAGNOSIS — Z79899 Other long term (current) drug therapy: Secondary | ICD-10-CM | POA: Insufficient documentation

## 2016-08-03 DIAGNOSIS — Y999 Unspecified external cause status: Secondary | ICD-10-CM | POA: Insufficient documentation

## 2016-08-03 DIAGNOSIS — Y93F2 Activity, caregiving, lifting: Secondary | ICD-10-CM | POA: Insufficient documentation

## 2016-08-03 MED ORDER — MELOXICAM 7.5 MG PO TABS: 7.5000 mg | ORAL_TABLET | Freq: Every day | ORAL | 1 refills | Status: AC

## 2016-08-03 MED ORDER — MELOXICAM 7.5 MG PO TABS
7.5000 mg | ORAL_TABLET | Freq: Once | ORAL | Status: AC
  Administered 2016-08-03: 7.5 mg via ORAL
  Filled 2016-08-03: qty 1

## 2016-08-03 NOTE — ED Notes (Signed)
Pt discharged to home.  Family member driving.  Discharge instructions reviewed.  Verbalized understanding.  No questions or concerns at this time.  Teach back verified.  Pt in NAD.  No items left in ED.   

## 2016-08-03 NOTE — ED Triage Notes (Signed)
Patient presents to ED via POV with c/o right shoulder pain x 3 weeks. Patient states the past 3 days it has progressively gotten worse. Patient denies injury. Patient states, "I woke out so maybe that's what going on". Patient states she has tried ice packs and heating pads without relief.

## 2016-08-04 NOTE — ED Provider Notes (Signed)
[facility] Emergency Department Provider Note  ____________________________________________  Time seen: Approximately 4:11 PM  I have reviewed the triage vital signs and the nursing notes.   HISTORY  Chief Complaint Shoulder Pain    HPI Kaitlyn Turner is a 31 y.o. female presenting to the emergency department with right shoulder pain for the past three weeks. Patient states that she has been lifting weights more at the gym and has noticed pain since her physical activity has increased. Patient denies falls or other mechanisms of trauma. Patient denies prior surgeries to the right upper extremity. Patient has not experienced right upper extremity avoidance. She has been taking Tylenol and ibuprofen but no other alleviating measures. Patient has not been under the care of orthopedics. Patient denies chest pain, chest tightness, shortness of breath, abdominal pain, nausea and vomiting.    Past Medical History:  Diagnosis Date  . Hypotension   . Pulmonary embolism ([facility])     There are no active problems to display for this patient.   History reviewed. No pertinent surgical history.  Prior to Admission medications   Medication Sig Start Date End Date Taking? Authorizing Provider  albuterol (PROVENTIL HFA;VENTOLIN HFA) 108 (90 Base) MCG/ACT inhaler Inhale 2 puffs into the lungs every 6 (six) hours as needed. 10/20/15   Loney Hering, MD  meloxicam (MOBIC) 7.5 MG tablet Take 1 tablet (7.5 mg total) by mouth daily. 08/03/16 08/10/16  Lannie Fields, PA-C  oxyCODONE-acetaminophen (ROXICET) 5-325 MG tablet Take 1 tablet by mouth every 6 (six) hours as needed. 03/03/15   Loney Hering, MD  sulfamethoxazole-trimethoprim (BACTRIM) 400-80 MG per tablet Take 1 tablet by mouth 2 (two) times daily. 01/24/15   Delman Kitten, MD    Allergies Keflex [cephalexin]; Other; Toradol [ketorolac tromethamine]; and Tramadol  No family history on file.  Social History Social  History  Substance Use Topics  . Smoking status: Never Smoker  . Smokeless tobacco: Never Used  . Alcohol use Yes     Comment: occasionally    Review of Systems  Constitutional: No fever/chills Eyes: No visual changes. No discharge ENT: No upper respiratory complaints. Cardiovascular: no chest pain. Respiratory: no cough. No SOB. Gastrointestinal: No abdominal pain.  No nausea, no vomiting.  No diarrhea.  No constipation. Musculoskeletal: Patient has right shoulder pain.  Skin: Negative for rash, abrasions, lacerations, ecchymosis. Neurological: Negative for headaches, focal weakness or numbness. ____________________________________________   PHYSICAL EXAM:  VITAL SIGNS: ED Triage Vitals  Enc Vitals Group     BP 08/03/16 1841 133/90     Pulse Rate 08/03/16 1841 65     Resp 08/03/16 1841 18     Temp 08/03/16 1841 99 F (37.2 C)     Temp Source 08/03/16 1841 Oral     SpO2 08/03/16 1841 99 %     Weight 08/03/16 1841 165 lb (74.8 kg)     Height 08/03/16 1841 5\' 6"  (1.676 m)     Head Circumference --      Peak Flow --      Pain Score 08/03/16 1848 8     Pain Loc --      Pain Edu? --      Excl. in [place]? --    Constitutional: Alert and oriented. Well appearing and in no acute distress. Eyes: Conjunctivae are normal. PERRL. EOMI. Head: Atraumatic. Cardiovascular: Normal rate, regular rhythm. Normal S1 and S2.  Good peripheral circulation. Respiratory: Normal respiratory effort without tachypnea or retractions. Lungs CTAB.  Good air entry to the bases with no decreased or absent breath sounds. Musculoskeletal: Patient has 5 out of 5 strength in the upper extremities bilaterally. Patient has full range of motion at the shoulder, elbow and wrist bilaterally and symmetrically. Patient has no radiculopathy or reproducibility of symptoms with range of motion testing at the neck. Right shoulder: No tenderness was elicited with palpation over the lateral deltoid. Patient has mild  tenderness to palpation over the supraspinatus. Patient has tenderness elicited with right rotator cuff testing, but no weakness. Palpable radial and ulnar pulses bilaterally and symmetrically. Neurologic:  Normal speech and language. No gross focal neurologic deficits are appreciated. Reflexes are 2+ and symmetric in the upper extremities bilaterally. Skin:  No erythema or edema the skin overlying the right shoulder. Psychiatric: Mood and affect are normal. Speech and behavior are normal. Patient exhibits appropriate insight and judgement. ____________________________________________   LABS (all labs ordered are listed, but only abnormal results are displayed)  Labs Reviewed - No data to display ____________________________________________  EKG   ____________________________________________  RADIOLOGY Unk Pinto, personally viewed and evaluated these images (plain radiographs) as part of my medical decision making, as well as reviewing the written report by the radiologist.  Dg Shoulder Right  Result Date: 08/03/2016 CLINICAL DATA:  RIGHT shoulder pain for 3 weeks EXAM: RIGHT SHOULDER - 2+ VIEW COMPARISON:  None. FINDINGS: Glenohumeral joint is intact. No evidence of scapular fracture or humeral fracture. The acromioclavicular joint is intact. IMPRESSION: No fracture or dislocation. Electronically Signed   By: Suzy Bouchard M.D.   On: 08/03/2016 19:51    ____________________________________________    PROCEDURES  Procedure(s) performed:    Procedures    Medications  meloxicam (MOBIC) tablet 7.5 mg (7.5 mg Oral Given 08/03/16 2005)     ____________________________________________   INITIAL IMPRESSION / ASSESSMENT AND PLAN / ED COURSE  Pertinent labs & imaging results that were available during my care of the patient were reviewed by me and considered in my medical decision making (see chart for details).  Review of the [place] CSRS was performed in accordance of  the [place] prior to dispensing any controlled drugs.    Assessment and plan: Right Shoulder Pain: Patient presents to the emergency department with right shoulder pain for the past 3 weeks. DG right shoulder reveals no acute fractures or bony abnormalities. On physical exam, patient had tenderness elicited with right rotator cuff testing. Physical exam findings and history are consistent with right rotator cuff tendinitis. Patient was discharged with Mobic. A referral was given to orthopedics, Dr. Mack Guise. Patient was advised to make an appointment in one week if right shoulder pain persists. All patient questions were answered.  ____________________________________________  FINAL CLINICAL IMPRESSION(S) / ED DIAGNOSES  Final diagnoses:  Acute pain of right shoulder      NEW MEDICATIONS STARTED DURING THIS VISIT:  Discharge Medication List as of 08/03/2016  7:57 PM    START taking these medications   Details  meloxicam (MOBIC) 7.5 MG tablet Take 1 tablet (7.5 mg total) by mouth daily., Starting Tue 08/03/2016, Until Tue 08/10/2016, Print            This chart was dictated using voice recognition software/Dragon. Despite best efforts to proofread, errors can occur which can change the meaning. Any change was purely unintentional.    Lannie Fields, PA-C 08/04/16 [place], MD 08/06/16 [phone]

## 2017-06-13 ENCOUNTER — Emergency Department
Admission: EM | Admit: 2017-06-13 | Discharge: 2017-06-13 | Disposition: A | Discharge status: Home / Self Care | Payer: Self-pay | Attending: Emergency Medicine | Admitting: Emergency Medicine

## 2017-06-13 ENCOUNTER — Encounter: Payer: Self-pay | Admitting: Emergency Medicine

## 2017-06-13 ENCOUNTER — Other Ambulatory Visit: Payer: Self-pay

## 2017-06-13 DIAGNOSIS — I959 Hypotension, unspecified: Secondary | ICD-10-CM | POA: Insufficient documentation

## 2017-06-13 DIAGNOSIS — J020 Streptococcal pharyngitis: Secondary | ICD-10-CM | POA: Insufficient documentation

## 2017-06-13 LAB — INFLUENZA PANEL BY PCR (TYPE A & B)
INFLAPCR: NEGATIVE
INFLBPCR: NEGATIVE

## 2017-06-13 LAB — GROUP A STREP BY PCR: GROUP A STREP BY PCR: DETECTED — AB

## 2017-06-13 MED ORDER — AMOXICILLIN 500 MG PO CAPS: 500.0000 mg | ORAL_CAPSULE | Freq: Three times a day (TID) | ORAL | 0 refills | Status: DC

## 2017-06-13 NOTE — ED Triage Notes (Signed)
Here for sore throat, cough, body aches, chills and some intermittent vomiting.  Sx started Saturday.  Has had some people at other job sick but unsure what had.  Vomit X 2 since midnight.  Ambulatory without distress.  VSS. Reports runny nose starting yesterday.

## 2017-06-13 NOTE — Discharge Instructions (Signed)
Begin taking amoxicillin 3 times daily for 10 days.  You may take Tylenol or ibuprofen as needed for throat pain.  Increase fluids.  Follow-up with your PCP or [facility] if any continued problems.

## 2017-06-13 NOTE — ED Notes (Signed)
See triage note  Presents with body aches ,congestion and cough   States she started feeling bad on Saturday  Denies any fever  Did have some intermittent vomiting this am  Afebrile on arrival

## 2017-06-13 NOTE — ED Provider Notes (Signed)
[facility] Emergency Department Provider Note  ____________________________________________   First MD Initiated Contact with Patient 06/13/17 [phone]     (approximate)  I have reviewed the triage vital signs and the nursing notes.   HISTORY  Chief Complaint flu like symptoms   HPI Kaitlyn Turner is a 32 y.o. female is here with complaint of sore throat, body aches, cough, chills, and vomiting.  Patient states that she has been around a lot of people at work that have been sick.  She states that she has vomited twice since midnight.  She denies any urinary symptoms.  Currently she is not taking any over-the-counter medication.  She rates her pain as 7 out of 10.  Past Medical History:  Diagnosis Date  . Hypotension   . Pulmonary embolism (HCC)     There are no active problems to display for this patient.   History reviewed. No pertinent surgical history.  Prior to Admission medications   Medication Sig Start Date End Date Taking? Authorizing Provider  albuterol (PROVENTIL HFA;VENTOLIN HFA) 108 (90 Base) MCG/ACT inhaler Inhale 2 puffs into the lungs every 6 (six) hours as needed. 10/20/15   Loney Hering, MD  amoxicillin (AMOXIL) 500 MG capsule Take 1 capsule (500 mg total) by mouth 3 (three) times daily. 06/13/17   Johnn Hai, PA-C    Allergies Keflex [cephalexin]; Other; Toradol [ketorolac tromethamine]; and Tramadol  History reviewed. No pertinent family history.  Social History Social History   Tobacco Use  . Smoking status: Never Smoker  . Smokeless tobacco: Never Used  Substance Use Topics  . Alcohol use: Yes    Comment: occasionally  . Drug use: No    Review of Systems Constitutional: No fever/positive chills Eyes: No visual changes. ENT: Positive sore throat. Cardiovascular: Denies chest pain. Respiratory: Denies shortness of breath. Gastrointestinal: No abdominal pain.  No nausea, positive vomiting.  No diarrhea.    Genitourinary: Negative for dysuria. Musculoskeletal: Positive for muscle aches. Skin: Negative for rash. Neurological: Negative for headaches, focal weakness or numbness. ___________________________________________   PHYSICAL EXAM:  VITAL SIGNS: ED Triage Vitals  Enc Vitals Group     BP 06/13/17 0934 128/74     Pulse Rate 06/13/17 0934 84     Resp 06/13/17 0934 18     Temp 06/13/17 0934 98.2 F (36.8 C)     Temp Source 06/13/17 0934 Oral     SpO2 06/13/17 0934 96 %     Weight 06/13/17 0931 169 lb (76.7 kg)     Height 06/13/17 0931 5\' 6"  (1.676 m)     Head Circumference --      Peak Flow --      Pain Score 06/13/17 0931 7     Pain Loc --      Pain Edu? --      Excl. in [place]? --    Constitutional: Alert and oriented. Well appearing and in no acute distress. Eyes: Conjunctivae are normal. PERRL. EOMI. Head: Atraumatic. Nose: No congestion/rhinnorhea. Mouth/Throat: Mucous membranes are moist.  Oropharynx erythematous without exudate. Neck: No stridor.   Hematological/Lymphatic/Immunilogical: Minimal bilateral cervical lymphadenopathy. Cardiovascular: Normal rate, regular rhythm. Grossly normal heart sounds.  Good peripheral circulation. Respiratory: Normal respiratory effort.  No retractions. Lungs CTAB. Gastrointestinal: Soft and nontender. No distention.  Musculoskeletal: Moves upper and lower extremities without any difficulty.  Normal gait was noted. Neurologic:  Normal speech and language. No gross focal neurologic deficits are appreciated.  Skin:  Skin is  warm, dry and intact. No rash noted. Psychiatric: Mood and affect are normal. Speech and behavior are normal.  ____________________________________________   LABS (all labs ordered are listed, but only abnormal results are displayed)  Labs Reviewed  GROUP A STREP BY PCR - Abnormal; Notable for the following components:      Result Value   Group A Strep by PCR DETECTED (*)    All other components within normal  limits  INFLUENZA PANEL BY PCR (TYPE A & B)     PROCEDURES  Procedure(s) performed: None  Procedures  Critical Care performed: No  ____________________________________________   INITIAL IMPRESSION / ASSESSMENT AND PLAN / ED COURSE  Patient states she is allergic to Keflex but has taken amoxicillin without any difficulty.  She was given a prescription for 500 mg 3 times daily for 10 days.  She will take Tylenol or ibuprofen as needed for throat pain.  Increase fluids.  She was given a note to remain out of work.  She will follow-up with her PCP if any continued problems.  ____________________________________________   FINAL CLINICAL IMPRESSION(S) / ED DIAGNOSES  Final diagnoses:  Strep pharyngitis     ED Discharge Orders        Ordered    amoxicillin (AMOXIL) 500 MG capsule  3 times daily     06/13/17 1121       Note:  This document was prepared using Dragon voice recognition software and may include unintentional dictation errors.    Johnn Hai, PA-C 06/13/17 1144    Earleen Newport, MD 06/13/17 [phone]

## 2018-03-19 ENCOUNTER — Encounter: Payer: Self-pay | Admitting: Emergency Medicine

## 2018-03-19 ENCOUNTER — Emergency Department
Admission: EM | Admit: 2018-03-19 | Discharge: 2018-03-19 | Disposition: A | Discharge status: Home / Self Care | Payer: Self-pay | Attending: Emergency Medicine | Admitting: Emergency Medicine

## 2018-03-19 DIAGNOSIS — N3001 Acute cystitis with hematuria: Secondary | ICD-10-CM | POA: Insufficient documentation

## 2018-03-19 HISTORY — DX: Malignant hyperthermia due to anesthesia, initial encounter: T88.3XXA

## 2018-03-19 LAB — BASIC METABOLIC PANEL
Anion gap: 8 (ref 5–15)
BUN: 12 mg/dL (ref 6–20)
CALCIUM: 8.6 mg/dL — AB (ref 8.9–10.3)
CHLORIDE: 104 mmol/L (ref 98–111)
CO2: 26 mmol/L (ref 22–32)
CREATININE: 0.67 mg/dL (ref 0.44–1.00)
GFR calc non Af Amer: >60 mL/min (ref >60)
Glucose, Bld: 90 mg/dL (ref 70–99)
Potassium: 3.6 mmol/L (ref 3.5–5.1)
SODIUM: 138 mmol/L (ref 135–145)

## 2018-03-19 LAB — URINALYSIS, COMPLETE (UACMP) WITH MICROSCOPIC
BACTERIA UA: NONE SEEN
BILIRUBIN URINE: NEGATIVE
Glucose, UA: NEGATIVE mg/dL
HGB URINE DIPSTICK: NEGATIVE
KETONES UR: NEGATIVE mg/dL
NITRITE: NEGATIVE
Protein, ur: NEGATIVE mg/dL
SPECIFIC GRAVITY, URINE: 1.006 (ref 1.005–1.030)
pH: 8 (ref 5.0–8.0)

## 2018-03-19 LAB — CBC
HEMATOCRIT: 36.4 % (ref 36.0–46.0)
Hemoglobin: 12.3 g/dL (ref 12.0–15.0)
MCH: 32.4 pg (ref 26.0–34.0)
MCHC: 33.8 g/dL (ref 30.0–36.0)
MCV: 95.8 fL (ref 80.0–100.0)
NRBC: 0 % (ref 0.0–0.2)
PLATELETS: 252 10*3/uL (ref 150–400)
RBC: 3.8 MIL/uL — ABNORMAL LOW (ref 3.87–5.11)
RDW: 11.7 % (ref 11.5–15.5)
WBC: 9.2 10*3/uL (ref 4.0–10.5)

## 2018-03-19 LAB — POCT PREGNANCY, URINE: Preg Test, Ur: NEGATIVE

## 2018-03-19 MED ORDER — SULFAMETHOXAZOLE-TRIMETHOPRIM 800-160 MG PO TABS: 1.0000 | ORAL_TABLET | Freq: Two times a day (BID) | ORAL | 0 refills | Status: DC

## 2018-03-19 MED ORDER — PHENAZOPYRIDINE HCL 200 MG PO TABS: 200.0000 mg | ORAL_TABLET | Freq: Three times a day (TID) | ORAL | 0 refills | Status: DC | PRN

## 2018-03-19 NOTE — ED Provider Notes (Signed)
[facility] Emergency Department Provider Note  ____________________________________________  Time seen: Approximately 5:55 PM  I have reviewed the triage vital signs and the nursing notes.   HISTORY  Chief Complaint Dysuria    HPI Kaitlyn Turner is a 32 y.o. female who presents to the emergency department for treatment and evaluation of urinary frequency and dysuria for the past 3 to 4 days.  She has some chills and felt generally unwell today.  She has frequent urinary tract infections with similar symptoms.   Past Medical History:  Diagnosis Date  . Hypotension   . Malignant hyperthermia   . Pulmonary embolism (HCC)     There are no active problems to display for this patient.   Past Surgical History:  Procedure Laterality Date  . DENTAL SURGERY      Prior to Admission medications   Medication Sig Start Date End Date Taking? Authorizing Provider  albuterol (PROVENTIL HFA;VENTOLIN HFA) 108 (90 Base) MCG/ACT inhaler Inhale 2 puffs into the lungs every 6 (six) hours as needed. 10/20/15   Loney Hering, MD  phenazopyridine (PYRIDIUM) 200 MG tablet Take 1 tablet (200 mg total) by mouth 3 (three) times daily as needed for pain. 03/19/18   Brandun Pinn, Johnette Abraham B, FNP  sulfamethoxazole-trimethoprim (BACTRIM DS,SEPTRA DS) 800-160 MG tablet Take 1 tablet by mouth 2 (two) times daily. 03/19/18   Jolee Critcher, Dessa Phi, FNP    Allergies Keflex [cephalexin]; Other; Toradol [ketorolac tromethamine]; and Tramadol  No family history on file.  Social History Social History   Tobacco Use  . Smoking status: Never Smoker  . Smokeless tobacco: Never Used  Substance Use Topics  . Alcohol use: Yes    Comment: occasionally  . Drug use: No    Review of Systems Constitutional: Negative for fever. Respiratory: Negative for shortness of breath or cough. Gastrointestinal: Positive for abdominal pain; negative for nausea , negative for vomiting. Genitourinary: Positive  for dysuria , negative for vaginal discharge. Musculoskeletal: Positive for back pain. Skin: Negative for acute skin changes/rash/lesion. ____________________________________________   PHYSICAL EXAM:  VITAL SIGNS: ED Triage Vitals  Enc Vitals Group     BP 03/19/18 1518 129/76     Pulse Rate 03/19/18 1518 79     Resp 03/19/18 1518 16     Temp 03/19/18 1518 98.8 F (37.1 C)     Temp Source 03/19/18 1518 Oral     SpO2 03/19/18 1518 100 %     Weight 03/19/18 1520 177 lb (80.3 kg)     Height 03/19/18 1520 5\' 6"  (1.676 m)     Head Circumference --      Peak Flow --      Pain Score 03/19/18 1523 5     Pain Loc --      Pain Edu? --      Excl. in [place]? --     Constitutional: Alert and oriented. Well appearing and in no acute distress. Eyes: Conjunctivae are normal. Head: Atraumatic. Nose: No congestion/rhinnorhea. Mouth/Throat: Mucous membranes are moist. Respiratory: Normal respiratory effort.  No retractions. Gastrointestinal: Bowel sounds active x 4; Abdomen is soft without rebound or guarding. Genitourinary: Pelvic exam: Not indicated Musculoskeletal: No extremity tenderness nor edema.  Mild, bilateral CVA tenderness Neurologic:  Normal speech and language. No gross focal neurologic deficits are appreciated. Speech is normal. No gait instability. Skin:  Skin is warm, dry and intact. No rash noted on exposed skin. Psychiatric: Mood and affect are normal. Speech and behavior are normal.  ____________________________________________  LABS (all labs ordered are listed, but only abnormal results are displayed)  Labs Reviewed  URINALYSIS, COMPLETE (UACMP) WITH MICROSCOPIC - Abnormal; Notable for the following components:      Result Value   Color, Urine STRAW (*)    APPearance HAZY (*)    Leukocytes, UA LARGE (*)    WBC, UA >50 (*)    All other components within normal limits  BASIC METABOLIC PANEL - Abnormal; Notable for the following components:   Calcium 8.6 (*)    All  other components within normal limits  CBC - Abnormal; Notable for the following components:   RBC 3.80 (*)    All other components within normal limits  POCT PREGNANCY, URINE  POC URINE PREG, ED   ____________________________________________  RADIOLOGY  Not indicated ____________________________________________  Procedures  ____________________________________________  32 year old female presenting to the emergency department for treatment and evaluation of symptoms and exam most consistent with early pyelonephritis/acute cystitis.  Patient states that her back started hurting a little bit today but it is "not bad."  She will be treated with a 5-day course of Bactrim and given Pyridium as well.  She is to follow-up with primary care for symptoms that are not improving over the next few days.  She was encouraged to return to the emergency department for symptoms of change or worsen if she is unable to schedule an appointment.  INITIAL IMPRESSION / ASSESSMENT AND PLAN / ED COURSE  Pertinent labs & imaging results that were available during my care of the patient were reviewed by me and considered in my medical decision making (see chart for details).  ____________________________________________   FINAL CLINICAL IMPRESSION(S) / ED DIAGNOSES  Final diagnoses:  Acute cystitis with hematuria    Note:  This document was prepared using Dragon voice recognition software and may include unintentional dictation errors.    Victorino Dike, FNP 03/19/18 1808    Earleen Newport, MD 03/19/18 [phone]

## 2018-03-19 NOTE — ED Triage Notes (Addendum)
Patient presents to the ED with urinary frequency and dysuria x 3-4 days.  Patient states today she had chills and was feeling unwell.  Patient is in no obvious distress at this time.  Patient reports frequent UTIs and states symptoms seem similar.

## 2018-03-19 NOTE — Discharge Instructions (Addendum)
Take the antibiotic until finished.  Follow-up with the primary care provider of your choice for symptoms that are not improving over the next few days.  Return to the emergency department for symptoms that change or worsen if you are unable to schedule an appointment.

## 2018-03-19 NOTE — ED Notes (Signed)
Patient refused discharge vital signs. 

## 2018-04-25 LAB — HM HIV SCREENING LAB: HM HIV Screening: NEGATIVE

## 2018-08-20 ENCOUNTER — Other Ambulatory Visit: Payer: Self-pay

## 2018-08-20 ENCOUNTER — Encounter: Payer: Self-pay | Admitting: Emergency Medicine

## 2018-08-20 ENCOUNTER — Emergency Department
Admission: EM | Admit: 2018-08-20 | Discharge: 2018-08-21 | Disposition: A | Discharge status: Other Acute Inpatient Hospital | Payer: Medicaid Other | Attending: Emergency Medicine | Admitting: Emergency Medicine

## 2018-08-20 DIAGNOSIS — R45851 Suicidal ideations: Secondary | ICD-10-CM | POA: Diagnosis not present

## 2018-08-20 DIAGNOSIS — F10920 Alcohol use, unspecified with intoxication, uncomplicated: Secondary | ICD-10-CM | POA: Diagnosis not present

## 2018-08-20 DIAGNOSIS — F4321 Adjustment disorder with depressed mood: Secondary | ICD-10-CM

## 2018-08-20 DIAGNOSIS — F329 Major depressive disorder, single episode, unspecified: Secondary | ICD-10-CM | POA: Diagnosis not present

## 2018-08-20 DIAGNOSIS — Y906 Blood alcohol level of 120-199 mg/100 ml: Secondary | ICD-10-CM | POA: Diagnosis not present

## 2018-08-20 LAB — URINE DRUG SCREEN, QUALITATIVE (ARMC ONLY)
Amphetamines, Ur Screen: NOT DETECTED
Barbiturates, Ur Screen: NOT DETECTED
Benzodiazepine, Ur Scrn: NOT DETECTED
Cannabinoid 50 Ng, Ur ~~LOC~~: NOT DETECTED
Cocaine Metabolite,Ur ~~LOC~~: NOT DETECTED
MDMA (Ecstasy)Ur Screen: NOT DETECTED
Methadone Scn, Ur: NOT DETECTED
Opiate, Ur Screen: NOT DETECTED
Phencyclidine (PCP) Ur S: NOT DETECTED
Tricyclic, Ur Screen: NOT DETECTED

## 2018-08-20 LAB — COMPREHENSIVE METABOLIC PANEL
ALT: 15 U/L (ref 0–44)
AST: 17 U/L (ref 15–41)
Albumin: 4.5 g/dL (ref 3.5–5.0)
Alkaline Phosphatase: 65 U/L (ref 38–126)
Anion gap: 9 (ref 5–15)
BUN: 19 mg/dL (ref 6–20)
CO2: 21 mmol/L — ABNORMAL LOW (ref 22–32)
Calcium: 8.5 mg/dL — ABNORMAL LOW (ref 8.9–10.3)
Chloride: 111 mmol/L (ref 98–111)
Creatinine, Ser: 0.8 mg/dL (ref 0.44–1.00)
GFR calc Af Amer: >60 mL/min (ref >60)
GFR calc non Af Amer: >60 mL/min (ref >60)
Glucose, Bld: 105 mg/dL — ABNORMAL HIGH (ref 70–99)
Potassium: 4 mmol/L (ref 3.5–5.1)
Sodium: 141 mmol/L (ref 135–145)
Total Bilirubin: 0.6 mg/dL (ref 0.3–1.2)
Total Protein: 8 g/dL (ref 6.5–8.1)

## 2018-08-20 LAB — SALICYLATE LEVEL: Salicylate Lvl: <7 mg/dL (ref 2.8–30.0)

## 2018-08-20 LAB — PREGNANCY, URINE: Preg Test, Ur: NEGATIVE

## 2018-08-20 LAB — CBC
HCT: 41.1 % (ref 36.0–46.0)
Hemoglobin: 13.9 g/dL (ref 12.0–15.0)
MCH: 32.3 pg (ref 26.0–34.0)
MCHC: 33.8 g/dL (ref 30.0–36.0)
MCV: 95.4 fL (ref 80.0–100.0)
Platelets: 260 10*3/uL (ref 150–400)
RBC: 4.31 MIL/uL (ref 3.87–5.11)
RDW: 11.9 % (ref 11.5–15.5)
WBC: 6.7 10*3/uL (ref 4.0–10.5)
nRBC: 0 % (ref 0.0–0.2)

## 2018-08-20 LAB — ETHANOL: Alcohol, Ethyl (B): 152 mg/dL — ABNORMAL HIGH (ref <10)

## 2018-08-20 LAB — ACETAMINOPHEN LEVEL: Acetaminophen (Tylenol), Serum: <10 ug/mL — ABNORMAL LOW (ref 10–30)

## 2018-08-20 NOTE — ED Notes (Signed)
[employer] officer Laurance Flatten notified this RN that she was unable to locate patient's boyfriend, this RN took custody of patient's jewelry, pt notified [employer] officer unable to locate boyfriend and security would lock up jewelry in safe. This RN reviewed contents of bag with [employer] officer, and placed in bag. ODS to have patient sign and place in safe.

## 2018-08-20 NOTE — ED Provider Notes (Signed)
[facility] Emergency Department Provider Note       Time seen: ----------------------------------------- 12:49 PM on 08/20/2018 -----------------------------------------   I have reviewed the triage vital signs and the nursing notes.  HISTORY   Chief Complaint Psychiatric Evaluation    HPI Kaitlyn Turner is a 33 y.o. female with a history of  pulmonary embolism who presents to the ED for involuntary commitment.  Patient arrives to the ER by [facility] under involuntary commitment.  Patient texted her boyfriend stating that if something happened to her that he should take care of the kids.  She initially would not open the door to allow police to come in.  She was noted to have a superficial laceration to the right wrist.  Past Medical History:  Diagnosis Date  . Hypotension   . Malignant hyperthermia   . Pulmonary embolism (HCC)     There are no active problems to display for this patient.   Past Surgical History:  Procedure Laterality Date  . DENTAL SURGERY      Allergies Oxycodone; Keflex [cephalexin]; Other; Toradol [ketorolac tromethamine]; and Tramadol  Social History Social History   Tobacco Use  . Smoking status: Never Smoker  . Smokeless tobacco: Never Used  Substance Use Topics  . Alcohol use: Yes    Comment: occasionally  . Drug use: No   Review of Systems Constitutional: Negative for fever. Cardiovascular: Negative for chest pain. Respiratory: Negative for shortness of breath. Gastrointestinal: Negative for abdominal pain, vomiting and diarrhea. Musculoskeletal: Negative for back pain. Skin: Positive for wrist laceration Neurological: Negative for headaches, focal weakness or numbness. Psychiatric: Positive for All systems negative/normal/unremarkable except as stated in the HPI  ____________________________________________   PHYSICAL EXAM:  VITAL SIGNS: ED Triage Vitals  Enc Vitals Group      BP 08/20/18 1234 131/90     Pulse Rate 08/20/18 1234 73     Resp 08/20/18 1234 18     Temp 08/20/18 1234 98.3 F (36.8 C)     Temp Source 08/20/18 1234 Oral     SpO2 08/20/18 1234 98 %     Weight 08/20/18 1235 180 lb (81.6 kg)     Height 08/20/18 1235 5\' 6"  (1.676 m)     Head Circumference --      Peak Flow --      Pain Score 08/20/18 1235 0     Pain Loc --      Pain Edu? --      Excl. in [place]? --    Constitutional: Alert and oriented. Well appearing and in no distress. Eyes: Conjunctivae are normal. Normal extraocular movements. Cardiovascular: Normal rate, regular rhythm. No murmurs, rubs, or gallops. Respiratory: Normal respiratory effort without tachypnea nor retractions. Breath sounds are clear and equal bilaterally. No wheezes/rales/rhonchi. Gastrointestinal: Soft and nontender. Normal bowel sounds Musculoskeletal: Nontender with normal range of motion in extremities. No lower extremity tenderness nor edema. Neurologic:  Normal speech and language. No gross focal neurologic deficits are appreciated.  Skin: Superficial lacerations noted to the volar aspect of the right wrist Psychiatric: Mood and affect are normal. Speech and behavior are normal.  ____________________________________________  ED COURSE:  As part of my medical decision making, I reviewed the following data within the [place] History obtained from family if available, nursing notes, old chart and ekg, as well as notes from prior ED visits. Patient presented for possible suicidal ideation, we will assess with labs and imaging as indicated at this  time.   Procedures Kaitlyn Turner was evaluated in Emergency Department on 08/20/2018 for the symptoms described in the history of present illness. She was evaluated in the context of the global COVID-19 pandemic, which necessitated consideration that the patient might be at risk for infection with the SARS-CoV-2 virus that causes COVID-19. Institutional  protocols and algorithms that pertain to the evaluation of patients at risk for COVID-19 are in a state of rapid change based on information released by regulatory bodies including the CDC and federal and state organizations. These policies and algorithms were followed during the patient's care in the ED.  ____________________________________________   LABS (pertinent positives/negatives)  Labs Reviewed  COMPREHENSIVE METABOLIC PANEL - Abnormal; Notable for the following components:      Result Value   CO2 21 (*)    Glucose, Bld 105 (*)    Calcium 8.5 (*)    All other components within normal limits  ETHANOL - Abnormal; Notable for the following components:   Alcohol, Ethyl (B) 152 (*)    All other components within normal limits  ACETAMINOPHEN LEVEL - Abnormal; Notable for the following components:   Acetaminophen (Tylenol), Serum <10 (*)    All other components within normal limits  SALICYLATE LEVEL  CBC  URINE DRUG SCREEN, QUALITATIVE ([facility] ONLY)  POC URINE PREG, ED  ____________________________________________   DIFFERENTIAL DIAGNOSIS   Depression, suicidal ideation, cutting, intoxication  FINAL ASSESSMENT AND PLAN  Suicidal ideation, alcohol intoxication   Plan: The patient had presented for suicidal ideation. Patient's labs did reveal some alcohol intoxication but no other acute process.  She is medically stable for psychiatric evaluation and disposition.   Laurence Aly, MD    Note: This note was generated in part or whole with voice recognition software. Voice recognition is usually quite accurate but there are transcription errors that can and very often do occur. I apologize for any typographical errors that were not detected and corrected.     Earleen Newport, MD 08/20/18 1322

## 2018-08-20 NOTE — ED Triage Notes (Signed)
Pt presents to ED via [employer] under IVC with IVC papers. Per [employer] officer pt texted her boyfriend if something happened to her to "take care of the kids". [employer] officers states upon their arrival patient would not open the door, when contact with patient was finally made, pt was noted to have a superficial laceration to R wrist that was evaluated by EMS on scene prior to arrival to hospital.

## 2018-08-20 NOTE — ED Notes (Signed)
Pt given ginger ale. No other needs voiced at this time. Will continue to monitor Q15 min rounds.

## 2018-08-20 NOTE — ED Notes (Addendum)
Charge nurse Brandy placed [address] in [place] (patients valuables)

## 2018-08-20 NOTE — ED Notes (Signed)
First Nurse Note: Pt to ED via [employer] under IVC for SI. Pt is in NAD.

## 2018-08-20 NOTE — ED Notes (Signed)
Hourly rounding reveals patient sleeping in room. No complaints, stable, in no acute distress. Q15 minute rounds and monitoring via [employer] to continue. 
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
## 2018-08-20 NOTE — ED Notes (Signed)
Patient assigned to appropriate care area   Introduced self to pt  Patient oriented to unit/care area: Informed that, for their safety, care areas are designed for safety and visiting and phone hours explained to patient. Patient verbalizes understanding, and verbal contract for safety obtained  Environment secured    Patient texted her boyfriend if something happens to me "take care of the kids". Patient said that she recently has been feeling overwhelmed, and that she has experieneced a lot of deaths with family and friends. Patient said she has a therapist but has not been able to see her, and that she was overwhelmed she said she did not hear [employer] officers arrive and knock, but was compliant when they entered the home. Pt was noted to have a superficial laceration to R wrist and has been covered with a small bandage by triage nurse.

## 2018-08-20 NOTE — ED Notes (Addendum)
Pt belongings sent home with boyfriend Beryle Lathe, given to boyfriend by [employer] Officer Moore:  1 yellow ring, 1 yellow ring with clear stones, 1 yellow ring with clear stones in shape of crown, 1 yellow ring, 1 yellow bracelet, 1 yellow bracelet, 1 yellow necklace in thin chain, 1 yellow necklace chain links, 1 yellow necklace with yellow charm that says "Eli".

## 2018-08-20 NOTE — ED Notes (Signed)
Pt belongings kept at hospital: 1 pair slip on shoes, 1 pair socks, 1 pair plaid pajama pants, 1 white long sleeve shirt with hood, 1 pair red underwear.
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
## 2018-08-20 NOTE — ED Notes (Signed)
Snack and beverage given. 

## 2018-08-20 NOTE — ED Notes (Signed)
Pt. Transferred to [facility] from ED to room 7 after screening for contraband. Report to include Situation, Background, Assessment and Recommendations from [facility]. Pt. Oriented to unit including Q15 minute rounds as well as the security cameras for their protection. Patient is alert and oriented, warm and dry in no acute distress. Patient denies SI, HI, and AVH. Pt. Encouraged to let me know if needs arise.

## 2018-08-20 NOTE — ED Notes (Signed)
Patient talking to psychiatrist

## 2018-08-20 NOTE — ED Notes (Signed)
Hourly rounding reveals patient in room. No complaints, stable, in no acute distress. Q15 minute rounds and monitoring via Security Cameras to continue. 

## 2018-08-21 ENCOUNTER — Other Ambulatory Visit: Payer: Self-pay | Admitting: Family

## 2018-08-21 ENCOUNTER — Inpatient Hospital Stay (HOSPITAL_COMMUNITY)
Admission: AD | Admit: 2018-08-21 | Discharge: 2018-08-23 | DRG: 881 | Disposition: A | Discharge status: Home / Self Care | Payer: Medicaid Other | Attending: Psychiatry | Admitting: Psychiatry

## 2018-08-21 ENCOUNTER — Other Ambulatory Visit: Payer: Self-pay

## 2018-08-21 ENCOUNTER — Encounter (HOSPITAL_COMMUNITY): Payer: Self-pay | Admitting: *Deleted

## 2018-08-21 DIAGNOSIS — F329 Major depressive disorder, single episode, unspecified: Secondary | ICD-10-CM | POA: Diagnosis present

## 2018-08-21 DIAGNOSIS — G4700 Insomnia, unspecified: Secondary | ICD-10-CM | POA: Diagnosis present

## 2018-08-21 DIAGNOSIS — Z86711 Personal history of pulmonary embolism: Secondary | ICD-10-CM

## 2018-08-21 DIAGNOSIS — F419 Anxiety disorder, unspecified: Secondary | ICD-10-CM | POA: Diagnosis present

## 2018-08-21 DIAGNOSIS — F339 Major depressive disorder, recurrent, unspecified: Secondary | ICD-10-CM | POA: Diagnosis not present

## 2018-08-21 DIAGNOSIS — N390 Urinary tract infection, site not specified: Secondary | ICD-10-CM | POA: Diagnosis present

## 2018-08-21 DIAGNOSIS — F1010 Alcohol abuse, uncomplicated: Secondary | ICD-10-CM | POA: Diagnosis present

## 2018-08-21 DIAGNOSIS — Z888 Allergy status to other drugs, medicaments and biological substances status: Secondary | ICD-10-CM | POA: Diagnosis not present

## 2018-08-21 DIAGNOSIS — Z634 Disappearance and death of family member: Secondary | ICD-10-CM

## 2018-08-21 DIAGNOSIS — Z91018 Allergy to other foods: Secondary | ICD-10-CM

## 2018-08-21 DIAGNOSIS — Z885 Allergy status to narcotic agent status: Secondary | ICD-10-CM | POA: Diagnosis not present

## 2018-08-21 DIAGNOSIS — Y906 Blood alcohol level of 120-199 mg/100 ml: Secondary | ICD-10-CM | POA: Diagnosis present

## 2018-08-21 DIAGNOSIS — R45851 Suicidal ideations: Secondary | ICD-10-CM | POA: Diagnosis present

## 2018-08-21 LAB — URINALYSIS, ROUTINE W REFLEX MICROSCOPIC
Bacteria, UA: NONE SEEN
Bilirubin Urine: NEGATIVE
Glucose, UA: NEGATIVE mg/dL
Ketones, ur: NEGATIVE mg/dL
Leukocytes,Ua: NEGATIVE
Nitrite: NEGATIVE
Protein, ur: NEGATIVE mg/dL
Specific Gravity, Urine: 1.006 (ref 1.005–1.030)
pH: 6 (ref 5.0–8.0)

## 2018-08-21 MED ORDER — ALUM & MAG HYDROXIDE-SIMETH 200-200-20 MG/5ML PO SUSP: 30.0000 mL | ORAL | Status: DC | PRN

## 2018-08-21 MED ORDER — SULFAMETHOXAZOLE-TRIMETHOPRIM 800-160 MG PO TABS
1.0000 | ORAL_TABLET | Freq: Two times a day (BID) | ORAL | Status: DC
  Administered 2018-08-21 – 2018-08-23 (×4): 1 via ORAL
  Filled 2018-08-21 (×7): qty 1

## 2018-08-21 MED ORDER — TRAZODONE HCL 50 MG PO TABS
50.0000 mg | ORAL_TABLET | Freq: Every evening | ORAL | Status: DC | PRN
  Filled 2018-08-21: qty 1

## 2018-08-21 MED ORDER — ACETAMINOPHEN 325 MG PO TABS: 650.0000 mg | ORAL_TABLET | Freq: Four times a day (QID) | ORAL | Status: DC | PRN

## 2018-08-21 MED ORDER — HYDROXYZINE HCL 25 MG PO TABS
25.0000 mg | ORAL_TABLET | Freq: Three times a day (TID) | ORAL | Status: DC | PRN
  Administered 2018-08-21: 25 mg via ORAL
  Filled 2018-08-21: qty 1

## 2018-08-21 MED ORDER — MAGNESIUM HYDROXIDE 400 MG/5ML PO SUSP: 30.0000 mL | Freq: Every day | ORAL | Status: DC | PRN

## 2018-08-21 NOTE — ED Notes (Signed)
Hourly rounding reveals patient sleeping in room. No complaints, stable, in no acute distress. Q15 minute rounds and monitoring via [employer] to continue. 

## 2018-08-21 NOTE — ED Provider Notes (Signed)
-----------------------------------------   1:12 PM on 08/21/2018 -----------------------------------------   BP 121/83 (BP Location: Left Arm)   Pulse 66   Temp 98.5 F (36.9 C) (Oral)   Resp 18   Ht 1.676 m (5\' 6" )   Wt 81.6 kg   LMP 08/06/2018 (Within Days)   SpO2 99%   BMI 29.05 kg/m   No acute events overnight. Vitals reviewed. Patient remains medically cleared.  Disposition is pending per Psychiatry/Behavioral Medicine team recommendations.    Lavonia Drafts, MD 08/21/18 1312

## 2018-08-21 NOTE — ED Notes (Signed)
Attempted to call report to [facility].  Was told that charge nurse would call me back.

## 2018-08-21 NOTE — ED Notes (Signed)
EMTALA reviewed by charge RN 

## 2018-08-21 NOTE — Progress Notes (Signed)
Pt admitted to [facility] after cutting wrist. Pt's first admission to a behavioral health facility. Pt denied that this was a suicide attempt.  Pt stated she has recently loss a cousin and best friend (did not share cause of deaths).  Pt stated her husband was away and she began to drink because of being depressed related to these deaths as well as having to stay at home d/t covid-19 shelter in place.  Pt stated she began to drink the following morning and at this time cut her wrist but was unsure why.  Pt stated she does not normally drink heavily but is rather a social drinker having maybe a glass of wine weekly.   Pt denied SI, HI and AVH at the time of admission.  Denied AVH.  Fifteen minute checks initiated for patient safety.  Pt safe on unit.

## 2018-08-21 NOTE — Progress Notes (Signed)
Urine cup given for UPT. Patient states she has nexplanon but agrees to provide sample when she next uses restroom. Verbalizes understanding to return to staff once sample obtained.

## 2018-08-21 NOTE — Tx Team (Signed)
Initial Treatment Plan 08/21/2018 7:15 PM Kaitlyn Turner YPP:[id number]    PATIENT STRESSORS: Loss of cousin and best friend   PATIENT STRENGTHS: Ability for insight Average or above average intelligence Capable of independent living Occupational psychologist fund of knowledge Motivation for treatment/growth Physical Health Supportive family/friends Work skills   PATIENT IDENTIFIED PROBLEMS: Depression "I have felt so sad since I lost my cousin and best friend"  suicidal ideation                   DISCHARGE CRITERIA:  Ability to meet basic life and health needs Improved stabilization in mood, thinking, and/or behavior Motivation to continue treatment in a less acute level of care Need for constant or close observation no longer present Reduction of life-threatening or endangering symptoms to within safe limits Verbal commitment to aftercare and medication compliance  PRELIMINARY DISCHARGE PLAN: Outpatient therapy Return to previous living arrangement Return to previous work or school arrangements  PATIENT/FAMILY INVOLVEMENT: This treatment plan has been presented to and reviewed with the patient, Kaitlyn Turner, and/or family member.  The patient and family have been given the opportunity to ask questions and make suggestions.  Judie Petit, RN 08/21/2018, 7:15 PM

## 2018-08-21 NOTE — BH Assessment (Signed)
Patient has been accepted to [facility].  Patient assigned to room 402-2. Accepting physician is Dr. Parke Poisson.  Call report to [phone].  Representative was [employer].   ER Staff is aware of it:  Lisa/Glenda, ER Secretary  Dr. Corky Downs, ER MD  Berton Lan, Patient's Nurse    *Requesting that pt arrive after 3:00pm

## 2018-08-21 NOTE — ED Notes (Signed)
Breakfast tray given.  Hand hygiene encouraged.

## 2018-08-21 NOTE — ED Notes (Signed)
[facility]  DEPT  CALLED FOR  TRANSPORT  TO  [facility] 

## 2018-08-22 DIAGNOSIS — F339 Major depressive disorder, recurrent, unspecified: Secondary | ICD-10-CM

## 2018-08-22 DIAGNOSIS — R45851 Suicidal ideations: Secondary | ICD-10-CM

## 2018-08-22 LAB — PREGNANCY, URINE: Preg Test, Ur: NEGATIVE

## 2018-08-22 MED ORDER — HYDROXYZINE HCL 25 MG PO TABS: 25.0000 mg | ORAL_TABLET | Freq: Four times a day (QID) | ORAL | Status: DC | PRN

## 2018-08-22 MED ORDER — HYDROXYZINE HCL 25 MG PO TABS: 25.0000 mg | ORAL_TABLET | Freq: Every evening | ORAL | Status: DC | PRN

## 2018-08-22 MED ORDER — FOLIC ACID 1 MG PO TABS
1.0000 mg | ORAL_TABLET | Freq: Every day | ORAL | Status: DC
  Administered 2018-08-22 – 2018-08-23 (×2): 1 mg via ORAL
  Filled 2018-08-22 (×4): qty 1

## 2018-08-22 MED ORDER — VITAMIN B-1 100 MG PO TABS
100.0000 mg | ORAL_TABLET | Freq: Every day | ORAL | Status: DC
  Administered 2018-08-22 – 2018-08-23 (×2): 100 mg via ORAL
  Filled 2018-08-22 (×4): qty 1

## 2018-08-22 MED ORDER — TRAZODONE HCL 50 MG PO TABS
50.0000 mg | ORAL_TABLET | Freq: Every evening | ORAL | Status: DC | PRN
  Administered 2018-08-22: 22:00:00 50 mg via ORAL
  Filled 2018-08-22 (×7): qty 1

## 2018-08-22 NOTE — BHH Suicide Risk Assessment (Signed)
[place] INPATIENT:  Family/Significant Other Suicide Prevention Education  Suicide Prevention Education:  Education Completed; with husband, Beryle Lathe [phone]) has been identified by the patient as the family member/significant other with whom the patient will be residing, and identified as the person(s) who will aid the patient in the event of a mental health crisis (suicidal ideations/suicide attempt).  With written consent from the patient, the family member/significant other has been provided the following suicide prevention education, prior to the and/or following the discharge of the patient.  The suicide prevention education provided includes the following:  Suicide risk factors  Suicide prevention and interventions  National Suicide Hotline telephone number  [facility] assessment telephone number  [facility] Emergency Assistance [place] and/or Residential Mobile Crisis Unit telephone number  Request made of family/significant other to:  Remove weapons (e.g., guns, rifles, knives), all items previously/currently identified as safety concern.    Remove drugs/medications (over-the-counter, prescriptions, illicit drugs), all items previously/currently identified as a safety concern.  The family member/significant other verbalizes understanding of the suicide prevention education information provided.  The family member/significant other agrees to remove the items of safety concern listed above.  Patient's husband reports the patient has never demonstrated that behavior before. He states that he feels the patient was "just overwhelmed with everything" and that "she is not that type of woman". He states that he is ready for the patient to return home and that he does not have any questions or concerns regarding the patient's safety.   Kaitlyn Turner 08/22/2018, 11:09 AM

## 2018-08-22 NOTE — Progress Notes (Signed)
Adult Psychoeducational Group Note  Date:  08/22/2018 Time:  11:08 AM  Group Topic/Focus:  Goals Group:   The focus of this group is to help patients establish daily goals to achieve during treatment and discuss how the patient can incorporate goal setting into their daily lives to aide in recovery. Orientation:   The focus of this group is to educate the patient on the purpose and policies of crisis stabilization and provide a format to answer questions about their admission.  The group details unit policies and expectations of patients while admitted.  Participation Level:  Active  Participation Quality:  Appropriate  Affect:  Appropriate  Cognitive:  Alert  Insight: Appropriate  Engagement in Group:  Engaged  Modes of Intervention:  Discussion and Education  Additional Comments:    Pt participated in goals group. Pt's goal for today is to check on her kids to see how they are doing. Pt complains of issues with sleep.   Lita Mains 08/22/2018, 11:08 AM

## 2018-08-22 NOTE — Progress Notes (Signed)
Adult Psychoeducational Group Note  Date:  08/22/2018 Time:  8:39 PM  Group Topic/Focus:  Wrap-Up Group:   The focus of this group is to help patients review their daily goal of treatment and discuss progress on daily workbooks.  Participation Level:  Active  Participation Quality:  Appropriate  Affect:  Appropriate  Cognitive:  Appropriate  Insight: Appropriate  Engagement in Group:  Engaged  Modes of Intervention:  Discussion  Additional Comments:  Pt stated her goals were to make sure her kids are taken care of, speak to her husband and stay out of her room.  Pt stated that she did meet all of her goals.  Pt rated the day at a 8/10.  Kaitlyn Turner 08/22/2018, 8:39 PM

## 2018-08-22 NOTE — Progress Notes (Signed)
Patient refused AM lab. Remained in bed. Quite groggy. Rescheduled lab for Tues 08/23/18.

## 2018-08-22 NOTE — BHH Group Notes (Signed)
[place] Group Notes:  Nursing Psychoeducation  Date:  08/22/2018  Time:  2:00 PM  Type of Therapy:  Psychoeducational Skills   Group Topic: Identifying Anxiety Triggers, Debunking Cognitive Distortions, and Utilizing Coping Skills  Participation Level:  Minimal  Participation Quality:  Poor  Affect:  Flat  Cognitive:  Alert and Oriented  Insight:  Improving  Engagement in Group:  Developing/Improving  Modes of Intervention:  Discussion, Education and Socialization  Summary of Progress/Problems: Patient did not contribute to the overall discussion. Patient was present throughout group but chose not to share.  [place] 08/22/2018, 2:50 PM

## 2018-08-22 NOTE — Progress Notes (Signed)
D:  Patient's self inventory sheet, patient has poor sleep, sleep medication not helpful.  Good appetite, normal energy level, good concentration.  Denied depression, hopeless and anxiety.  Denied withdrawals.  Denied SI.  Denied physical problems. Denied physical pain.  Goal is make sure children at home are OK.  Plans to call home and friends.  Does have discharge plans. A:  Medications administered per MD orders.  Emotional support and encouragement given patient. R:  Denied SI and HI.  Denied A/V hallucinations.  Safety maintained with 15 minute checks.

## 2018-08-22 NOTE — H&P (Signed)
Psychiatric Admission Assessment Adult  Patient Identification: Kaitlyn Turner MRN:  [id number] Date of Evaluation:  08/22/2018 Chief Complaint:  MDD Principal Diagnosis: <principal problem not specified> Diagnosis:  Active Problems:   MDD (major depressive disorder), recurrent episode ([place]set)  History of Present Illness: Patient is seen and examined.  Patient is a 33 year old female who presented to the [facility] emergency department after being placed under involuntary commitment by local police.  The patient admitted to having suicidal ideation prior to cutting herself.  She cut her right wrist but was very superficial and did not require any stitches.  She stated she had texted her boyfriend that if anything happened to her to take care of her children.  The boyfriend became scared and called 911.  She stated she had been stressed recently.  She stated that she had had 2 close friends/relatives die recently.  She stated after that her alcohol intake had gone up.  She had been working as a Chief Operating Officer on weekends, and going to school.  She was currently not able to work because the closing her breast around secondary to coronavirus.  Her blood alcohol in the emergency department was greater than 150.  She stated that she is a social drinker and does not usually engage in excessive intake.  She stated she had a DUI but that was 8 years ago.  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  She lives with her boyfriend.  She denied any depressive symptoms.  She attributes everything that occurred to her aunt increased alcohol intake.  She denied any helplessness, hopelessness or worthlessness.  She stated that she is interested in counseling after the hospitalization.  She was admitted to the hospital for evaluation and stabilization.  Associated Signs/Symptoms: Depression Symptoms:  depressed mood, insomnia, psychomotor agitation, fatigue, feelings of  worthlessness/guilt, hopelessness, suicidal thoughts without plan, anxiety, loss of energy/fatigue, (Hypo) Manic Symptoms:  Impulsivity, Anxiety Symptoms:  Excessive Worry, Psychotic Symptoms:  denied PTSD Symptoms: Negative Total Time spent with patient: 30 minutes  Past Psychiatric History: Patient had counseling with a psychologist when she was a child after the death of a relative.  No medications were prescribed.  No previous psychiatric hospitalizations.  Is the patient at risk to self? No.  Has the patient been a risk to self in the past 6 months? No.  Has the patient been a risk to self within the distant past? No.  Is the patient a risk to others? No.  Has the patient been a risk to others in the past 6 months? No.  Has the patient been a risk to others within the distant past? No.   Prior Inpatient Therapy:   Prior Outpatient Therapy:    Alcohol Screening: 1. How often do you have a drink containing alcohol?: Monthly or less 2. How many drinks containing alcohol do you have on a typical day when you are drinking?: 1 or 2 3. How often do you have six or more drinks on one occasion?: Never AUDIT-C Score: 1 4. How often during the last year have you found that you were not able to stop drinking once you had started?: Less than monthly 5. How often during the last year have you failed to do what was normally expected from you becasue of drinking?: Never 6. How often during the last year have you needed a first drink in the morning to get yourself going after a heavy drinking session?: Never 7. How often during the last  year have you had a feeling of guilt of remorse after drinking?: Never 8. How often during the last year have you been unable to remember what happened the night before because you had been drinking?: Never 9. Have you or someone else been injured as a result of your drinking?: No 10. Has a relative or friend or a doctor or another health worker been concerned  about your drinking or suggested you cut down?: No Alcohol Use Disorder Identification Test Final Score (AUDIT): 2 Substance Abuse History in the last 12 months:  Yes.   Consequences of Substance Abuse: Suicidal ideation with wrist cutting. Previous Psychotropic Medications: No  Psychological Evaluations: Yes  Past Medical History:  Past Medical History:  Diagnosis Date  . Hypotension   . Malignant hyperthermia   . Pulmonary embolism [facility])     Past Surgical History:  Procedure Laterality Date  . DENTAL SURGERY     Family History: History reviewed. No pertinent family history. Family Psychiatric  History: Denied Tobacco Screening: Have you used any form of tobacco in the last 30 days? (Cigarettes, Smokeless Tobacco, Cigars, and/or Pipes): No Social History:  Social History   Substance and Sexual Activity  Alcohol Use Yes   Comment: occasionally     Social History   Substance and Sexual Activity  Drug Use No    Additional Social History: Marital status: Married Number of Years Married: 7 What types of issues is patient dealing with in the relationship?: Patient denies any issues  Are you sexually active?: Yes What is your sexual orientation?: Heterosexual  Has your sexual activity been affected by drugs, alcohol, medication, or emotional stress?: No  Does patient have children?: Yes How many children?: 3 How is patient's relationship with their children?: Patient reports she has a good relationship with her three children; ages 63yo, 33yo and 59yo.                          Allergies:   Allergies  Allergen Reactions  . Oxycodone Anaphylaxis  . Keflex [Cephalexin] Hives  . Other     General anesthesia - reaction was hypothermia  . 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 Toradol [Ketorolac Tromethamine] Hives  . Tramadol Hives   Lab Results:  Results for orders placed or performed during the hospital encounter of 08/21/18  (from the past 48 hour(s))  Pregnancy, urine     Status: None   Collection Time: 08/21/18 10:03 PM  Result Value Ref Range   Preg Test, Ur NEGATIVE NEGATIVE    Comment:        THE SENSITIVITY OF THIS METHODOLOGY IS >20 mIU/mL. Performed at [facility], [place] [address] [place], [place] [address]     Blood Alcohol level:  Lab Results  Component Value Date   ETH 152 (H) 46/96/2952    Metabolic Disorder Labs:  No results found for: HGBA1C, MPG No results found for: PROLACTIN No results found for: CHOL, TRIG, HDL, CHOLHDL, VLDL, LDLCALC  Current Medications: Current Facility-Administered Medications  Medication Dose Route Frequency Provider Last Rate Last Dose  . acetaminophen (TYLENOL) tablet 650 mg  650 mg Oral Q6H PRN Derrill Center, NP      . alum & mag hydroxide-simeth (MAALOX/MYLANTA) 200-200-20 MG/5ML suspension 30 mL  30 mL Oral Q4H PRN Derrill Center, NP      . folic acid (FOLVITE) tablet 1 mg  1  mg Oral Daily Sharma Covert, MD   1 mg at 08/22/18 1156  . hydrOXYzine (ATARAX/VISTARIL) tablet 25 mg  25 mg Oral Q6H PRN Sharma Covert, MD      . hydrOXYzine (ATARAX/VISTARIL) tablet 25 mg  25 mg Oral QHS PRN Sharma Covert, MD      . magnesium hydroxide (MILK OF MAGNESIA) suspension 30 mL  30 mL Oral Daily PRN Derrill Center, NP      . sulfamethoxazole-trimethoprim (BACTRIM DS,SEPTRA DS) 800-160 MG per tablet 1 tablet  1 tablet Oral Q12H Derrill Center, NP   1 tablet at 08/22/18 0855  . thiamine (VITAMIN B-1) tablet 100 mg  100 mg Oral Daily Sharma Covert, MD   100 mg at 08/22/18 1158   PTA Medications: Medications Prior to Admission  Medication Sig Dispense Refill Last Dose  . fexofenadine (ALLEGRA) 180 MG tablet Take 180 mg by mouth daily as needed for allergies or rhinitis.     Marland Kitchen albuterol (PROVENTIL HFA;VENTOLIN HFA) 108 (90 Base) MCG/ACT inhaler Inhale 2 puffs into the lungs every 6 (six) hours as needed. (Patient not taking:  Reported on 08/22/2018) 1 Inhaler 0 Completed Course at Unknown time  . phenazopyridine (PYRIDIUM) 200 MG tablet Take 1 tablet (200 mg total) by mouth 3 (three) times daily as needed for pain. (Patient not taking: Reported on 08/22/2018) 6 tablet 0 Completed Course at Unknown time  . sulfamethoxazole-trimethoprim (BACTRIM DS,SEPTRA DS) 800-160 MG tablet Take 1 tablet by mouth 2 (two) times daily. (Patient not taking: Reported on 08/22/2018) 10 tablet 0 Completed Course at Unknown time    Musculoskeletal: Strength & Muscle Tone: within normal limits Gait & Station: normal Patient leans: N/A  Psychiatric Specialty Exam: Physical Exam  Nursing note and vitals reviewed. Constitutional: She is oriented to person, place, and time. She appears well-developed and well-nourished.  HENT:  Head: Normocephalic and atraumatic.  Respiratory: Effort normal.  Neurological: She is alert and oriented to person, place, and time.    ROS  Blood pressure 120/79, pulse 78, temperature 97.9 F (36.6 C), temperature source Oral, resp. rate 18, height 5\' 4"  (1.626 m), weight 80.7 kg, last menstrual period 08/06/2018, SpO2 100 %.Body mass index is 30.55 kg/m.  General Appearance: Casual  Eye Contact:  Fair  Speech:  Normal Rate  Volume:  Normal  Mood:  Anxious  Affect:  Congruent  Thought Process:  Coherent and Descriptions of Associations: Intact  Orientation:  Full (Time, Place, and Person)  Thought Content:  Logical  Suicidal Thoughts:  No  Homicidal Thoughts:  No  Memory:  Immediate;   Fair Recent;   Fair Remote;   Fair  Judgement:  Intact  Insight:  Fair  Psychomotor Activity:  Normal  Concentration:  Concentration: Fair and Attention Span: Fair  Recall:  [employer] of Knowledge:  Fair  Language:  Fair  Akathisia:  Negative  Handed:  Right  AIMS (if indicated):     Assets:  Communication Skills Desire for Improvement Financial Resources/Insurance Housing Intimacy Physical Health Resilience   ADL's:  Intact  Cognition:  WNL  Sleep:  Number of Hours: 6.25    Treatment Plan Summary: Daily contact with patient to assess and evaluate symptoms and progress in treatment, Medication management and Plan : Patient is seen and examined.  Patient is a 33 year old female with the above-stated past psychiatric history who was admitted after some self cutting behaviors.  She was transferred from [facility] to our  facility for evaluation and stabilization.  She currently denies any depressive symptoms.  She stated she had appreciated some counseling after the hospitalization.  She denied excessive drinking except over the last couple weeks.  She denied any withdrawal symptoms in the past.  She has a DUI but that was from 8 years ago.  Her blood alcohol on admission was greater than 150.  She is currently being treated for urinary tract infection with the Bactrim.  Her liver function enzymes are normal.  She will be admitted to the hospital.  Should be integrated into the milieu.  She will be encouraged to attend groups.  She will be encouraged to work on her coping skills.  We will monitor for alcohol withdrawal symptoms.  She will receive folic acid and thiamine.  We will contact collateral information to make sure about her story.  She was sent here on involuntary commitment, but does not fulfill criteria, but we will hold off on dropping the commitment until we get collateral information.  Observation Level/Precautions:  15 minute checks  Laboratory:  CBC Chemistry Profile  Psychotherapy:    Medications:    Consultations:    Discharge Concerns:    Estimated LOS:  Other:     Physician Treatment Plan for Primary Diagnosis: <principal problem not specified> Long Term Goal(s): Improvement in symptoms so as ready for discharge  Short Term Goals: Ability to identify changes in lifestyle to reduce recurrence of condition will improve, Ability to verbalize feelings will improve,  Ability to disclose and discuss suicidal ideas, Ability to demonstrate self-control will improve, Ability to identify and develop effective coping behaviors will improve, Ability to maintain clinical measurements within normal limits will improve and Ability to identify triggers associated with substance abuse/mental health issues will improve  Physician Treatment Plan for Secondary Diagnosis: Active Problems:   MDD (major depressive disorder), recurrent episode ([place])  Long Term Goal(s): Improvement in symptoms so as ready for discharge  Short Term Goals: Ability to identify changes in lifestyle to reduce recurrence of condition will improve, Ability to verbalize feelings will improve, Ability to disclose and discuss suicidal ideas, Ability to demonstrate self-control will improve, Ability to identify and develop effective coping behaviors will improve, Ability to maintain clinical measurements within normal limits will improve and Ability to identify triggers associated with substance abuse/mental health issues will improve  I certify that inpatient services furnished can reasonably be expected to improve the patient's condition.    Sharma Covert, MD 4/7/20201:09 PM
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
## 2018-08-22 NOTE — Progress Notes (Addendum)
D: Patient observed in the dayroom this evening. Interaction limited with peers. Cautious on approach. Patient states, "I did a dumb thing, and now I regret it. I was just drunk. I was impulsive. I love my kids, and I'm embarrassed the older ones know what happened. It's just been a lot with my cousin and friend dying a week apart." Patient's affect depressed and anxious with congruent mood. Denies pain, physical complaints.   A: Medicated per orders, prn vistaril given for anxiety as patient does not wish to take trazadone which was offered. Medication education provided. Level III obs in place for safety. Emotional support offered. Patient encouraged to complete Suicide Safety Plan before discharge. Encouraged to attend and participate in unit programming.    R: Patient verbalizes understanding of POC. On reassess, patient was asleep. Patient denies SI/HI/AVH and remains safe on level III obs. Will continue to monitor throughout the night.

## 2018-08-22 NOTE — Progress Notes (Signed)
Adult Psychoeducational Group Note  Date:  08/22/2018 Time:  1:29 AM  Group Topic/Focus:  Wrap-Up Group:   The focus of this group is to help patients review their daily goal of treatment and discuss progress on daily workbooks.  Participation Level:  Active  Participation Quality:  Appropriate  Affect:  Appropriate  Cognitive:  Appropriate  Insight: Appropriate  Engagement in Group:  Engaged  Modes of Intervention:  Discussion  Additional Comments:  Pt was admitted this afternoon and did not have any goals set for the day.  Pt rated the day at 5/10 because she was able to take a shower.    Rocklyn Mayberry 08/22/2018, 1:29 AM
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
## 2018-08-22 NOTE — Plan of Care (Signed)
Nurse discussed anxiety, depression and coping skills with patient.  

## 2018-08-22 NOTE — Progress Notes (Signed)
Patient did not sleep well last night, woke up several times.  Will discuss medications with MD today.

## 2018-08-22 NOTE — Plan of Care (Signed)
D: Patient is alert, oriented, pleasant, and cooperative. Denies SI, HI, AVH, and verbally contracts for safety. Patient denies physical symptoms/pain. Interaction is minimal.    A: Medications administered per MD order. Support provided. Patient educated on safety on the unit and medications. Routine safety checks every 15 minutes. Patient stated understanding to tell nurse about any new physical symptoms. Patient understands to tell staff of any needs.     R: No adverse drug reactions noted. Patient verbally contracts for safety. Patient remains safe at this time and will continue to monitor.   Problem: Education: Goal: Mental status will improve Outcome: Progressing   Problem: Safety: Goal: Periods of time without injury will increase Outcome: Progressing  Patient denies SI, HI, AVH, and contracts for safety. Patient remains safe and will continue to monitor.

## 2018-08-22 NOTE — BHH Group Notes (Signed)
[facility] Group Notes:  (Nursing/MHT/Case Management/Adjunct)  Date:  08/22/2018  Time:  3:58 PM  Type of Therapy:  Psychoeducational Skills  Participation Level:  Active  Participation Quality:  Appropriate  Affect:  Appropriate  Cognitive:  Alert  Insight:  Appropriate  Engagement in Group:  Engaged  Modes of Intervention:  Discussion and Education  Summary of Progress/Problems:  Pt participated in group. During this group pt's watched a movie related to mental health.   Lita Mains 08/22/2018, 3:58 PM

## 2018-08-23 LAB — TSH: TSH: 3.208 u[IU]/mL (ref 0.350–4.500)

## 2018-08-23 MED ORDER — TRAZODONE HCL 50 MG PO TABS: 50.0000 mg | ORAL_TABLET | Freq: Every evening | ORAL | 0 refills | Status: DC | PRN

## 2018-08-23 MED ORDER — SULFAMETHOXAZOLE-TRIMETHOPRIM 800-160 MG PO TABS: 1.0000 | ORAL_TABLET | Freq: Two times a day (BID) | ORAL | 0 refills | Status: DC

## 2018-08-23 MED ORDER — HYDROXYZINE HCL 25 MG PO TABS: 25.0000 mg | ORAL_TABLET | Freq: Four times a day (QID) | ORAL | 0 refills | Status: DC | PRN

## 2018-08-23 NOTE — BHH Group Notes (Signed)
The focus of this group is to help patients establish daily goals to achieve during treatment and discuss how the patient can incorporate goal setting into their daily lives to aide in recovery. ° °Pt did not attend goals group °

## 2018-08-23 NOTE — Progress Notes (Signed)
  [facility] Adult Case Management Discharge Plan :  Will you be returning to the same living situation after discharge:  Yes,  patient reports she is returning home with her significant other and children At discharge, do you have transportation home?: Yes,  patient reports her significant other is picking her up  Do you have the ability to pay for your medications: No.  Release of information consent forms completed and in the chart;  Patient's signature needed at discharge.  Patient to Follow up at: Follow-up Information    [facility] Follow up on 08/30/2018.   Why:  Hospital follow up appointment is Wednesday, 4/15 at 9:30a.  Please bring your photo ID, proof of insurance, SSN, current medications and discharge paperwork from this hospitalization.  Contact information: [place] [phone]           Next level of care provider has access to [place] and Suicide Prevention discussed: Yes,  with the patient's significant other   Have you used any form of tobacco in the last 30 days? (Cigarettes, Smokeless Tobacco, Cigars, and/or Pipes): No  Has patient been referred to the Quitline?: N/A patient is not a smoker  Patient has been referred for addiction treatment: [place], [place] 08/23/2018, 11:01 AM

## 2018-08-23 NOTE — Discharge Summary (Signed)
Physician Discharge Summary Note  Patient:  Kaitlyn Turner is an 33 y.o., female MRN:  [id number] DOB:  1986-02-16 Patient phone:  [phone] (home)  Patient address:   [place] [id number],  Total Time spent with patient: 15 minutes  Date of Admission:  08/21/2018 Date of Discharge: 08/23/18  Reason for Admission:  Suicidal ideation  Principal Problem: MDD (major depressive disorder), recurrent episode ([place]) Discharge Diagnoses: Principal Problem:   MDD (major depressive disorder), recurrent episode ([place])   Past Psychiatric History: From admission H&P: Patient had counseling with a psychologist when she was a child after the death of a relative.  No medications were prescribed.  No previous psychiatric hospitalizations  Past Medical History:  Past Medical History:  Diagnosis Date  . Hypotension   . Malignant hyperthermia   . Pulmonary embolism [facility])     Past Surgical History:  Procedure Laterality Date  . DENTAL SURGERY     Family History: History reviewed. No pertinent family history. Family Psychiatric  History: Denied Social History:  Social History   Substance and Sexual Activity  Alcohol Use Yes   Comment: occasionally     Social History   Substance and Sexual Activity  Drug Use No    Social History   Socioeconomic History  . Marital status: Single    Spouse name: Not on file  . Number of children: Not on file  . Years of education: Not on file  . Highest education level: Not on file  Occupational History  . Not on file  Social Needs  . Financial resource strain: Not on file  . Food insecurity:    Worry: Not on file    Inability: Not on file  . Transportation needs:    Medical: Not on file    Non-medical: Not on file  Tobacco Use  . Smoking status: Never Smoker  . Smokeless tobacco: Never Used  Substance and Sexual Activity  . Alcohol use: Yes    Comment: occasionally  . Drug use: No  . Sexual activity: Yes     Birth control/protection: Implant  Lifestyle  . Physical activity:    Days per week: Not on file    Minutes per session: Not on file  . Stress: Not on file  Relationships  . Social connections:    Talks on phone: Not on file    Gets together: Not on file    Attends religious service: Not on file    Active member of club or organization: Not on file    Attends meetings of clubs or organizations: Not on file    Relationship status: Not on file  Other Topics Concern  . Not on file  Social History Narrative  . Not on file    Hospital Course:  From admission H&P 08/22/2018: Patient is a 33 year old female who presented to the [facility] emergency department after being placed under involuntary commitment by local police. The patient admitted to having suicidal ideation prior to cutting herself. She cut her right wrist but was very superficial and did not require any stitches. She stated she had texted her boyfriend that if anything happened to her to take care of her children. The boyfriend became scared and called 911. She stated she had been stressed recently. She stated that she had had 2 close friends/relatives die recently. She stated after that her alcohol intake had gone up. She had been working as a Chief Operating Officer on weekends, and going to  school. She was currently not able to work because the closing her breast around secondary to coronavirus. Her blood alcohol in the emergency department was greater than 150. She stated that she is a social drinker and does not usually engage in excessive intake. She stated she had a DUI but that was 8 years ago. 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 She lives with her boyfriend. She denied any depressive symptoms. She attributes everything that occurred to her aunt increased alcohol intake. She denied any helplessness, hopelessness or worthlessness. She stated that she is interested in counseling after the hospitalization.  She was admitted to the hospital for evaluation and stabilization.  Ms. Tolin was admitted for suicidal ideation after making superficial cut to her wrist. Admission BAL 152. Patient showed no symptoms of alcohol withdrawal during admission. She denied SI throughout admission. She participated in group therapy on the unit. She remained on the [facility] unit for 1 day. She was discharged on the medications listed below. She has shown stable mood, affect, sleep, appetite, and interaction. She denies any SI/HI/AVH and contracts for safety. Collateral information was obtained from patient's boyfriend, who denies safety concerns for discharge. Patient agrees to follow up at [facility] (see below). She is provided with prescriptions for medications upon discharge. Her boyfriend is picking her up for discharge home.  Physical Findings: AIMS: Facial and Oral Movements Muscles of Facial Expression: None, normal Lips and Perioral Area: None, normal Jaw: None, normal Tongue: None, normal,Extremity Movements Upper (arms, wrists, hands, fingers): None, normal Lower (legs, knees, ankles, toes): None, normal, Trunk Movements Neck, shoulders, hips: None, normal, Overall Severity Severity of abnormal movements (highest score from questions above): None, normal Incapacitation due to abnormal movements: None, normal Patient's awareness of abnormal movements (rate only patient's report): No Awareness, Dental Status Current problems with teeth and/or dentures?: No Does patient usually wear dentures?: No  CIWA:  CIWA-Ar Total: 1 COWS:  COWS Total Score: 1  Musculoskeletal: Strength & Muscle Tone: within normal limits Gait & Station: normal Patient leans: N/A  Psychiatric Specialty Exam: Physical Exam  Nursing note and vitals reviewed. Constitutional: She is oriented to person, place, and time. She appears well-developed and well-nourished.  Cardiovascular: Normal rate.  Respiratory: Effort normal.  Neurological: She  is alert and oriented to person, place, and time.    Review of Systems  Constitutional: Negative.   Psychiatric/Behavioral: Positive for depression (improving) and substance abuse (ETOH). Negative for hallucinations, memory loss and suicidal ideas. The patient is not nervous/anxious and does not have insomnia.     Blood pressure 119/65, pulse 96, temperature 98.2 F (36.8 C), temperature source Oral, resp. rate 20, height 5\' 4"  (1.626 m), weight 80.7 kg, last menstrual period 08/06/2018, SpO2 97 %.Body mass index is 30.55 kg/m.  See MD's discharge SRA     Have you used any form of tobacco in the last 30 days? (Cigarettes, Smokeless Tobacco, Cigars, and/or Pipes): No  Has this patient used any form of tobacco in the last 30 days? (Cigarettes, Smokeless Tobacco, Cigars, and/or Pipes) No  Blood Alcohol level:  Lab Results  Component Value Date   ETH 152 (H) 46/50/3546    Metabolic Disorder Labs:  No results found for: HGBA1C, MPG No results found for: PROLACTIN No results found for: CHOL, TRIG, HDL, CHOLHDL, VLDL, LDLCALC  See Psychiatric Specialty Exam and Suicide Risk Assessment completed by Attending Physician prior to discharge.  Discharge destination:  Home  Is patient on multiple antipsychotic therapies at discharge:  No   Has Patient had three or more failed trials of antipsychotic monotherapy by history:  No  Recommended Plan for Multiple Antipsychotic Therapies: NA  Discharge Instructions    Discharge instructions   Complete by:  As directed    Patient is instructed to take all prescribed medications as recommended. Report any side effects or adverse reactions to your outpatient psychiatrist. Patient is instructed to abstain from alcohol and illegal drugs while on prescription medications. In the event of worsening symptoms, patient is instructed to call the crisis hotline, 911, or go to the nearest emergency department for evaluation and treatment.     Allergies  as of 08/23/2018      Reactions   Oxycodone Anaphylaxis   Keflex [cephalexin] Hives   Other    General anesthesia - reaction was hypothermia   Pork-derived Products Hives, Itching   Patient is allergic to pork   Toradol [ketorolac Tromethamine] Hives   Tramadol Hives      Medication List    STOP taking these medications   albuterol 108 (90 Base) MCG/ACT inhaler Commonly known as:  PROVENTIL HFA;VENTOLIN HFA   fexofenadine 180 MG tablet Commonly known as:  ALLEGRA   phenazopyridine 200 MG tablet Commonly known as:  Pyridium     TAKE these medications     Indication  hydrOXYzine 25 MG tablet Commonly known as:  ATARAX/VISTARIL Take 1 tablet (25 mg total) by mouth every 6 (six) hours as needed for anxiety.  Indication:  Feeling Anxious   sulfamethoxazole-trimethoprim 800-160 MG tablet Commonly known as:  BACTRIM DS,SEPTRA DS Take 1 tablet by mouth every 12 (twelve) hours. For urinary tract infection What changed:    when to take this  additional instructions  Indication:  Urinary Tract Infection   traZODone 50 MG tablet Commonly known as:  DESYREL Take 1 tablet (50 mg total) by mouth at bedtime and may repeat dose one time if needed. For sleep  Indication:  [facility] Follow up on 08/30/2018.   Why:  Hospital follow up appointment is Wednesday, 4/15 at 9:30a.  Please bring your photo ID, proof of insurance, SSN, current medications and discharge paperwork from this hospitalization.  Contact information: [place] [address] [phone]           Follow-up recommendations: Activity as tolerated. Diet as recommended by primary care physician. Keep all scheduled follow-up appointments as recommended.   Comments:   Patient is instructed to take all prescribed medications as recommended. Report any side effects or adverse reactions to your outpatient psychiatrist. Patient is  instructed to abstain from alcohol and illegal drugs while on prescription medications. In the event of worsening symptoms, patient is instructed to call the crisis hotline, 911, or go to the nearest emergency department for evaluation and treatment.  Signed: Connye Burkitt, NP 08/23/2018, 12:28 PM

## 2018-08-23 NOTE — [facility] Suicide Risk Assessment (Signed)
[facility] Discharge Suicide Risk Assessment   Principal Problem: <principal problem not specified> Discharge Diagnoses: Active Problems:   MDD (major depressive disorder), recurrent episode ([place])   Total Time spent with patient: 15 minutes  Musculoskeletal: Strength & Muscle Tone: within normal limits Gait & Station: normal Patient leans: N/A  Psychiatric Specialty Exam: Review of Systems  All other systems reviewed and are negative.   Blood pressure 119/65, pulse 96, temperature 98.2 F (36.8 C), temperature source Oral, resp. rate 20, height 5\' 4"  (1.626 m), weight 80.7 kg, last menstrual period 08/06/2018, SpO2 97 %.Body mass index is 30.55 kg/m.  General Appearance: Casual  Eye Contact::  Fair  Speech:  Normal Rate409  Volume:  Normal  Mood:  Euthymic  Affect:  Congruent  Thought Process:  Coherent and Descriptions of Associations: Intact  Orientation:  Full (Time, Place, and Person)  Thought Content:  Logical  Suicidal Thoughts:  No  Homicidal Thoughts:  No  Memory:  Immediate;   Fair Recent;   Fair Remote;   Fair  Judgement:  Intact  Insight:  Fair  Psychomotor Activity:  Normal  Concentration:  Good  Recall:  Good  Fund of Knowledge:Good  Language: Good  Akathisia:  Negative  Handed:  Right  AIMS (if indicated):     Assets:  Desire for Improvement Housing Physical Health Resilience Social Support  Sleep:  Number of Hours: 6.5  Cognition: WNL  ADL's:  Intact   Mental Status Per Nursing Assessment::   On Admission:  NA  Demographic Factors:  Caucasian and Low socioeconomic status  Loss Factors: NA  Historical Factors: Impulsivity  Risk Reduction Factors:   Responsible for children under 78 years of age, Sense of responsibility to family, Employed, Living with another person, especially a relative and Positive social support  Continued Clinical Symptoms:  Depression:   Impulsivity Alcohol/Substance Abuse/Dependencies  Cognitive Features That  Contribute To Risk:  None    Suicide Risk:  Minimal: No identifiable suicidal ideation.  Patients presenting with no risk factors but with morbid ruminations; may be classified as minimal risk based on the severity of the depressive symptoms  Follow-up Information    [place] Follow up on 08/30/2018.   Why:  Hospital follow up appointment is Wednesday, 4/15 at 9:30a.  Please bring your photo ID, proof of insurance, SSN, current medications and discharge paperwork from this hospitalization.  Contact information: [place] [address] [phone]           Plan Of Care/Follow-up recommendations:  Activity:  ad lib  Sharma Covert, MD 08/23/2018, 8:12 AM

## 2018-08-23 NOTE — Tx Team (Signed)
Interdisciplinary Treatment and Diagnostic Plan Update  08/23/2018 Time of Session:  Kaitlyn Turner MRN: [id number]  Principal Diagnosis: <principal problem not specified>  Secondary Diagnoses: Active Problems:   MDD (major depressive disorder), recurrent episode ([place])   Current Medications:  Current Facility-Administered Medications  Medication Dose Route Frequency Provider Last Rate Last Dose  . acetaminophen (TYLENOL) tablet 650 mg  650 mg Oral Q6H PRN Derrill Center, NP      . alum & mag hydroxide-simeth (MAALOX/MYLANTA) 200-200-20 MG/5ML suspension 30 mL  30 mL Oral Q4H PRN Derrill Center, NP      . folic acid (FOLVITE) tablet 1 mg  1 mg Oral Daily Sharma Covert, MD   1 mg at 08/23/18 6967  . hydrOXYzine (ATARAX/VISTARIL) tablet 25 mg  25 mg Oral Q6H PRN Sharma Covert, MD      . hydrOXYzine (ATARAX/VISTARIL) tablet 25 mg  25 mg Oral QHS PRN Sharma Covert, MD      . magnesium hydroxide (MILK OF MAGNESIA) suspension 30 mL  30 mL Oral Daily PRN Derrill Center, NP      . sulfamethoxazole-trimethoprim (BACTRIM DS,SEPTRA DS) 800-160 MG per tablet 1 tablet  1 tablet Oral Q12H Derrill Center, NP   1 tablet at 08/23/18 0736  . thiamine (VITAMIN B-1) tablet 100 mg  100 mg Oral Daily Sharma Covert, MD   100 mg at 08/23/18 0736  . traZODone (DESYREL) tablet 50 mg  50 mg Oral QHS,MR X 1 Laverle Hobby, PA-C   50 mg at 08/22/18 2133   PTA Medications: Medications Prior to Admission  Medication Sig Dispense Refill Last Dose  . fexofenadine (ALLEGRA) 180 MG tablet Take 180 mg by mouth daily as needed for allergies or rhinitis.     Marland Kitchen albuterol (PROVENTIL HFA;VENTOLIN HFA) 108 (90 Base) MCG/ACT inhaler Inhale 2 puffs into the lungs every 6 (six) hours as needed. (Patient not taking: Reported on 08/22/2018) 1 Inhaler 0 Completed Course at Unknown time  . phenazopyridine (PYRIDIUM) 200 MG tablet Take 1 tablet (200 mg total) by mouth 3 (three) times daily as needed for pain.  (Patient not taking: Reported on 08/22/2018) 6 tablet 0 Completed Course at Unknown time  . sulfamethoxazole-trimethoprim (BACTRIM DS,SEPTRA DS) 800-160 MG tablet Take 1 tablet by mouth 2 (two) times daily. (Patient not taking: Reported on 08/22/2018) 10 tablet 0 Completed Course at Unknown time    Patient Stressors: Loss of cousin and best friend  Patient Strengths: Ability for insight Average or above average intelligence Capable of independent living Occupational psychologist fund of knowledge Motivation for treatment/growth Physical Health Supportive family/friends Work skills  Treatment Modalities: Medication Management, Group therapy, Case management,  1 to 1 session with clinician, Psychoeducation, Recreational therapy.   Physician Treatment Plan for Primary Diagnosis: <principal problem not specified> Long Term Goal(s): Improvement in symptoms so as ready for discharge Improvement in symptoms so as ready for discharge   Short Term Goals: Ability to identify changes in lifestyle to reduce recurrence of condition will improve Ability to verbalize feelings will improve Ability to disclose and discuss suicidal ideas Ability to demonstrate self-control will improve Ability to identify and develop effective coping behaviors will improve Ability to maintain clinical measurements within normal limits will improve Ability to identify triggers associated with substance abuse/mental health issues will improve Ability to identify changes in lifestyle to reduce recurrence of condition will improve Ability to verbalize feelings will improve Ability to disclose and discuss  suicidal ideas Ability to demonstrate self-control will improve Ability to identify and develop effective coping behaviors will improve Ability to maintain clinical measurements within normal limits will improve Ability to identify triggers associated with substance abuse/mental health issues will  improve  Medication Management: Evaluate patient's response, side effects, and tolerance of medication regimen.  Therapeutic Interventions: 1 to 1 sessions, Unit Group sessions and Medication administration.  Evaluation of Outcomes: Adequate for Discharge  Physician Treatment Plan for Secondary Diagnosis: Active Problems:   MDD (major depressive disorder), recurrent episode ([place])  Long Term Goal(s): Improvement in symptoms so as ready for discharge Improvement in symptoms so as ready for discharge   Short Term Goals: Ability to identify changes in lifestyle to reduce recurrence of condition will improve Ability to verbalize feelings will improve Ability to disclose and discuss suicidal ideas Ability to demonstrate self-control will improve Ability to identify and develop effective coping behaviors will improve Ability to maintain clinical measurements within normal limits will improve Ability to identify triggers associated with substance abuse/mental health issues will improve Ability to identify changes in lifestyle to reduce recurrence of condition will improve Ability to verbalize feelings will improve Ability to disclose and discuss suicidal ideas Ability to demonstrate self-control will improve Ability to identify and develop effective coping behaviors will improve Ability to maintain clinical measurements within normal limits will improve Ability to identify triggers associated with substance abuse/mental health issues will improve     Medication Management: Evaluate patient's response, side effects, and tolerance of medication regimen.  Therapeutic Interventions: 1 to 1 sessions, Unit Group sessions and Medication administration.  Evaluation of Outcomes: Adequate for Discharge   RN Treatment Plan for Primary Diagnosis: <principal problem not specified> Long Term Goal(s): Knowledge of disease and therapeutic regimen to maintain health will improve  Short Term Goals:  Ability to participate in decision making will improve, Ability to verbalize feelings will improve, Ability to disclose and discuss suicidal ideas, Ability to identify and develop effective coping behaviors will improve and Compliance with prescribed medications will improve  Medication Management: RN will administer medications as ordered by provider, will assess and evaluate patient's response and provide education to patient for prescribed medication. RN will report any adverse and/or side effects to prescribing provider.  Therapeutic Interventions: 1 on 1 counseling sessions, Psychoeducation, Medication administration, Evaluate responses to treatment, Monitor vital signs and CBGs as ordered, Perform/monitor CIWA, COWS, AIMS and Fall Risk screenings as ordered, Perform wound care treatments as ordered.  Evaluation of Outcomes: Adequate for Discharge   LCSW Treatment Plan for Primary Diagnosis: <principal problem not specified> Long Term Goal(s): Safe transition to appropriate next level of care at discharge, Engage patient in therapeutic group addressing interpersonal concerns.  Short Term Goals: Engage patient in aftercare planning with referrals and resources  Therapeutic Interventions: Assess for all discharge needs, 1 to 1 time with Social worker, Explore available resources and support systems, Assess for adequacy in community support network, Educate family and significant other(s) on suicide prevention, Complete Psychosocial Assessment, Interpersonal group therapy.  Evaluation of Outcomes: Adequate for Discharge   Progress in Treatment: Attending groups: Yes. Participating in groups: Yes. Taking medication as prescribed: Yes. Toleration medication: Yes. Family/Significant other contact made: Yes, individual(s) contacted:  the patient's spouse Patient understands diagnosis: Yes. Discussing patient identified problems/goals with staff: Yes. Medical problems stabilized or resolved:  Yes. Denies suicidal/homicidal ideation: Yes. Issues/concerns per patient self-inventory: No. Other:  New problem(s) identified: None   New Short Term/Long Term Goal(s):medication stabilization, elimination of  SI thoughts, development of comprehensive mental wellness plan.    Patient Goals:     Discharge Plan or Barriers: Patient is discharging home with her spouse and children. She plans to follow up with [facility] for outpatient medication management and therapy services.   Reason for Continuation of Hospitalization: None   Estimated Length of Stay: 08/23/2018  Attendees: Patient: 08/23/2018 11:11 AM  Physician: Dr. Myles Lipps, MD 08/23/2018 11:11 AM  Nursing: Legrand Como.Chauncey Cruel, RN 08/23/2018 11:11 AM  RN Care Manager: 08/23/2018 11:11 AM  Social Worker: Radonna Ricker, [place] 08/23/2018 11:11 AM  Recreational Therapist:  08/23/2018 11:11 AM  Other:  08/23/2018 11:11 AM  Other:  08/23/2018 11:11 AM  Other: 08/23/2018 11:11 AM    Scribe for Treatment Team: Marylee Floras, [place] 08/23/2018 11:11 AM

## 2018-08-23 NOTE — Progress Notes (Signed)
Recreation Therapy Notes  Date:  4.8.20 Time: 0930 Location: [address] Dayroom  Group Topic: Stress Management  Goal Area(s) Addresses:  Patient will identify positive stress management techniques. Patient will identify benefits of using stress management post d/c.  Intervention: Stress Management  Activity :  Meditation.  LRT introduced the stress management technique of meditation.  LRT played a meditation that focused on making the most of your day and the possibilities it offers.  Patients were to follow along as meditation played to engage in activity.  Education:  Stress Management, Discharge Planning.   Education Outcome: Acknowledges Education  Clinical Observations/Feedback:  Pt did not attend group.     Victorino Sparrow, LRT/CTRS         Victorino Sparrow A 08/23/2018 10:38 AM

## 2018-08-23 NOTE — Plan of Care (Signed)
Discharge note  Patient verbalizes readiness for discharge. Follow up plan explained, AVS, Transition record and SRA given. Prescriptions and teaching provided. Belongings returned and signed for. Suicide safety plan completed and signed. Patient verbalizes understanding. Patient denies SI/HI and assures this writer she will seek assistance should that change. Patient discharged to lobby where boyfriend was waiting.  Problem: Education: Goal: Utilization of techniques to improve thought processes will improve Outcome: Adequate for Discharge Goal: Knowledge of the prescribed therapeutic regimen will improve Outcome: Adequate for Discharge   Problem: Activity: Goal: Interest or engagement in leisure activities will improve Outcome: Adequate for Discharge Goal: Imbalance in normal sleep/wake cycle will improve Outcome: Adequate for Discharge   Problem: Coping: Goal: Coping ability will improve Outcome: Adequate for Discharge Goal: Will verbalize feelings Outcome: Adequate for Discharge   Problem: Health Behavior/Discharge Planning: Goal: Ability to make decisions will improve Outcome: Adequate for Discharge Goal: Compliance with therapeutic regimen will improve Outcome: Adequate for Discharge   Problem: Role Relationship: Goal: Will demonstrate positive changes in social behaviors and relationships Outcome: Adequate for Discharge   Problem: Safety: Goal: Ability to disclose and discuss suicidal ideas will improve Outcome: Adequate for Discharge Goal: Ability to identify and utilize support systems that promote safety will improve Outcome: Adequate for Discharge   Problem: Self-Concept: Goal: Will verbalize positive feelings about self Outcome: Adequate for Discharge Goal: Level of anxiety will decrease Outcome: Adequate for Discharge   Problem: Education: Goal: Knowledge of [facility] Education information/materials will improve Outcome: Adequate for  Discharge Goal: Emotional status will improve Outcome: Adequate for Discharge Goal: Mental status will improve Outcome: Adequate for Discharge Goal: Verbalization of understanding the information provided will improve Outcome: Adequate for Discharge   Problem: Activity: Goal: Interest or engagement in activities will improve Outcome: Adequate for Discharge Goal: Sleeping patterns will improve Outcome: Adequate for Discharge   Problem: Coping: Goal: Ability to verbalize frustrations and anger appropriately will improve Outcome: Adequate for Discharge Goal: Ability to demonstrate self-control will improve Outcome: Adequate for Discharge   Problem: Health Behavior/Discharge Planning: Goal: Identification of resources available to assist in meeting health care needs will improve Outcome: Adequate for Discharge Goal: Compliance with treatment plan for underlying cause of condition will improve Outcome: Adequate for Discharge   Problem: Physical Regulation: Goal: Ability to maintain clinical measurements within normal limits will improve Outcome: Adequate for Discharge   Problem: Safety: Goal: Periods of time without injury will increase Outcome: Adequate for Discharge   Problem: Education: Goal: Ability to make informed decisions regarding treatment will improve Outcome: Adequate for Discharge   Problem: Coping: Goal: Coping ability will improve Outcome: Adequate for Discharge   Problem: Health Behavior/Discharge Planning: Goal: Identification of resources available to assist in meeting health care needs will improve Outcome: Adequate for Discharge   Problem: Medication: Goal: Compliance with prescribed medication regimen will improve Outcome: Adequate for Discharge   Problem: Self-Concept: Goal: Ability to disclose and discuss suicidal ideas will improve Outcome: Adequate for Discharge Goal: Will verbalize positive feelings about self Outcome: Adequate for  Discharge

## 2018-09-06 DIAGNOSIS — F4323 Adjustment disorder with mixed anxiety and depressed mood: Secondary | ICD-10-CM

## 2018-09-06 DIAGNOSIS — Z811 Family history of alcohol abuse and dependence: Secondary | ICD-10-CM

## 2018-09-06 DIAGNOSIS — Z635 Disruption of family by separation and divorce: Secondary | ICD-10-CM

## 2018-11-07 ENCOUNTER — Emergency Department: Payer: Medicaid Other

## 2018-11-07 ENCOUNTER — Encounter: Payer: Self-pay | Admitting: Emergency Medicine

## 2018-11-07 ENCOUNTER — Other Ambulatory Visit: Payer: Self-pay

## 2018-11-07 ENCOUNTER — Emergency Department
Admission: EM | Admit: 2018-11-07 | Discharge: 2018-11-07 | Disposition: A | Discharge status: Home / Self Care | Payer: Medicaid Other | Attending: Emergency Medicine | Admitting: Emergency Medicine

## 2018-11-07 DIAGNOSIS — Y939 Activity, unspecified: Secondary | ICD-10-CM | POA: Diagnosis not present

## 2018-11-07 DIAGNOSIS — Y998 Other external cause status: Secondary | ICD-10-CM | POA: Insufficient documentation

## 2018-11-07 DIAGNOSIS — Y929 Unspecified place or not applicable: Secondary | ICD-10-CM | POA: Diagnosis not present

## 2018-11-07 DIAGNOSIS — R2242 Localized swelling, mass and lump, left lower limb: Secondary | ICD-10-CM | POA: Diagnosis not present

## 2018-11-07 DIAGNOSIS — S93402A Sprain of unspecified ligament of left ankle, initial encounter: Secondary | ICD-10-CM | POA: Diagnosis not present

## 2018-11-07 DIAGNOSIS — X509XXA Other and unspecified overexertion or strenuous movements or postures, initial encounter: Secondary | ICD-10-CM | POA: Diagnosis not present

## 2018-11-07 DIAGNOSIS — S99912A Unspecified injury of left ankle, initial encounter: Secondary | ICD-10-CM | POA: Diagnosis present

## 2018-11-07 MED ORDER — IBUPROFEN 600 MG PO TABS: 600.0000 mg | ORAL_TABLET | Freq: Three times a day (TID) | ORAL | 0 refills | Status: DC | PRN

## 2018-11-07 NOTE — ED Triage Notes (Signed)
Pt reports twisted her left ankle helping her sister working on the floor yesterday and now painful to walk on.

## 2018-11-07 NOTE — ED Provider Notes (Signed)
[facility] Emergency Department Provider Note  ____________________________________________   First MD Initiated Contact with Patient 11/07/18 1240     (approximate)  I have reviewed the triage vital signs and the nursing notes.   HISTORY  Chief Complaint Ankle Pain   HPI WILLODENE STALLINGS is a 33 y.o. female presents to the ED with complaint of left ankle pain.  Patient states that she twisted her ankle yesterday and today is having increased pain and swelling.  She took ibuprofen last evening.  She denies any previous injury to her ankle.  She rates her pain as 7 out of 10.      Past Medical History:  Diagnosis Date  . Family history of alcoholism 01/08/2015   Per Centricity  . Hypotension   . Malignant hyperthermia   . Pulmonary embolism [facility])     Patient Active Problem List   Diagnosis Date Noted  . MDD (major depressive disorder), recurrent episode ([place]) 08/21/2018  . Suicidal ideation 08/20/2018  . Family disruption due to divorce or legal separation 06/03/2015  . Adjustment disorder with mixed anxiety and depressed mood 06/03/2015  . Family history of alcoholism 01/08/2015    Past Surgical History:  Procedure Laterality Date  . DENTAL SURGERY      Prior to Admission medications   Medication Sig Start Date End Date Taking? Authorizing Provider  etonogestrel (NEXPLANON) 68 MG IMPL implant 1 each by Subdermal route once. 07/21/16 07/22/19  Jerene Dilling, PA  hydrOXYzine (ATARAX/VISTARIL) 25 MG tablet Take 1 tablet (25 mg total) by mouth every 6 (six) hours as needed for anxiety. 08/23/18   Connye Burkitt, NP  ibuprofen (ADVIL) 600 MG tablet Take 1 tablet (600 mg total) by mouth every 8 (eight) hours as needed. 11/07/18   Johnn Hai, PA-C  traZODone (DESYREL) 50 MG tablet Take 1 tablet (50 mg total) by mouth at bedtime and may repeat dose one time if needed. For sleep 08/23/18   Connye Burkitt, NP    Allergies Oxycodone, Stadol  [butorphanol], Keflex [cephalexin], Other, Pork-derived products, Toradol [ketorolac tromethamine], and Tramadol  No family history on file.  Social History Social History   Tobacco Use  . Smoking status: Never Smoker  . Smokeless tobacco: Never Used  Substance Use Topics  . Alcohol use: Yes    Comment: occasionally  . Drug use: No    Review of Systems Constitutional: No fever/chills E yes: No visual changes. Cardiovascular: Denies chest pain. Respiratory: Denies shortness of breath. Gastrointestinal:.  No nausea, no vomiting.  Musculoskeletal: Negative for back pain. Skin: Negative for rash. Neurological: Negative for  focal weakness or numbness. ___________________________________________   PHYSICAL EXAM:  VITAL SIGNS: ED Triage Vitals  Enc Vitals Group     BP 11/07/18 1226 120/73     Pulse Rate 11/07/18 1226 80     Resp 11/07/18 1226 20     Temp 11/07/18 1226 98.2 F (36.8 C)     Temp Source 11/07/18 1226 Oral     SpO2 11/07/18 1226 97 %     Weight 11/07/18 1227 170 lb (77.1 kg)     Height 11/07/18 1227 5\' 6"  (1.676 m)     Head Circumference --      Peak Flow --      Pain Score 11/07/18 1227 7     Pain Loc --      Pain Edu? --      Excl. in [place]? --    Constitutional:  Alert and oriented. Well appearing and in no acute distress. Eyes: Conjunctivae are normal.  Head: Atraumatic. Neck: No stridor.   Cardiovascular: Normal rate, regular rhythm. Grossly normal heart sounds.  Good peripheral circulation. Respiratory: Normal respiratory effort.  No retractions. Lungs CTAB. Musculoskeletal: Left ankle with moderate tenderness on palpation of the lateral malleolus without obvious deformity.  Range of motion is limited secondary to pain but patient is able to flex and extend.  Skin is intact.  Pulse present.  Motor sensory function intact.  Capillary refill distal to the injury is less than 3 seconds. Neurologic:  Normal speech and language. No gross focal neurologic  deficits are appreciated. No gait instability. Skin:  Skin is warm, dry and intact. No rash noted. Psychiatric: Mood and affect are normal. Speech and behavior are normal.  ____________________________________________   LABS (all labs ordered are listed, but only abnormal results are displayed)  Labs Reviewed - No data to display  RADIOLOGY  ED MD interpretation:  Left ankle x-ray is negative for acute bony injury.  Official radiology report(s): Dg Ankle Complete Left  Result Date: 11/07/2018 CLINICAL DATA:  Left lateral malleolus pain and bruising after externally twisting left foot yesterday. EXAM: LEFT ANKLE COMPLETE - 3+ VIEW COMPARISON:  None. FINDINGS: There is no evidence of fracture, dislocation, or joint effusion. There is no evidence of arthropathy or other focal bone abnormality. Soft tissues are unremarkable. IMPRESSION: Negative. Electronically Signed   By: Lucrezia Europe M.D.   On: 11/07/2018 14:00    ____________________________________________   PROCEDURES  Procedure(s) performed (including Critical Care):  Procedures Ace wrap and ankle stirrup splint was applied by ED tech.  ____________________________________________   INITIAL IMPRESSION / ASSESSMENT AND PLAN / ED COURSE  As part of my medical decision making, I reviewed the following data within the electronic MEDICAL RECORD NUMBER Notes from prior ED visits and [place] Controlled Substance Database  33 year old female presents to the ED for a injury to her left ankle that occurred yesterday.  Patient complains of swelling and increased pain with weightbearing.  Physical exam is consistent with a sprain to the left ankle and x-rays were negative for acute bony injury.  Patient was placed into Ace wrap and ankle stirrup splint.  She was also given crutches.  A prescription for ibuprofen was sent to her pharmacy.  She was given instructions to ice and elevate as needed for pain and swelling.    ____________________________________________   FINAL CLINICAL IMPRESSION(S) / ED DIAGNOSES  Final diagnoses:  Sprain of left ankle, unspecified ligament, initial encounter     ED Discharge Orders         Ordered    ibuprofen (ADVIL) 600 MG tablet  Every 8 hours PRN,   Status:  Discontinued     11/07/18 1434    ibuprofen (ADVIL) 600 MG tablet  Every 8 hours PRN     11/07/18 1435           Note:  This document was prepared using Dragon voice recognition software and may include unintentional dictation errors.    Johnn Hai, PA-C 11/07/18 1443    Lavonia Drafts, MD 11/07/18 [phone]

## 2018-11-07 NOTE — ED Notes (Signed)
See triage note  Presents with pain and swelling to left ankle  States she twisted ankle yesterday

## 2018-11-07 NOTE — Discharge Instructions (Addendum)
Follow-up with Dr. Posey Pronto if any continued problems.  Use crutches for support.  Ice and elevation.  Ace wrap and stirrup splint as needed for protection and added support.  Continue taking ibuprofen 600 mg 3 times daily with food.  A note was written for school in the event that is worse tomorrow and you are unable to go.

## 2018-11-23 ENCOUNTER — Emergency Department
Admission: EM | Admit: 2018-11-23 | Discharge: 2018-11-24 | Disposition: A | Discharge status: Home / Self Care | Payer: Medicaid Other | Attending: Emergency Medicine | Admitting: Emergency Medicine

## 2018-11-23 ENCOUNTER — Other Ambulatory Visit: Payer: Self-pay

## 2018-11-23 ENCOUNTER — Emergency Department: Payer: Medicaid Other

## 2018-11-23 DIAGNOSIS — S61305A Unspecified open wound of left ring finger with damage to nail, initial encounter: Secondary | ICD-10-CM | POA: Diagnosis not present

## 2018-11-23 DIAGNOSIS — Y999 Unspecified external cause status: Secondary | ICD-10-CM | POA: Diagnosis not present

## 2018-11-23 DIAGNOSIS — Y939 Activity, unspecified: Secondary | ICD-10-CM | POA: Insufficient documentation

## 2018-11-23 DIAGNOSIS — Y929 Unspecified place or not applicable: Secondary | ICD-10-CM | POA: Insufficient documentation

## 2018-11-23 DIAGNOSIS — W010XXA Fall on same level from slipping, tripping and stumbling without subsequent striking against object, initial encounter: Secondary | ICD-10-CM | POA: Diagnosis not present

## 2018-11-23 DIAGNOSIS — Z79899 Other long term (current) drug therapy: Secondary | ICD-10-CM | POA: Diagnosis not present

## 2018-11-23 DIAGNOSIS — S6992XA Unspecified injury of left wrist, hand and finger(s), initial encounter: Secondary | ICD-10-CM | POA: Diagnosis present

## 2018-11-23 DIAGNOSIS — S61309A Unspecified open wound of unspecified finger with damage to nail, initial encounter: Secondary | ICD-10-CM

## 2018-11-23 MED ORDER — LIDOCAINE HCL (PF) 1 % IJ SOLN
2.0000 mL | Freq: Once | INTRAMUSCULAR | Status: AC
  Administered 2018-11-23: 2 mL
  Filled 2018-11-23: qty 5

## 2018-11-23 MED ORDER — SULFAMETHOXAZOLE-TRIMETHOPRIM 800-160 MG PO TABS
1.0000 | ORAL_TABLET | Freq: Once | ORAL | Status: AC
  Administered 2018-11-23: 1 via ORAL
  Filled 2018-11-23: qty 1

## 2018-11-23 MED ORDER — SULFAMETHOXAZOLE-TRIMETHOPRIM 800-160 MG PO TABS: 1.0000 | ORAL_TABLET | Freq: Two times a day (BID) | ORAL | 0 refills | Status: DC

## 2018-11-23 NOTE — Discharge Instructions (Addendum)
Please keep finger clean and dry.  You may shower but do not submerge underwater.  Change Band-Aid daily.  Take antibiotics as prescribed.  Return to the ER for any increasing pain swelling warmth redness.

## 2018-11-23 NOTE — ED Provider Notes (Signed)
[facility] EMERGENCY DEPARTMENT Provider Note   CSN: [id number] Arrival date & time: 11/23/18  1907    History   Chief Complaint Chief Complaint  Patient presents with  . Finger Injury    HPI Kaitlyn Turner is a 33 y.o. female.  Presents to the emerge department evaluation of left ring finger injury.  Injury occurred yesterday.  Patient states she tripped and fell catching her fake nail on the step causing her nail to the partially removed.  She tried to have this fixed at the nail salon but they were unable to do this due to pain.  She is concerned she may have an infection due to pus under the nail.  Pain is moderate.     HPI  Past Medical History:  Diagnosis Date  . Family history of alcoholism 01/08/2015   Per Centricity  . Hypotension   . Malignant hyperthermia   . Pulmonary embolism [facility])     Patient Active Problem List   Diagnosis Date Noted  . MDD (major depressive disorder), recurrent episode ([place]) 08/21/2018  . Suicidal ideation 08/20/2018  . Family disruption due to divorce or legal separation 06/03/2015  . Adjustment disorder with mixed anxiety and depressed mood 06/03/2015  . Family history of alcoholism 01/08/2015    Past Surgical History:  Procedure Laterality Date  . DENTAL SURGERY       OB History    Gravida  8   Para  3   Term      Preterm      AB  5   Living  3     SAB      TAB      Ectopic      Multiple      Live Births               Home Medications    Prior to Admission medications   Medication Sig Start Date End Date Taking? Authorizing Provider  etonogestrel (NEXPLANON) 68 MG IMPL implant 1 each by Subdermal route once. 07/21/16 07/22/19  Jerene Dilling, PA  hydrOXYzine (ATARAX/VISTARIL) 25 MG tablet Take 1 tablet (25 mg total) by mouth every 6 (six) hours as needed for anxiety. 08/23/18   Connye Burkitt, NP  ibuprofen (ADVIL) 600 MG tablet Take 1 tablet (600 mg total) by mouth every 8 (eight)  hours as needed. 11/07/18   Johnn Hai, PA-C  sulfamethoxazole-trimethoprim (BACTRIM DS) 800-160 MG tablet Take 1 tablet by mouth 2 (two) times daily. 11/23/18   Duanne Guess, PA-C  traZODone (DESYREL) 50 MG tablet Take 1 tablet (50 mg total) by mouth at bedtime and may repeat dose one time if needed. For sleep 08/23/18   Connye Burkitt, NP    Family History No family history on file.  Social History Social History   Tobacco Use  . Smoking status: Never Smoker  . Smokeless tobacco: Never Used  Substance Use Topics  . Alcohol use: Yes    Comment: occasionally  . Drug use: No     Allergies   Oxycodone, Stadol [butorphanol], Keflex [cephalexin], Other, Pork-derived products, Toradol [ketorolac tromethamine], and Tramadol   Review of Systems Review of Systems  Constitutional: Negative for fever.  Musculoskeletal: Positive for arthralgias.  Skin: Positive for wound. Negative for rash.  Neurological: Negative for numbness.     Physical Exam Updated Vital Signs BP 119/70 (BP Location: Right Arm)   Pulse 60   Temp 98.5 F (36.9 C) (Oral)  Resp 16   Ht 5\' 6"  (1.676 m)   Wt 80.3 kg   SpO2 97%   BMI 28.57 kg/m   Physical Exam Constitutional:      Appearance: She is well-developed.  HENT:     Head: Normocephalic and atraumatic.  Eyes:     Conjunctiva/sclera: Conjunctivae normal.  Neck:     Musculoskeletal: Normal range of motion.  Cardiovascular:     Rate and Rhythm: Normal rate.  Pulmonary:     Effort: Pulmonary effort is normal. No respiratory distress.  Musculoskeletal: Normal range of motion.     Comments: Left ring finger with nail avulsion at the base of the nail, 75% of the nail has been removed from the nailbed.  Mild erythema.  No pus.  No swelling.  Minimally tender to palpation.  Skin:    General: Skin is warm.     Findings: No rash.  Neurological:     Mental Status: She is alert and oriented to person, place, and time.  Psychiatric:         Behavior: Behavior normal.        Thought Content: Thought content normal.      ED Treatments / Results  Labs (all labs ordered are listed, but only abnormal results are displayed) Labs Reviewed - No data to display  EKG None  Radiology Dg Finger Ring Left  Result Date: 11/23/2018 CLINICAL DATA:  Pain status post fall EXAM: LEFT RING FINGER 2+V COMPARISON:  None. FINDINGS: There is no evidence of fracture or dislocation. There is no evidence of arthropathy or other focal bone abnormality. Soft tissues are unremarkable. IMPRESSION: Negative. Electronically Signed   By: Constance Holster M.D.   On: 11/23/2018 19:57    Procedures .Nail Removal  Date/Time: 11/23/2018 11:46 PM Performed by: Duanne Guess, PA-C Authorized by: Duanne Guess, PA-C   Consent:    Consent obtained:  Verbal   Consent given by:  Patient Location:    Hand:  L ring finger Pre-procedure details:    Skin preparation:  Betadine Anesthesia (see MAR for exact dosages):    Anesthesia method:  Nerve block   Block needle gauge:  25 G   Block anesthetic:  Lidocaine 1% w/o epi   Block technique:  Vulvar   Block injection procedure:  Anatomic landmarks identified   Block outcome:  Anesthesia achieved Nail Removal:    Nail removed:  Complete   Nail bed repaired: no     Removed nail replaced and anchored: yes   Trephination:    Subungual hematoma drained: no   Post-procedure details:    Dressing:  Non-adhesive packing strip   Patient tolerance of procedure:  Tolerated well, no immediate complications   (including critical care time)  Medications Ordered in ED Medications  sulfamethoxazole-trimethoprim (BACTRIM DS) 800-160 MG per tablet 1 tablet (has no administration in time range)  lidocaine (PF) (XYLOCAINE) 1 % injection 2 mL (2 mLs Infiltration Given 11/23/18 2230)     Initial Impression / Assessment and Plan / ED Course  I have reviewed the triage vital signs and the nursing notes.  Pertinent  labs & imaging results that were available during my care of the patient were reviewed by me and considered in my medical decision making (see chart for details).        33 year old female with partial nail avulsion to the left ring finger.  X-ray showed no evidence of acute bony abnormality.  Nail was completely removed after successful digital block  to evaluate the nailbed, no lacerations or foreign body visualized.  No signs of abscess formation.  Patient nail and finger thoroughly irrigated and cleansed with Betadine and saline then nail placed and reattached with Vicryl sutures.  Patient placed on Bactrim for prophylactic antibiotics.  She is educated on wound care.  She understands signs and symptoms return to ED for. Final Clinical Impressions(s) / ED Diagnoses   Final diagnoses:  Fingernail avulsion, partial, initial encounter    ED Discharge Orders         Ordered    sulfamethoxazole-trimethoprim (BACTRIM DS) 800-160 MG tablet  2 times daily     11/23/18 2344           Renata Caprice 11/23/18 2349    Duffy Bruce, MD 11/26/18 [phone]

## 2018-11-23 NOTE — ED Triage Notes (Signed)
Pt to the er for pain to the 4th digit on the left hand. Pt reports falling down some steps while carrying groceries and injured her finger. Pt has bruising to the left forearm but denies any other pain. Limited ROM to affected digit.

## 2019-01-06 ENCOUNTER — Ambulatory Visit
Admission: EM | Admit: 2019-01-06 | Discharge: 2019-01-06 | Disposition: A | Discharge status: Nursing Home (Includes ICF) | Payer: Medicaid Other | Source: Home / Self Care

## 2019-01-06 ENCOUNTER — Observation Stay
Admission: EM | Admit: 2019-01-06 | Discharge: 2019-01-07 | Disposition: A | Discharge status: Home / Self Care | Payer: Medicaid Other | Attending: Internal Medicine | Admitting: Internal Medicine

## 2019-01-06 ENCOUNTER — Emergency Department: Payer: Medicaid Other

## 2019-01-06 ENCOUNTER — Encounter: Payer: Self-pay | Admitting: Emergency Medicine

## 2019-01-06 ENCOUNTER — Encounter: Payer: Self-pay | Admitting: Radiology

## 2019-01-06 ENCOUNTER — Other Ambulatory Visit: Payer: Self-pay

## 2019-01-06 DIAGNOSIS — Z20828 Contact with and (suspected) exposure to other viral communicable diseases: Secondary | ICD-10-CM | POA: Insufficient documentation

## 2019-01-06 DIAGNOSIS — R9431 Abnormal electrocardiogram [ECG] [EKG]: Secondary | ICD-10-CM | POA: Insufficient documentation

## 2019-01-06 DIAGNOSIS — R000 Tachycardia, unspecified: Secondary | ICD-10-CM | POA: Diagnosis not present

## 2019-01-06 DIAGNOSIS — R0602 Shortness of breath: Secondary | ICD-10-CM | POA: Insufficient documentation

## 2019-01-06 DIAGNOSIS — R079 Chest pain, unspecified: Secondary | ICD-10-CM

## 2019-01-06 DIAGNOSIS — Z86711 Personal history of pulmonary embolism: Secondary | ICD-10-CM | POA: Insufficient documentation

## 2019-01-06 DIAGNOSIS — Z23 Encounter for immunization: Secondary | ICD-10-CM | POA: Diagnosis not present

## 2019-01-06 DIAGNOSIS — R002 Palpitations: Secondary | ICD-10-CM | POA: Insufficient documentation

## 2019-01-06 DIAGNOSIS — Z79899 Other long term (current) drug therapy: Secondary | ICD-10-CM | POA: Diagnosis not present

## 2019-01-06 DIAGNOSIS — F4323 Adjustment disorder with mixed anxiety and depressed mood: Secondary | ICD-10-CM | POA: Diagnosis not present

## 2019-01-06 LAB — COMPREHENSIVE METABOLIC PANEL
ALT: 16 U/L (ref 0–44)
AST: 19 U/L (ref 15–41)
Albumin: 4.5 g/dL (ref 3.5–5.0)
Alkaline Phosphatase: 60 U/L (ref 38–126)
Anion gap: 8 (ref 5–15)
BUN: 15 mg/dL (ref 6–20)
CO2: 24 mmol/L (ref 22–32)
Calcium: 9.6 mg/dL (ref 8.9–10.3)
Chloride: 106 mmol/L (ref 98–111)
Creatinine, Ser: 0.76 mg/dL (ref 0.44–1.00)
GFR calc Af Amer: >60 mL/min (ref >60)
GFR calc non Af Amer: >60 mL/min (ref >60)
Glucose, Bld: 99 mg/dL (ref 70–99)
Potassium: 3.9 mmol/L (ref 3.5–5.1)
Sodium: 138 mmol/L (ref 135–145)
Total Bilirubin: 1.2 mg/dL (ref 0.3–1.2)
Total Protein: 8.2 g/dL — ABNORMAL HIGH (ref 6.5–8.1)

## 2019-01-06 LAB — TROPONIN I (HIGH SENSITIVITY)
Troponin I (High Sensitivity): 26 ng/L — ABNORMAL HIGH (ref <18)
Troponin I (High Sensitivity): 22 ng/L — ABNORMAL HIGH (ref <18)

## 2019-01-06 LAB — CBC
HCT: 41.9 % (ref 36.0–46.0)
Hemoglobin: 14.4 g/dL (ref 12.0–15.0)
MCH: 32.3 pg (ref 26.0–34.0)
MCHC: 34.4 g/dL (ref 30.0–36.0)
MCV: 93.9 fL (ref 80.0–100.0)
Platelets: 311 10*3/uL (ref 150–400)
RBC: 4.46 MIL/uL (ref 3.87–5.11)
RDW: 11.9 % (ref 11.5–15.5)
WBC: 11.2 10*3/uL — ABNORMAL HIGH (ref 4.0–10.5)
nRBC: 0 % (ref 0.0–0.2)

## 2019-01-06 LAB — POCT PREGNANCY, URINE: Preg Test, Ur: NEGATIVE

## 2019-01-06 MED ORDER — ONDANSETRON HCL 4 MG/2ML IJ SOLN: 4.0000 mg | Freq: Four times a day (QID) | INTRAMUSCULAR | Status: DC | PRN

## 2019-01-06 MED ORDER — ONDANSETRON HCL 4 MG PO TABS: 4.0000 mg | ORAL_TABLET | Freq: Four times a day (QID) | ORAL | Status: DC | PRN

## 2019-01-06 MED ORDER — ACETAMINOPHEN 500 MG PO TABS
1000.0000 mg | ORAL_TABLET | Freq: Once | ORAL | Status: AC
  Administered 2019-01-06: 1000 mg via ORAL
  Filled 2019-01-06: qty 2

## 2019-01-06 MED ORDER — IBUPROFEN 800 MG PO TABS
800.0000 mg | ORAL_TABLET | Freq: Once | ORAL | Status: AC
  Administered 2019-01-06: 800 mg via ORAL
  Filled 2019-01-06: qty 1

## 2019-01-06 MED ORDER — FENTANYL CITRATE (PF) 100 MCG/2ML IJ SOLN
50.0000 ug | INTRAMUSCULAR | Status: DC | PRN
  Administered 2019-01-06: 50 ug via INTRAVENOUS
  Filled 2019-01-06: qty 2

## 2019-01-06 MED ORDER — LORAZEPAM 2 MG/ML IJ SOLN
1.0000 mg | Freq: Once | INTRAMUSCULAR | Status: AC
  Administered 2019-01-06: 1 mg via INTRAVENOUS
  Filled 2019-01-06: qty 1

## 2019-01-06 MED ORDER — MORPHINE SULFATE (PF) 2 MG/ML IV SOLN
1.0000 mg | INTRAVENOUS | Status: DC | PRN
  Administered 2019-01-06: 2 mg via INTRAVENOUS
  Filled 2019-01-06: qty 1

## 2019-01-06 MED ORDER — PNEUMOCOCCAL VAC POLYVALENT 25 MCG/0.5ML IJ INJ
0.5000 mL | INJECTION | INTRAMUSCULAR | Status: AC
  Administered 2019-01-07: 0.5 mL via INTRAMUSCULAR
  Filled 2019-01-06: qty 0.5

## 2019-01-06 MED ORDER — IOHEXOL 350 MG/ML SOLN
75.0000 mL | Freq: Once | INTRAVENOUS | Status: AC | PRN
  Administered 2019-01-06: 75 mL via INTRAVENOUS

## 2019-01-06 MED ORDER — COLCHICINE 0.6 MG PO TABS
0.6000 mg | ORAL_TABLET | Freq: Once | ORAL | Status: AC
  Administered 2019-01-06: 0.6 mg via ORAL
  Filled 2019-01-06: qty 1

## 2019-01-06 MED ORDER — ACETAMINOPHEN 325 MG PO TABS: 650.0000 mg | ORAL_TABLET | Freq: Four times a day (QID) | ORAL | Status: DC | PRN

## 2019-01-06 MED ORDER — ACETAMINOPHEN 650 MG RE SUPP: 650.0000 mg | Freq: Four times a day (QID) | RECTAL | Status: DC | PRN

## 2019-01-06 NOTE — ED Notes (Signed)
Pt states she still has pain after the medication given. Dr. Quentin Cornwall is aware and will admit pt. Pt made aware of the visitation schedule for tomorrow for her husband.

## 2019-01-06 NOTE — ED Triage Notes (Signed)
Patient c/o chest pain that started yesterday.  Patient reports pain is worse when she takes a deep breath.  Patient also reports dizziness and SOB that started today.

## 2019-01-06 NOTE — Discharge Instructions (Signed)
Leave the urgent care and proceed directly to [facility] for further evaluation and treatment.   Honor Loh, MSN, APRN, FNP-C, CEN Advanced Practice Provider [facility] 01/06/2019 3:53 PM

## 2019-01-06 NOTE — ED Notes (Signed)
Susan RN, aware of bed assigned 

## 2019-01-06 NOTE — ED Notes (Signed)
ED TO INPATIENT HANDOFF REPORT  ED Nurse Name and Phone #: Dennisha Mouser [phone]  S Name/Age/Gender Kaitlyn Turner 33 y.o. female Room/Bed: ED18A/ED18A  Code Status   Code Status: Full Code  Home/SNF/Other Home Patient oriented to: self, place, time and situation Is this baseline? Yes   Triage Complete: Triage complete  Chief Complaint Chest Pain; SOB  Triage Note Pt presents to ED c/o L-sided chest pain and SOB starting last night. Pain is worse with inspiration. Pt states the last time she had chest pain she had a PE. Does not take blood thinners. Has Nexplanon implant. Pt anxious and tearful in triage.    Allergies Allergies  Allergen Reactions  . Oxycodone Anaphylaxis    Pt states reaction is vomiting, not anaphylaxis.  . Stadol [Butorphanol] Itching    Per Harden Mo, RN  . Keflex [Cephalexin] Hives  . Other     General anesthesia - reaction was hypothermia  . 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 Toradol [Ketorolac Tromethamine] Hives  . Tramadol Hives    Level of Care/Admitting Diagnosis ED Disposition    ED Disposition Condition [facility] Area: [place] [100120]  Level of Care: Telemetry [5]  Covid Evaluation: Asymptomatic Screening Protocol (No Symptoms)  Diagnosis: Chest pain [id number]  Admitting Physician: Lance Coon [id number]  Attending Physician: Lance Coon [id number]  Bed request comments: 2a  PT Class (Do Not Modify): Observation [104]  PT Acc Code (Do Not Modify): Observation [10022]       B Medical/Surgery History Past Medical History:  Diagnosis Date  . Family history of alcoholism 01/08/2015   Per [facility]  . Hypotension   . Malignant hyperthermia   . Pulmonary embolism [facility])    Past Surgical History:  Procedure Laterality Date  . DENTAL SURGERY       A IV Location/Drains/Wounds Patient Lines/Drains/Airways Status   Active Line/Drains/Airways    Name:   Placement date:   Placement time:   Site:   Days:   Peripheral IV 01/06/19 Right Antecubital   01/06/19    1623    Antecubital   less than 1          Intake/Output Last 24 hours No intake or output data in the 24 hours ending 01/06/19 2214  Labs/Imaging Results for orders placed or performed during the hospital encounter of 01/06/19 (from the past 48 hour(s))  Comprehensive metabolic panel     Status: Abnormal   Collection Time: 01/06/19  4:35 PM  Result Value Ref Range   Sodium 138 135 - 145 mmol/L   Potassium 3.9 3.5 - 5.1 mmol/L   Chloride 106 98 - 111 mmol/L   CO2 24 22 - 32 mmol/L   Glucose, Bld 99 70 - 99 mg/dL   BUN 15 6 - 20 mg/dL   Creatinine, Ser 0.76 0.44 - 1.00 mg/dL   Calcium 9.6 8.9 - 10.3 mg/dL   Total Protein 8.2 (H) 6.5 - 8.1 g/dL   Albumin 4.5 3.5 - 5.0 g/dL   AST 19 15 - 41 U/L   ALT 16 0 - 44 U/L   Alkaline Phosphatase 60 38 - 126 U/L   Total Bilirubin 1.2 0.3 - 1.2 mg/dL   GFR calc non Af Amer >60 >60 mL/min   GFR calc Af Amer >60 >60 mL/min   Anion gap 8 5 - 15    Comment: Performed at [facility], [address] [place], [place] [address]  CBC     Status: Abnormal   Collection Time: 01/06/19  4:39 PM  Result Value Ref Range   WBC 11.2 (H) 4.0 - 10.5 K/uL   RBC 4.46 3.87 - 5.11 MIL/uL   Hemoglobin 14.4 12.0 - 15.0 g/dL   HCT 41.9 36.0 - 46.0 %   MCV 93.9 80.0 - 100.0 fL   MCH 32.3 26.0 - 34.0 pg   MCHC 34.4 30.0 - 36.0 g/dL   RDW 11.9 11.5 - 15.5 %   Platelets 311 150 - 400 K/uL   nRBC 0.0 0.0 - 0.2 %    Comment: Performed at [facility], [place], [place] [address]  Troponin I (High Sensitivity)     Status: Abnormal   Collection Time: 01/06/19  4:39 PM  Result Value Ref Range   Troponin I (High Sensitivity) 26 (H) <18 ng/L    Comment: (NOTE) Elevated high sensitivity troponin I (hsTnI) values and significant  changes across serial measurements may suggest ACS but many other  chronic and acute  conditions are known to elevate hsTnI results.  Refer to the "Links" section for chest pain algorithms and additional  guidance. Performed at [facility], [address] [place], [place] [address]   Pregnancy, urine POC     Status: None   Collection Time: 01/06/19  6:15 PM  Result Value Ref Range   Preg Test, Ur NEGATIVE NEGATIVE    Comment:        THE SENSITIVITY OF THIS METHODOLOGY IS >24 mIU/mL   Troponin I (High Sensitivity)     Status: Abnormal   Collection Time: 01/06/19  6:45 PM  Result Value Ref Range   Troponin I (High Sensitivity) 22 (H) <18 ng/L    Comment: (NOTE) Elevated high sensitivity troponin I (hsTnI) values and significant  changes across serial measurements may suggest ACS but many other  chronic and acute conditions are known to elevate hsTnI results.  Refer to the "Links" section for chest pain algorithms and additional  guidance. Performed at [facility], [address] [place],  [address]    Dg Chest 2 View  Result Date: 01/06/2019 CLINICAL DATA:  Shortness of breath, left chest pain EXAM: CHEST - 2 VIEW COMPARISON:  10/19/2015 FINDINGS: Heart and mediastinal contours are within normal limits. No focal opacities or effusions. No acute bony abnormality. IMPRESSION: No active cardiopulmonary disease. Electronically Signed   By: Rolm Baptise M.D.   On: 01/06/2019 16:55   Ct Angio Chest Pe W And/or Wo Contrast  Result Date: 01/06/2019 CLINICAL DATA:  Left-sided chest pain and shortness of breath. EXAM: CT ANGIOGRAPHY CHEST WITH CONTRAST TECHNIQUE: Multidetector CT imaging of the chest was performed using the standard protocol during bolus administration of intravenous contrast. Multiplanar CT image reconstructions and MIPs were obtained to evaluate the vascular anatomy. CONTRAST:  84mL OMNIPAQUE IOHEXOL 350 MG/ML SOLN COMPARISON:  10/20/2015 FINDINGS: Cardiovascular: The heart size is normal. No substantial pericardial effusion. No  thoracic aortic aneurysm. No filling defect within the opacified pulmonary arteries to suggest the presence of an acute pulmonary embolus. Mediastinum/Nodes: No mediastinal lymphadenopathy. There is no hilar lymphadenopathy. The esophagus has normal imaging features. There is no axillary lymphadenopathy. Lungs/Pleura: 6 mm perifissural nodule in the paraspinal right lung (31/6) is unchanged, consistent with benign etiology. No suspicious pulmonary nodule or mass. No focal airspace consolidation. No pulmonary edema or pleural effusion. Upper Abdomen: Unremarkable. Musculoskeletal: No worrisome lytic or sclerotic  osseous abnormality. Review of the MIP images confirms the above findings. IMPRESSION: 1. No CT evidence for acute pulmonary embolus. 2. No other acute findings to explain the patient's history of left-sided chest pain and shortness of breath. Electronically Signed   By: Misty Stanley M.D.   On: 01/06/2019 17:34    Pending Labs Unresulted Labs (From admission, onward)    Start     Ordered   01/07/19 [id number]  Basic metabolic panel  Tomorrow morning,   STAT     01/06/19 2146   01/07/19 0500  CBC  Tomorrow morning,   STAT     01/06/19 2146   01/06/19 2147  HIV antibody (Routine Testing)  Once,   STAT     01/06/19 2146   01/06/19 1948  SARS CORONAVIRUS 2 Nasal Swab Aptima Multi Swab  (Asymptomatic/Tier 2 Patients Labs)  ONCE - STAT,   STAT    Question Answer Comment  Is this test for diagnosis or screening Screening   Symptomatic for COVID-19 as defined by CDC No   Hospitalized for COVID-19 No   Admitted to ICU for COVID-19 No   Previously tested for COVID-19 No   Resident in a congregate (group) care setting Unknown   Employed in healthcare setting Unknown   Pregnant Unknown      01/06/19 1947          Vitals/Pain Today's Vitals   01/06/19 2030 01/06/19 2100 01/06/19 2130 01/06/19 2200  BP: 133/88 127/84 119/73 133/78  Pulse: 78 77 72 80  Resp: 13 17 17 13   Temp:      TempSrc:       SpO2: 99% 99% 99% 97%  Weight:      Height:      PainSc:        Isolation Precautions No active isolations  Medications Medications  acetaminophen (TYLENOL) tablet 650 mg (has no administration in time range)    Or  acetaminophen (TYLENOL) suppository 650 mg (has no administration in time range)  ondansetron (ZOFRAN) tablet 4 mg (has no administration in time range)    Or  ondansetron (ZOFRAN) injection 4 mg (has no administration in time range)  morphine 2 MG/ML injection 1-2 mg (has no administration in time range)  LORazepam (ATIVAN) injection 1 mg (1 mg Intravenous Given 01/06/19 1740)  iohexol (OMNIPAQUE) 350 MG/ML injection 75 mL (75 mLs Intravenous Contrast Given 01/06/19 1700)  acetaminophen (TYLENOL) tablet 1,000 mg (1,000 mg Oral Given 01/06/19 1851)  ibuprofen (ADVIL) tablet 800 mg (800 mg Oral Given 01/06/19 2008)  colchicine tablet 0.6 mg (0.6 mg Oral Given 01/06/19 2020)    Mobility walks Low fall risk   Focused Assessments Cardiac Assessment Handoff:    Lab Results  Component Value Date   CKTOTAL 116 01/24/2015   TROPONINI <0.03 10/20/2015   No results found for: DDIMER Does the Patient currently have chest pain? Yes     R Recommendations: See Admitting Provider Note  Report given to:   Additional Notes:

## 2019-01-06 NOTE — ED Provider Notes (Signed)
[place] [place]   Name: Kaitlyn Turner DOB: 21-Jan-1986 MRN: [id number] CSN: [id number] PCP: Patient, No Pcp Per  Arrival date and time:  01/06/19 1532  Chief Complaint:  Chest Pain and Shortness of Breath   NOTE: Prior to seeing the patient today, I have reviewed the triage nursing documentation and vital signs. Clinical staff has updated patient's PMH/PSHx, current medication list, and drug allergies/intolerances to ensure comprehensive history available to assist in medical decision making.   History:   HPI: Kaitlyn Turner is a 33 y.o. female who presents today with complaints of pain in the LEFT side of her chest pain. Patient describes the pain as "sharp and squeezing". Pain began yesterday with no reported injury or provoking factors identified; not reproducible with movement, palpation, or deep inspiration. Patient denies any blunt force trauma to her chest wall. She denies radiation of the pain into her shoulder, neck, jaw, shoulder, and subscapular areas. Patient is not having any pain in her LEFT upper extremity. She denies associated nausea, vomiting, or diaphoresis. Patient does not have a history of gastrointestinal reflux. She denies consuming excessive amount of caffeine. She has not used any energy drinks/pills, diet pills, or illegal substances.     Patient advising that she developed dizziness, palpitations, and shortness of breath earlier today that worsened to the point of her deciding to present for medical evaluation. Patient states, "it hurts to breathe just like it did when I had my blood clot in my lungs". PMH (+) for pulmonary embolism "about 10 years ago". She reports that she was referred to hematology and underwent a hypercoagulable workup that was overall unrevealing. Patient is not on any form of oral/parenternal anticoagulation therapy. She has a Nexplanon implant in place for contraceptive purposes. She has not had any recent pain or swelling in her lower  extremities; no claudication pain. She ahs not been on any extended car/airplane trips. She denies any recent surgeries or prolonged periods of immobility. Patient denies a past medical history significant for any known cardiac problems. Patient does not have a history of any sort of cardiac arrhythmias. She has limited information regarding  her family history. Patient presents to clinic today very anxious and tearful.   Past Medical History:  Diagnosis Date   Family history of alcoholism 01/08/2015   Per [facility]   Hypotension    Malignant hyperthermia    Pulmonary embolism ([place])     Past Surgical History:  Procedure Laterality Date   DENTAL SURGERY      Family History  Family history unknown: Yes    Social History   Tobacco Use   Smoking status: Never Smoker   Smokeless tobacco: Never Used  Substance Use Topics   Alcohol use: Yes    Comment: occasionally   Drug use: No    Patient Active Problem List   Diagnosis Date Noted   MDD (major depressive disorder), recurrent episode ([place]) 08/21/2018   Suicidal ideation 08/20/2018   Family disruption due to divorce or legal separation 06/03/2015   Adjustment disorder with mixed anxiety and depressed mood 06/03/2015   Family history of alcoholism 01/08/2015    Home Medications:    Current Meds  Medication Sig   etonogestrel (NEXPLANON) 68 MG IMPL implant 1 each by Subdermal route once.    Allergies:   Oxycodone, Stadol [butorphanol], Keflex [cephalexin], Other, Pork-derived products, Toradol [ketorolac tromethamine], and Tramadol  Review of Systems (ROS): Review of Systems  Constitutional: Negative for chills, diaphoresis and fever.  Respiratory:  Positive for chest tightness and shortness of breath. Negative for cough.   Cardiovascular: Positive for chest pain and palpitations.  Gastrointestinal: Negative for nausea and vomiting.  Musculoskeletal: Negative for back pain, neck pain and neck stiffness.    Neurological: Positive for dizziness and light-headedness (pre-syncopal).  Psychiatric/Behavioral: The patient is nervous/anxious.   All other systems reviewed and are negative.    Vital Signs: Today's Vitals   01/06/19 1540 01/06/19 1542  BP:  (!) 140/97  Pulse:  100  Resp:  16  Temp:  98.5 F (36.9 C)  TempSrc:  Oral  SpO2:  100%  Weight: 170 lb (77.1 kg)   Height: 5\' 6"  (1.676 m)   PainSc: 8      Physical Exam: Physical Exam  Constitutional: She is oriented to person, place, and time and well-developed, well-nourished, and in no distress. Vital signs are normal.  HENT:  Head: Normocephalic and atraumatic.  Mouth/Throat: Oropharynx is clear and moist and mucous membranes are normal.  Eyes: Pupils are equal, round, and reactive to light. EOM are normal.  Neck: Normal range of motion. Neck supple. No tracheal deviation present.  Cardiovascular: Normal rate, regular rhythm, normal heart sounds and intact distal pulses. Exam reveals no gallop and no friction rub.  No murmur heard. Pulmonary/Chest: Effort normal and breath sounds normal. No accessory muscle usage. Tachypnea noted. No respiratory distress. She has no decreased breath sounds. She has no wheezes. She has no rhonchi. She has no rales. She exhibits no tenderness.  Abdominal: Soft. Normal appearance and bowel sounds are normal. There is no hepatosplenomegaly. There is no abdominal tenderness.  Neurological: She is alert and oriented to person, place, and time. Gait normal. GCS score is 15.  Skin: Skin is warm and dry. No rash noted.  Psychiatric: Memory, affect and judgment normal. Her mood appears anxious (tearful).  Nursing note and vitals reviewed.   Urgent Care Treatments / Results:   LABS: PLEASE NOTE: all labs that were ordered this encounter are listed, however only abnormal results are displayed. Labs Reviewed - No data to display  URGENT CARE ECG REPORT Date: 01/06/2019 Time ECG obtained: 1546 PM Rate:  86 bpm Rhythm: normal sinus rhythm Axis (leads I and aVF): normal Intervals: normal ST segment and T wave changes: No evidence of ST segment elevation or depression Comparison: Similar to previous tracing obtained on 10/19/2015  RADIOLOGY: -None  PROCEDURES: Procedures  MEDICATIONS RECEIVED THIS VISIT: Medications - No data to display  PERTINENT CLINICAL COURSE NOTES/UPDATES:   Initial Impression / Assessment and Plan / Urgent Care Course:  Pertinent labs & imaging results that were available during my care of the patient were personally reviewed by me and considered in my medical decision making (see lab/imaging section of note for values and interpretations).  Kaitlyn Turner is a 33 y.o. female who presents to [facility] today with complaints of Chest Pain and Shortness of Breath   Patient presents to clinic today very anxious; she is tearful. She does not appear to be in any acute distress with regards to her ability to breathe. Presenting symptoms (see HPI) and exam as documented above. Given her history of an unprovoked pulmonary embolism in the past, her presenting complaints of chest pain and shortness of breath are concerning. Patient reporting that her symptoms feel similar to when she had her clot in the past. EKG normal at this time, however I discussed with patient that this was only a part of the workup that she  would need. Discussed need for labs and advanced imaging to further assess her current symptoms and rule out a recurrent pulmonary embolism. Patient was advised that her care needs were beyond the services able to be provided in the urgent care setting (needs higher level of care). Discussed that she will need to be seen in the emergency department; patient in agreement. Options of facilities presented to her and she elects to present to [facility] for ongoing care and management. Patient's clinical status concerning. Discussed need for EMS transport, however she  refused citing that she had a ride in the parking lot. Risks discussed, however patient remained adamant about leaving our facility via POV.   Report called to [facility] staff; spoke with Caryl Pina, RN. Nurse was advised of patient's presenting complaints, assessment, and discharge plan of care to their facility for further workup for chest pain. Nurse advised that patient would be presenting via POV despite recommendations for EMS transport. [facility] staff was encouraged to return a call to [facility] with any questions or concerning related to the care that Wetzel Bjornstad received today.   Final Clinical Impressions / Urgent Care Diagnoses:   Final diagnoses:  Chest pain, unspecified type  Palpitations  SOB (shortness of breath)  History of pulmonary embolism    New Prescriptions:   Controlled Substance Registry consulted? Not Applicable  No orders of the defined types were placed in this encounter.   Recommended Follow up Care:  Patient encouraged to follow up with the following provider within the specified time frame, or sooner as dictated by the severity of her symptoms. As always, she was instructed that for any urgent/emergent care needs, she should seek care either here or in the emergency department for more immediate evaluation.  Follow-up Information    Go to  [facility].   Specialty: Emergency Medicine Contact information: [place] [id number] [place] [phone]        NOTE: This note was prepared using Dragon dictation software along with smaller phrase technology. Despite my best ability to proofread, there is the potential that transcriptional errors may still occur from this process, and are completely unintentional.     Karen Kitchens, NP 01/06/19 2103

## 2019-01-06 NOTE — ED Provider Notes (Signed)
[facility] Emergency Department Provider Note    First MD Initiated Contact with Patient 01/06/19 1627     (approximate)  I have reviewed the triage vital signs and the nursing notes.   HISTORY  Chief Complaint Chest Pain    HPI Kaitlyn Turner is a 33 y.o. female with a history of PE not currently on any anticoagulation but does have Nexplanon in place presents the ER with less than 24 hours of left-sided chest wall pain and pressure with shortness of breath and pleuritic discomfort.  Patient arrives very anxious and tearful.  Denies any nausea or vomiting.  No fevers.  No abdominal pain.  Denies any breast pain or discharge.  No recent trauma.    Past Medical History:  Diagnosis Date  . Family history of alcoholism 01/08/2015   Per Centric[facility]  . Hypotension   . Malignant hyperthermia   . Pulmonary embolism ([place])    Family History  Family history unknown: Yes   Past Surgical History:  Procedure Laterality Date  . DENTAL SURGERY     Patient Active Problem List   Diagnosis Date Noted  . MDD (major depressive disorder), recurrent episode ([place]) 08/21/2018  . Suicidal ideation 08/20/2018  . Family disruption due to divorce or legal separation 06/03/2015  . Adjustment disorder with mixed anxiety and depressed mood 06/03/2015  . Family history of alcoholism 01/08/2015      Prior to Admission medications   Medication Sig Start Date End Date Taking? Authorizing Provider  etonogestrel (NEXPLANON) 68 MG IMPL implant 1 each by Subdermal route once. 07/21/16 07/22/19  Jerene Dilling, PA  ibuprofen (ADVIL) 600 MG tablet Take 1 tablet (600 mg total) by mouth every 8 (eight) hours as needed. 11/07/18   Johnn Hai, PA-C  traZODone (DESYREL) 50 MG tablet Take 1 tablet (50 mg total) by mouth at bedtime and may repeat dose one time if needed. For sleep 08/23/18 01/06/19  Connye Burkitt, NP    Allergies Oxycodone, Stadol [butorphanol], Keflex  [cephalexin], Other, Pork-derived products, Toradol [ketorolac tromethamine], and Tramadol    Social History Social History   Tobacco Use  . Smoking status: Never Smoker  . Smokeless tobacco: Never Used  Substance Use Topics  . Alcohol use: Yes    Comment: occasionally  . Drug use: No    Review of Systems Patient denies headaches, rhinorrhea, blurry vision, numbness, shortness of breath, chest pain, edema, cough, abdominal pain, nausea, vomiting, diarrhea, dysuria, fevers, rashes or hallucinations unless otherwise stated above in HPI. ____________________________________________   PHYSICAL EXAM:  VITAL SIGNS: Vitals:   01/06/19 1830 01/06/19 1900  BP: 130/81 126/90  Pulse: (!) 51 94  Resp: 16 (!) 22  Temp:    SpO2: 98% 99%    Constitutional: Alert and oriented.  Eyes: Conjunctivae are normal.  Head: Atraumatic. Nose: No congestion/rhinnorhea. Mouth/Throat: Mucous membranes are moist.   Neck: No stridor. Painless ROM.  Cardiovascular: Normal rate, regular rhythm. Grossly normal heart sounds.  Good peripheral circulation. Respiratory: Normal respiratory effort.  No retractions. Lungs CTAB. Gastrointestinal: Soft and nontender. No distention. No abdominal bruits. No CVA tenderness. Genitourinary:  Musculoskeletal: No lower extremity tenderness nor edema.  No joint effusions. Neurologic:  Normal speech and language. No gross focal neurologic deficits are appreciated. No facial droop Skin:  Skin is warm, dry and intact. No rash noted. Psychiatric: Mood and affect are anxious. Speech and behavior are normal.  ____________________________________________   LABS (all labs ordered are listed, but  only abnormal results are displayed)  Results for orders placed or performed during the hospital encounter of 01/06/19 (from the past 24 hour(s))  Comprehensive metabolic panel     Status: Abnormal   Collection Time: 01/06/19  4:35 PM  Result Value Ref Range   Sodium 138 135 -  145 mmol/L   Potassium 3.9 3.5 - 5.1 mmol/L   Chloride 106 98 - 111 mmol/L   CO2 24 22 - 32 mmol/L   Glucose, Bld 99 70 - 99 mg/dL   BUN 15 6 - 20 mg/dL   Creatinine, Ser 0.76 0.44 - 1.00 mg/dL   Calcium 9.6 8.9 - 10.3 mg/dL   Total Protein 8.2 (H) 6.5 - 8.1 g/dL   Albumin 4.5 3.5 - 5.0 g/dL   AST 19 15 - 41 U/L   ALT 16 0 - 44 U/L   Alkaline Phosphatase 60 38 - 126 U/L   Total Bilirubin 1.2 0.3 - 1.2 mg/dL   GFR calc non Af Amer >60 >60 mL/min   GFR calc Af Amer >60 >60 mL/min   Anion gap 8 5 - 15  CBC     Status: Abnormal   Collection Time: 01/06/19  4:39 PM  Result Value Ref Range   WBC 11.2 (H) 4.0 - 10.5 K/uL   RBC 4.46 3.87 - 5.11 MIL/uL   Hemoglobin 14.4 12.0 - 15.0 g/dL   HCT 41.9 36.0 - 46.0 %   MCV 93.9 80.0 - 100.0 fL   MCH 32.3 26.0 - 34.0 pg   MCHC 34.4 30.0 - 36.0 g/dL   RDW 11.9 11.5 - 15.5 %   Platelets 311 150 - 400 K/uL   nRBC 0.0 0.0 - 0.2 %  Troponin I (High Sensitivity)     Status: Abnormal   Collection Time: 01/06/19  4:39 PM  Result Value Ref Range   Troponin I (High Sensitivity) 26 (H) <18 ng/L  Pregnancy, urine POC     Status: None   Collection Time: 01/06/19  6:15 PM  Result Value Ref Range   Preg Test, Ur NEGATIVE NEGATIVE  Troponin I (High Sensitivity)     Status: Abnormal   Collection Time: 01/06/19  6:45 PM  Result Value Ref Range   Troponin I (High Sensitivity) 22 (H) <18 ng/L   ____________________________________________  EKG My review and personal interpretation at Time: 16:20   Indication: chest pain  Rate: 100  Rhythm: sinus Axis: normal Other: normal intervals, no stemi ____________________________________________  RADIOLOGY  I personally reviewed all radiographic images ordered to evaluate for the above acute complaints and reviewed radiology reports and findings.  These findings were personally discussed with the patient.  Please see medical record for radiology report.  ____________________________________________    PROCEDURES  Procedure(s) performed:  Procedures    Critical Care performed: no ____________________________________________   INITIAL IMPRESSION / ASSESSMENT AND PLAN / ED COURSE  Pertinent labs & imaging results that were available during my care of the patient were reviewed by me and considered in my medical decision making (see chart for details).   DDX: pleurisy, pe, pna, ptx, msk strain, dysrhythmia  Kaitlyn Turner is a 33 y.o. who presents to the ED with symptoms as described above.  Patient initially very anxious appearing does have mildly elevated white count.  EKG shows some nonspecific changes no STEMI.  Some of her symptoms are consistent with pericarditis but her troponin is elevated concerning for a component of myocardial involvement.  Given her history of PE CTA  ordered to exclude PE shows none.  I see no evidence of pneumonia.  She is not febrile but she is still having some pain failing multiple medications here in the ER.  At this point is him unable to get her pain controlled will discuss with hospitalist for admission further medical management.     The patient was evaluated in Emergency Department today for the symptoms described in the history of present illness. He/she was evaluated in the context of the global COVID-19 pandemic, which necessitated consideration that the patient might be at risk for infection with the SARS-CoV-2 virus that causes COVID-19. Institutional protocols and algorithms that pertain to the evaluation of patients at risk for COVID-19 are in a state of rapid change based on information released by regulatory bodies including the CDC and federal and state organizations. These policies and algorithms were followed during the patient's care in the ED.  As part of my medical decision making, I reviewed the following data within the [facility] notes reviewed and incorporated, Labs reviewed, notes from prior ED visits and [place]  Controlled Substance Database   ____________________________________________   FINAL CLINICAL IMPRESSION(S) / ED DIAGNOSES  Final diagnoses:  Chest pain, unspecified type      NEW MEDICATIONS STARTED DURING THIS VISIT:  New Prescriptions   No medications on file     Note:  This document was prepared using Dragon voice recognition software and may include unintentional dictation errors.    Merlyn Lot, MD 01/06/19 2010

## 2019-01-06 NOTE — H&P (Signed)
[facility] [place] NAME: Kaitlyn Turner    MR#:  [id number]  DATE OF BIRTH:  02-10-1986  DATE OF ADMISSION:  01/06/2019  PRIMARY CARE PHYSICIAN: Patient, No Pcp Per   REQUESTING/REFERRING PHYSICIAN: Quentin Cornwall, MD  CHIEF COMPLAINT:   Chief Complaint  Patient presents with  . Chest Pain    HISTORY OF PRESENT ILLNESS:  Kaitlyn Turner  is a 33 y.o. female who presents with chief complaint as above.  Patient presents the ED with a complaint of chest discomfort.  She states that she has left-sided chest pain, nonradiating.  It is associated with some intermittent discomfort with deep breathing, though not outright shortness of breath.  Patient states she has a history of PE in the past, and came in to be seen because she was concerned for may be having another pulmonary embolism.  On evaluation in the ED she does not have pulmonary embolism on CTA chest.  However, her high-sensitivity troponin is mildly elevated.  Her chest pain is somewhat persistent, though she denies any positional component.  Hospitalist were called for admission and further evaluation  PAST MEDICAL HISTORY:   Past Medical History:  Diagnosis Date  . Family history of alcoholism 01/08/2015   Per Centricity  . Hypotension   . Malignant hyperthermia   . Pulmonary embolism ([place])      PAST SURGICAL HISTORY:   Past Surgical History:  Procedure Laterality Date  . DENTAL SURGERY       SOCIAL HISTORY:   Social History   Tobacco Use  . Smoking status: Never Smoker  . Smokeless tobacco: Never Used  Substance Use Topics  . Alcohol use: Yes    Comment: occasionally     FAMILY HISTORY:   Family History  Family history unknown: Yes     DRUG ALLERGIES:   Allergies  Allergen Reactions  . Oxycodone Anaphylaxis  . Stadol [Butorphanol] Itching    Per Harden Mo, RN  . Keflex [Cephalexin] Hives  . Other     General anesthesia - reaction was  hypothermia  . 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 Toradol [Ketorolac Tromethamine] Hives  . Tramadol Hives    MEDICATIONS AT HOME:   Prior to Admission medications   Medication Sig Start Date End Date Taking? Authorizing Provider  etonogestrel (NEXPLANON) 68 MG IMPL implant 1 each by Subdermal route once. 07/21/16 07/22/19  Jerene Dilling, PA  ibuprofen (ADVIL) 600 MG tablet Take 1 tablet (600 mg total) by mouth every 8 (eight) hours as needed. 11/07/18   Johnn Hai, PA-C  traZODone (DESYREL) 50 MG tablet Take 1 tablet (50 mg total) by mouth at bedtime and may repeat dose one time if needed. For sleep 08/23/18 01/06/19  Connye Burkitt, NP    REVIEW OF SYSTEMS:  Review of Systems  Constitutional: Negative for chills, fever, malaise/fatigue and weight loss.  HENT: Negative for ear pain, hearing loss and tinnitus.   Eyes: Negative for blurred vision, double vision, pain and redness.  Respiratory: Negative for cough, hemoptysis and shortness of breath.   Cardiovascular: Positive for chest pain. Negative for palpitations, orthopnea and leg swelling.  Gastrointestinal: Negative for abdominal pain, constipation, diarrhea, nausea and vomiting.  Genitourinary: Negative for dysuria, frequency and hematuria.  Musculoskeletal: Negative for back pain, joint pain and neck pain.  Skin:       No acne, rash, or lesions  Neurological: Negative for  dizziness, tremors, focal weakness and weakness.  Endo/Heme/Allergies: Negative for polydipsia. Does not bruise/bleed easily.  Psychiatric/Behavioral: Negative for depression. The patient is not nervous/anxious and does not have insomnia.      VITAL SIGNS:   Vitals:   01/06/19 1930 01/06/19 2000 01/06/19 2030 01/06/19 2100  BP: 135/88 126/80 133/88 127/84  Pulse: 86 76 78 77  Resp: 18 (!) 21 13 17   Temp:      TempSrc:      SpO2: 100% 100% 99% 99%  Weight:      Height:       Wt Readings from Last 3  Encounters:  01/06/19 79.4 kg  01/06/19 77.1 kg  11/23/18 80.3 kg    PHYSICAL EXAMINATION:  Physical Exam  Vitals reviewed. Constitutional: She is oriented to person, place, and time. She appears well-developed and well-nourished. No distress.  HENT:  Head: Normocephalic and atraumatic.  Mouth/Throat: Oropharynx is clear and moist.  Eyes: Pupils are equal, round, and reactive to light. Conjunctivae and EOM are normal. No scleral icterus.  Neck: Normal range of motion. Neck supple. No JVD present. No thyromegaly present.  Cardiovascular: Normal rate, regular rhythm and intact distal pulses. Exam reveals no gallop and no friction rub.  No murmur heard. Respiratory: Effort normal and breath sounds normal. No respiratory distress. She has no wheezes. She has no rales.  GI: Soft. Bowel sounds are normal. She exhibits no distension. There is no abdominal tenderness.  Musculoskeletal: Normal range of motion.        General: No edema.     Comments: No arthritis, no gout  Lymphadenopathy:    She has no cervical adenopathy.  Neurological: She is alert and oriented to person, place, and time. No cranial nerve deficit.  No dysarthria, no aphasia  Skin: Skin is warm and dry. No rash noted. No erythema.  Psychiatric: She has a normal mood and affect. Her behavior is normal. Judgment and thought content normal.    LABORATORY PANEL:   CBC Recent Labs  Lab 01/06/19 1639  WBC 11.2*  HGB 14.4  HCT 41.9  PLT 311   ------------------------------------------------------------------------------------------------------------------  Chemistries  Recent Labs  Lab 01/06/19 1635  NA 138  K 3.9  CL 106  CO2 24  GLUCOSE 99  BUN 15  CREATININE 0.76  CALCIUM 9.6  AST 19  ALT 16  ALKPHOS 60  BILITOT 1.2   ------------------------------------------------------------------------------------------------------------------  Cardiac Enzymes No results for input(s): TROPONINI in the last 168  hours. ------------------------------------------------------------------------------------------------------------------  RADIOLOGY:  Dg Chest 2 View  Result Date: 01/06/2019 CLINICAL DATA:  Shortness of breath, left chest pain EXAM: CHEST - 2 VIEW COMPARISON:  10/19/2015 FINDINGS: Heart and mediastinal contours are within normal limits. No focal opacities or effusions. No acute bony abnormality. IMPRESSION: No active cardiopulmonary disease. Electronically Signed   By: Rolm Baptise M.D.   On: 01/06/2019 16:55   Ct Angio Chest Pe W And/or Wo Contrast  Result Date: 01/06/2019 CLINICAL DATA:  Left-sided chest pain and shortness of breath. EXAM: CT ANGIOGRAPHY CHEST WITH CONTRAST TECHNIQUE: Multidetector CT imaging of the chest was performed using the standard protocol during bolus administration of intravenous contrast. Multiplanar CT image reconstructions and MIPs were obtained to evaluate the vascular anatomy. CONTRAST:  76mL OMNIPAQUE IOHEXOL 350 MG/ML SOLN COMPARISON:  10/20/2015 FINDINGS: Cardiovascular: The heart size is normal. No substantial pericardial effusion. No thoracic aortic aneurysm. No filling defect within the opacified pulmonary arteries to suggest the presence of an acute pulmonary embolus. Mediastinum/Nodes: No mediastinal  lymphadenopathy. There is no hilar lymphadenopathy. The esophagus has normal imaging features. There is no axillary lymphadenopathy. Lungs/Pleura: 6 mm perifissural nodule in the paraspinal right lung (31/6) is unchanged, consistent with benign etiology. No suspicious pulmonary nodule or mass. No focal airspace consolidation. No pulmonary edema or pleural effusion. Upper Abdomen: Unremarkable. Musculoskeletal: No worrisome lytic or sclerotic osseous abnormality. Review of the MIP images confirms the above findings. IMPRESSION: 1. No CT evidence for acute pulmonary embolus. 2. No other acute findings to explain the patient's history of left-sided chest pain and  shortness of breath. Electronically Signed   By: Misty Stanley M.D.   On: 01/06/2019 17:34    EKG:   Orders placed or performed during the hospital encounter of 01/06/19  . ED EKG  . ED EKG  . EKG 12-Lead  . EKG 12-Lead  . EKG 12-Lead  . EKG 12-Lead    IMPRESSION AND PLAN:  Principal Problem:   Chest pain -unclear absolute etiology for her chest pain.  Perhaps pericarditis versus myocarditis.  We will trend her troponin tonight, and get an echocardiogram and cardiology consult   Chart review performed and case discussed with ED provider. Labs, imaging and/or ECG reviewed by provider and discussed with patient/family. Management plans discussed with the patient and/or family.  COVID-19 status: Pending  DVT PROPHYLAXIS: Mechanical only - sensitivity to porcine products  GI PROPHYLAXIS:  None  ADMISSION STATUS: Observation  CODE STATUS: Full Code Status History    Date Active Date Inactive Code Status Order ID Comments User Context   08/21/2018 1922 08/23/2018 1649 Full Code [id number]  Derrill [place], NP Inpatient   Advance Care Planning Activity      TOTAL TIME TAKING CARE OF THIS PATIENT: 40 minutes.   This patient was evaluated in the context of the global COVID-19 pandemic, which necessitated consideration that the patient might be at risk for infection with the SARS-CoV-2 virus that causes COVID-19. Institutional protocols and algorithms that pertain to the evaluation of patients at risk for COVID-19 are in a state of rapid change based on information released by regulatory bodies including the CDC and federal and state organizations. These policies and algorithms were followed to the best of this provider's knowledge to date during the patient's care at this facility.  Ethlyn Daniels 01/06/2019, 9:40 PM  [facility]  Office  [phone]  CC: Primary care physician; Patient, No Pcp Per  Note:  This document was prepared using Dragon voice recognition  software and may include unintentional dictation errors.

## 2019-01-06 NOTE — ED Notes (Signed)
Pt in ct 

## 2019-01-06 NOTE — ED Triage Notes (Signed)
Pt presents to ED c/o L-sided chest pain and SOB starting last night. Pain is worse with inspiration. Pt states the last time she had chest pain she had a PE. Does not take blood thinners. Has Nexplanon implant. Pt anxious and tearful in triage.

## 2019-01-07 ENCOUNTER — Observation Stay (HOSPITAL_BASED_OUTPATIENT_CLINIC_OR_DEPARTMENT_OTHER)
Admit: 2019-01-07 | Discharge: 2019-01-07 | Disposition: A | Discharge status: Home / Self Care | Payer: Medicaid Other | Attending: Internal Medicine | Admitting: Internal Medicine

## 2019-01-07 DIAGNOSIS — F329 Major depressive disorder, single episode, unspecified: Secondary | ICD-10-CM

## 2019-01-07 DIAGNOSIS — Z86711 Personal history of pulmonary embolism: Secondary | ICD-10-CM

## 2019-01-07 DIAGNOSIS — R0789 Other chest pain: Secondary | ICD-10-CM

## 2019-01-07 DIAGNOSIS — R079 Chest pain, unspecified: Secondary | ICD-10-CM | POA: Diagnosis not present

## 2019-01-07 LAB — SARS CORONAVIRUS 2 (TAT 6-24 HRS): SARS Coronavirus 2: NEGATIVE

## 2019-01-07 LAB — CBC
HCT: 38.9 % (ref 36.0–46.0)
Hemoglobin: 13.2 g/dL (ref 12.0–15.0)
MCH: 32.3 pg (ref 26.0–34.0)
MCHC: 33.9 g/dL (ref 30.0–36.0)
MCV: 95.1 fL (ref 80.0–100.0)
Platelets: 248 10*3/uL (ref 150–400)
RBC: 4.09 MIL/uL (ref 3.87–5.11)
RDW: 11.9 % (ref 11.5–15.5)
WBC: 6.9 10*3/uL (ref 4.0–10.5)
nRBC: 0 % (ref 0.0–0.2)

## 2019-01-07 LAB — BASIC METABOLIC PANEL
Anion gap: 8 (ref 5–15)
BUN: 18 mg/dL (ref 6–20)
CO2: 25 mmol/L (ref 22–32)
Calcium: 8.9 mg/dL (ref 8.9–10.3)
Chloride: 105 mmol/L (ref 98–111)
Creatinine, Ser: 0.73 mg/dL (ref 0.44–1.00)
GFR calc Af Amer: >60 mL/min (ref >60)
GFR calc non Af Amer: >60 mL/min (ref >60)
Glucose, Bld: 94 mg/dL (ref 70–99)
Potassium: 3.7 mmol/L (ref 3.5–5.1)
Sodium: 138 mmol/L (ref 135–145)

## 2019-01-07 LAB — ECHOCARDIOGRAM COMPLETE
Height: 66 in
Weight: 2864 oz

## 2019-01-07 MED ORDER — IBUPROFEN 400 MG PO TABS
600.0000 mg | ORAL_TABLET | Freq: Four times a day (QID) | ORAL | Status: DC | PRN
  Administered 2019-01-07: 600 mg via ORAL
  Filled 2019-01-07 (×2): qty 2

## 2019-01-07 MED ORDER — COLCHICINE 0.6 MG PO TABS: 0.6000 mg | ORAL_TABLET | Freq: Every day | ORAL | 0 refills | Status: DC | PRN

## 2019-01-07 MED ORDER — MORPHINE SULFATE (PF) 2 MG/ML IV SOLN: 2.0000 mg | INTRAVENOUS | Status: DC | PRN

## 2019-01-07 MED ORDER — COLCHICINE 0.6 MG PO TABS
0.6000 mg | ORAL_TABLET | Freq: Two times a day (BID) | ORAL | Status: DC
  Administered 2019-01-07: 0.6 mg via ORAL
  Filled 2019-01-07: qty 1

## 2019-01-07 NOTE — Progress Notes (Signed)
*  PRELIMINARY RESULTS* Echocardiogram 2D Echocardiogram has been performed.  [place] 01/07/2019, 9:09 AM

## 2019-01-07 NOTE — Plan of Care (Signed)
  Problem: Education: Goal: Knowledge of General Education information will improve Description: Including pain rating scale, medication(s)/side effects and non-pharmacologic comfort measures Outcome: Completed/Met   Problem: Health Behavior/Discharge Planning: Goal: Ability to manage health-related needs will improve Outcome: Completed/Met   Problem: Clinical Measurements: Goal: Ability to maintain clinical measurements within normal limits will improve Outcome: Completed/Met Goal: Will remain free from infection Outcome: Completed/Met Goal: Diagnostic test results will improve Outcome: Completed/Met Goal: Respiratory complications will improve Outcome: Completed/Met Goal: Cardiovascular complication will be avoided Outcome: Completed/Met   Problem: Coping: Goal: Level of anxiety will decrease Outcome: Completed/Met   Problem: Elimination: Goal: Will not experience complications related to bowel motility Outcome: Completed/Met Goal: Will not experience complications related to urinary retention Outcome: Completed/Met   Problem: Pain Managment: Goal: General experience of comfort will improve Outcome: Completed/Met
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
## 2019-01-07 NOTE — Discharge Instructions (Signed)
1.  Please take over-the-counter Aleve for musculoskeletal and/pleuritic chest pain 2.  To counteract the symptoms of reflux with Aleve, can take over-the-counter omeprazole.

## 2019-01-07 NOTE — Progress Notes (Signed)
Patient admitted to 2 A with the diagnosis of chest pain. Alert and oriented x 4. Patient oriented to her room, ascom/call bell and staff .Full assessment to epic completed. Patient received Morphine 2 mg for c/o chest pain of 8/10 on a pain scale. Will continue to monitor.

## 2019-01-07 NOTE — Progress Notes (Signed)
Pt discharged to home via wc.  Instructions  given to pt.  Questions answered.  No distress.  
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
## 2019-01-07 NOTE — Progress Notes (Addendum)
[facility] [place] NAME: Kaitlyn Turner    MR#:  [id number]  DATE OF BIRTH:  12-Dec-1985  SUBJECTIVE:  CHIEF COMPLAINT:   Chief Complaint  Patient presents with  . Chest Pain   -Admitted with chest pain, still complains of left-sided chest heaviness, nonradiating. -No tenderness but hurts when takes deep breaths.  REVIEW OF SYSTEMS:  Review of Systems  Constitutional: Negative for chills, fever and malaise/fatigue.  HENT: Negative for congestion, ear discharge, hearing loss and nosebleeds.   Eyes: Negative for blurred vision and double vision.  Respiratory: Positive for shortness of breath. Negative for cough and wheezing.   Cardiovascular: Positive for chest pain. Negative for palpitations and leg swelling.  Gastrointestinal: Negative for abdominal pain, constipation, diarrhea, nausea and vomiting.  Genitourinary: Negative for dysuria and urgency.  Musculoskeletal: Negative for myalgias.  Neurological: Negative for dizziness, focal weakness, seizures, weakness and headaches.  Psychiatric/Behavioral: Negative for depression.    DRUG ALLERGIES:   Allergies  Allergen Reactions  . Oxycodone Anaphylaxis    Pt states reaction is vomiting, not anaphylaxis.  . Stadol [Butorphanol] Itching    Per Harden Mo, RN  . Keflex [Cephalexin] Hives  . Other     General anesthesia - reaction was hypothermia  . 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 Toradol [Ketorolac Tromethamine] Hives  . Tramadol Hives    VITALS:  Blood pressure 126/79, pulse 67, temperature 98.6 F (37 C), temperature source Oral, resp. rate 18, height 5\' 6"  (1.676 m), weight 81.2 kg, last menstrual period 12/23/2018, SpO2 100 %.  PHYSICAL EXAMINATION:  Physical Exam   GENERAL:  33 y.o.-year-old patient lying in the bed with no acute distress.  EYES: Pupils equal, round, reactive to light and accommodation. No scleral icterus.  Extraocular muscles intact.  HEENT: Head atraumatic, normocephalic. Oropharynx and nasopharynx clear.  NECK:  Supple, no jugular venous distention. No thyroid enlargement, no tenderness.  LUNGS: Normal breath sounds bilaterally, no wheezing, rales,rhonchi or crepitation. No use of accessory muscles of respiration.  CARDIOVASCULAR: S1, S2 normal.  No chest wall tenderness.  No murmurs, rubs, or gallops.  ABDOMEN: Soft, nontender, nondistended. Bowel sounds present. No organomegaly or mass.  EXTREMITIES: No pedal edema, cyanosis, or clubbing.  NEUROLOGIC: Cranial nerves II through XII are intact. Muscle strength 5/5 in all extremities. Sensation intact. Gait not checked.  PSYCHIATRIC: The patient is alert and oriented x 3.  SKIN: No obvious rash, lesion, or ulcer.    LABORATORY PANEL:   CBC Recent Labs  Lab 01/07/19 0536  WBC 6.9  HGB 13.2  HCT 38.9  PLT 248   ------------------------------------------------------------------------------------------------------------------  Chemistries  Recent Labs  Lab 01/06/19 1635 01/07/19 0536  NA 138 138  K 3.9 3.7  CL 106 105  CO2 24 25  GLUCOSE 99 94  BUN 15 18  CREATININE 0.76 0.73  CALCIUM 9.6 8.9  AST 19  --   ALT 16  --   ALKPHOS 60  --   BILITOT 1.2  --    ------------------------------------------------------------------------------------------------------------------  Cardiac Enzymes No results for input(s): TROPONINI in the last 168 hours. ------------------------------------------------------------------------------------------------------------------  RADIOLOGY:  Dg Chest 2 View  Result Date: 01/06/2019 CLINICAL DATA:  Shortness of breath, left chest pain EXAM: CHEST - 2 VIEW COMPARISON:  10/19/2015 FINDINGS: Heart and mediastinal contours are within normal limits. No focal opacities or effusions. No acute bony abnormality. IMPRESSION: No active cardiopulmonary disease. Electronically Signed  By: Rolm Baptise M.D.    On: 01/06/2019 16:55   Ct Angio Chest Pe W And/or Wo Contrast  Result Date: 01/06/2019 CLINICAL DATA:  Left-sided chest pain and shortness of breath. EXAM: CT ANGIOGRAPHY CHEST WITH CONTRAST TECHNIQUE: Multidetector CT imaging of the chest was performed using the standard protocol during bolus administration of intravenous contrast. Multiplanar CT image reconstructions and MIPs were obtained to evaluate the vascular anatomy. CONTRAST:  17mL OMNIPAQUE IOHEXOL 350 MG/ML SOLN COMPARISON:  10/20/2015 FINDINGS: Cardiovascular: The heart size is normal. No substantial pericardial effusion. No thoracic aortic aneurysm. No filling defect within the opacified pulmonary arteries to suggest the presence of an acute pulmonary embolus. Mediastinum/Nodes: No mediastinal lymphadenopathy. There is no hilar lymphadenopathy. The esophagus has normal imaging features. There is no axillary lymphadenopathy. Lungs/Pleura: 6 mm perifissural nodule in the paraspinal right lung (31/6) is unchanged, consistent with benign etiology. No suspicious pulmonary nodule or mass. No focal airspace consolidation. No pulmonary edema or pleural effusion. Upper Abdomen: Unremarkable. Musculoskeletal: No worrisome lytic or sclerotic osseous abnormality. Review of the MIP images confirms the above findings. IMPRESSION: 1. No CT evidence for acute pulmonary embolus. 2. No other acute findings to explain the patient's history of left-sided chest pain and shortness of breath. Electronically Signed   By: Misty Stanley M.D.   On: 01/06/2019 17:34    EKG:   Orders placed or performed during the hospital encounter of 01/06/19  . ED EKG  . ED EKG  . EKG 12-Lead  . EKG 12-Lead  . EKG 12-Lead  . EKG 12-Lead    ASSESSMENT AND PLAN:   33 year old female with past medical history significant for history of PE currently not on anticoagulation presents to hospital secondary to sudden onset of chest discomfort.  1.  Left-sided chest pain-associated  with shortness of breath.  Not hypoxic or dyspneic -CT angiogram in the ED negative for pulmonary embolism -High-sensitivity troponins are negative for any acute MI -Cardiology has been consulted.  Echocardiogram done. -Concern for either pericarditis versus musculoskeletal chest pain -Started on colchicine, ibuprofen as needed -Continue to monitor  2.  DVT prophylaxis- Encourage ambulation     All the records are reviewed and case discussed with Care Management/Social Workerr. Management plans discussed with the patient, family and they are in agreement.  CODE STATUS: Full Code  TOTAL TIME TAKING CARE OF THIS PATIENT: 38 minutes.   POSSIBLE D/C IN 1 DAY, DEPENDING ON CLINICAL CONDITION.   Gladstone Lighter M.D on 01/07/2019 at 8:28 AM  Between 7am to 6pm - Pager - [phone]  After 6pm go to [email or web address] - password [email or web address] [facility]  Office  [phone]  CC: Primary care physician; Patient, No Pcp Per

## 2019-01-08 LAB — POCT I-STAT CREATININE: Creatinine, Ser: 0.8 mg/dL (ref 0.44–1.00)

## 2019-01-09 LAB — HIV ANTIBODY (ROUTINE TESTING W REFLEX): HIV Screen 4th Generation wRfx: NONREACTIVE

## 2019-01-17 ENCOUNTER — Other Ambulatory Visit: Payer: Self-pay

## 2019-01-17 ENCOUNTER — Ambulatory Visit: Payer: Medicaid Other | Admitting: Physician Assistant

## 2019-01-17 DIAGNOSIS — B9689 Other specified bacterial agents as the cause of diseases classified elsewhere: Secondary | ICD-10-CM | POA: Diagnosis not present

## 2019-01-17 DIAGNOSIS — N760 Acute vaginitis: Secondary | ICD-10-CM

## 2019-01-17 DIAGNOSIS — Z113 Encounter for screening for infections with a predominantly sexual mode of transmission: Secondary | ICD-10-CM

## 2019-01-17 LAB — WET PREP FOR TRICH, YEAST, CLUE
Trichomonas Exam: NEGATIVE
Yeast Exam: NEGATIVE

## 2019-01-17 MED ORDER — METRONIDAZOLE 500 MG PO TABS: 500.0000 mg | ORAL_TABLET | Freq: Two times a day (BID) | ORAL | 0 refills | Status: DC

## 2019-01-18 ENCOUNTER — Encounter: Payer: Self-pay | Admitting: Physician Assistant

## 2019-01-18 NOTE — Progress Notes (Signed)
STI clinic/screening visit  Subjective:  Kaitlyn Turner is a 33 y.o. female being seen today for an STI screening visit. The patient reports they do have symptoms.  Patient has the following medical conditions:   Patient Active Problem List   Diagnosis Date Noted  . Chest pain 01/06/2019  . MDD (major depressive disorder), recurrent episode ([place]) 08/21/2018  . Suicidal ideation 08/20/2018  . Family disruption due to divorce or legal separation 06/03/2015  . Adjustment disorder with mixed anxiety and depressed mood 06/03/2015  . Family history of alcoholism 01/08/2015     Chief Complaint  Patient presents with  . SEXUALLY TRANSMITTED DISEASE    HPI  Patient reports that she has had increased amount of discharge with odor for 2 weeks.  Denies other symptoms and declines blood work today.   See flowsheet for further details and programmatic requirements.    The following portions of the patient's history were reviewed and updated as appropriate: allergies, current medications, past medical history, past social history, past surgical history and problem list.  Objective:  There were no vitals filed for this visit.  Physical Exam Constitutional:      General: She is not in acute distress.    Appearance: Normal appearance.  HENT:     Head: Normocephalic and atraumatic.     Mouth/Throat:     Mouth: Mucous membranes are moist.     Pharynx: Oropharynx is clear. No oropharyngeal exudate or posterior oropharyngeal erythema.  Eyes:     Conjunctiva/sclera: Conjunctivae normal.  Neck:     Musculoskeletal: Neck supple.  Pulmonary:     Effort: Pulmonary effort is normal.  Abdominal:     Palpations: Abdomen is soft. There is no mass.     Tenderness: There is no abdominal tenderness. There is no guarding or rebound.  Genitourinary:    General: Normal vulva.     Rectum: Normal.     Comments: External genitalia/pubic area without nits, lice, edema, erythema, lesions and  inguinal adenopathy. Vagina with normal mucosa, small amount of thin, white, adherent discharge , pH=>4.5. Cervix without visible lesions. Uterus normal size, firm, mobile, nt, no masses, no CMT, no adnexal tenderness or fullness.  Lymphadenopathy:     Cervical: No cervical adenopathy.  Skin:    General: Skin is warm and dry.     Findings: No bruising, erythema, lesion or rash.  Neurological:     Mental Status: She is alert.  Psychiatric:        Mood and Affect: Mood normal.        Behavior: Behavior normal.        Thought Content: Thought content normal.        Judgment: Judgment normal.       Assessment and Plan:  Kaitlyn Turner is a 33 y.o. female presenting to the [facility] Department for STI screening  1. Screening for STD (sexually transmitted disease) Patient with symptoms today.  Declines blood work today. Rec condoms with all sex Await test results.  Counseled that RN will call if needs to RTC for treatment once results are back.  - WET PREP FOR [place], YEAST, CLUE - Gonococcus culture - [facility]  2. BV (bacterial vaginosis) Will treat for BV with Metronidazole 500mg  #14 1 po BID for 7 days with food, no EtOH for 24 hr before and until 72 hr after completing medicine. No sex for 7 days Counseled to use OTC antifungal cream if has itching  during or just after antibiotic use.  - metroNIDAZOLE (FLAGYL) 500 MG tablet; Take 1 tablet (500 mg total) by mouth 2 (two) times daily.  Dispense: 14 tablet; Refill: 0     No follow-ups on file.  No future appointments.  Kaitlyn Dilling, PA

## 2019-01-22 LAB — GONOCOCCUS CULTURE

## 2019-02-16 ENCOUNTER — Other Ambulatory Visit: Payer: Self-pay

## 2019-02-16 ENCOUNTER — Emergency Department
Admission: EM | Admit: 2019-02-16 | Discharge: 2019-02-16 | Disposition: A | Discharge status: Home / Self Care | Payer: Medicaid Other | Attending: Emergency Medicine | Admitting: Emergency Medicine

## 2019-02-16 DIAGNOSIS — H00011 Hordeolum externum right upper eyelid: Secondary | ICD-10-CM | POA: Diagnosis not present

## 2019-02-16 DIAGNOSIS — R600 Localized edema: Secondary | ICD-10-CM | POA: Diagnosis present

## 2019-02-16 MED ORDER — SULFAMETHOXAZOLE-TRIMETHOPRIM 800-160 MG PO TABS: 1.0000 | ORAL_TABLET | Freq: Two times a day (BID) | ORAL | 0 refills | Status: AC

## 2019-02-16 NOTE — ED Triage Notes (Signed)
Swelling to upper right eyelid for over a week, was prescribed ABX eye drop at Urgent Care approx 1 week ago. Reports swelling to right eyelid is causing pressure to eye resulting in blurry vision. Pt alert and oriented X4, cooperative, RR even and unlabored, color WNL. Pt in NAD.

## 2019-02-16 NOTE — Discharge Instructions (Signed)
Apply warm compress these to right upper eyelid 3 times daily for the next 7 days. Take Bactrim twice daily for the next week.

## 2019-02-16 NOTE — ED Provider Notes (Signed)
[facility] Emergency Department Provider Note  ____________________________________________  Time seen: Approximately 5:05 PM  I have reviewed the triage vital signs and the nursing notes.   HISTORY  Chief Complaint Facial Swelling    HPI Kaitlyn Turner is a 33 y.o. female presents to the emergency department with a or delirium of the right upper eyelid that is been present for the past 5 to 7 days.  Patient was seen and evaluated at urgent care and was prescribed antibiotic eyedrops.  Patient reports her symptoms have not been improving.  She has tried warm compresses at home.  No periorbital erythema.  Patient has no pain with right eye movement.  No fever noted at home.  No other alleviating measures have been attempted.        Past Medical History:  Diagnosis Date  . Family history of alcoholism 01/08/2015   Per [employer]  . Hypotension   . Malignant hyperthermia   . Pulmonary embolism [facility])     Patient Active Problem List   Diagnosis Date Noted  . Chest pain 01/06/2019  . MDD (major depressive disorder), recurrent episode ([place]) 08/21/2018  . Suicidal ideation 08/20/2018  . Family disruption due to divorce or legal separation 06/03/2015  . Adjustment disorder with mixed anxiety and depressed mood 06/03/2015  . Family history of alcoholism 01/08/2015    Past Surgical History:  Procedure Laterality Date  . DENTAL SURGERY      Prior to Admission medications   Medication Sig Start Date End Date Taking? Authorizing Provider  colchicine 0.6 MG tablet Take 1 tablet (0.6 mg total) by mouth daily as needed (pleuritic chest pain). 01/07/19   Gladstone Lighter, MD  etonogestrel (NEXPLANON) 68 MG IMPL implant 1 each by Subdermal route once. 07/21/16 07/22/19  Jerene Dilling, PA  metroNIDAZOLE (FLAGYL) 500 MG tablet Take 1 tablet (500 mg total) by mouth 2 (two) times daily. 01/17/19   Jerene Dilling, PA  sulfamethoxazole-trimethoprim (BACTRIM DS)  800-160 MG tablet Take 1 tablet by mouth 2 (two) times daily for 7 days. 02/16/19 02/23/19  Lannie Fields, PA-C  traZODone (DESYREL) 50 MG tablet Take 1 tablet (50 mg total) by mouth at bedtime and may repeat dose one time if needed. For sleep 08/23/18 01/06/19  Connye Burkitt, NP    Allergies Oxycodone, Stadol [butorphanol], Keflex [cephalexin], Other, Pork-derived products, Toradol [ketorolac tromethamine], and Tramadol  Family History  Family history unknown: Yes    Social History Social History   Tobacco Use  . Smoking status: Never Smoker  . Smokeless tobacco: Never Used  Substance Use Topics  . Alcohol use: Yes    Comment: occasionally  . Drug use: No     Review of Systems  Constitutional: No fever/chills Eyes: Patient has hordeolum of right eye. ENT: No upper respiratory complaints. Cardiovascular: no chest pain. Respiratory: no cough. No SOB. Gastrointestinal: No abdominal pain.  No nausea, no vomiting.  No diarrhea.  No constipation. Genitourinary: Negative for dysuria. No hematuria Musculoskeletal: Negative for musculoskeletal pain. Skin: Negative for rash, abrasions, lacerations, ecchymosis. Neurological: Negative for headaches, focal weakness or numbness.   ____________________________________________   PHYSICAL EXAM:  VITAL SIGNS: ED Triage Vitals  Enc Vitals Group     BP 02/16/19 1407 (!) 148/76     Pulse Rate 02/16/19 1407 77     Resp 02/16/19 1407 16     Temp 02/16/19 1407 98.5 F (36.9 C)     Temp Source 02/16/19 1407 Oral  SpO2 02/16/19 1407 98 %     Weight 02/16/19 1408 179 lb (81.2 kg)     Height 02/16/19 1408 5\' 6"  (1.676 m)     Head Circumference --      Peak Flow --      Pain Score 02/16/19 1413 4     Pain Loc --      Pain Edu? --      Excl. in [place]? --      Constitutional: Alert and oriented. Well appearing and in no acute distress. Eyes: Conjunctivae are normal. PERRL. EOMI. hordeolum of right upper eyelid visualized.  No  periorbital erythema on the right. Head: Atraumatic. ENT:      Ears:       Nose: No congestion/rhinnorhea.      Mouth/Throat: Mucous membranes are moist.  Cardiovascular: Normal rate, regular rhythm. Normal S1 and S2.  Good peripheral circulation. Respiratory: Normal respiratory effort without tachypnea or retractions. Lungs CTAB. Good air entry to the bases with no decreased or absent breath sounds.  Skin:  Skin is warm, dry and intact. No rash noted.   ____________________________________________   LABS (all labs ordered are listed, but only abnormal results are displayed)  Labs Reviewed - No data to display ____________________________________________  EKG   ____________________________________________  RADIOLOGY  No results found.  ____________________________________________    PROCEDURES  Procedure(s) performed:    Procedures    Medications - No data to display   ____________________________________________   INITIAL IMPRESSION / ASSESSMENT AND PLAN / ED COURSE  Pertinent labs & imaging results that were available during my care of the patient were reviewed by me and considered in my medical decision making (see chart for details).  Review of the [place] CSRS was performed in accordance of the [place] prior to dispensing any controlled drugs.         Assessment and plan Hordeolum 33 year old female presents to the emergency department with a hordeolum of the right upper eyelid that is been present for the past 5 days.  Patient was advised to continue using warm compresses and to gently exfoliate affected area.  She was discharged with Bactrim as patient is pen allergic.  She was advised to follow-up with primary care as needed.  All patient questions were answered.    ____________________________________________  FINAL CLINICAL IMPRESSION(S) / ED DIAGNOSES  Final diagnoses:  Hordeolum externum of right upper eyelid      NEW MEDICATIONS STARTED  DURING THIS VISIT:  ED Discharge Orders         Ordered    sulfamethoxazole-trimethoprim (BACTRIM DS) 800-160 MG tablet  2 times daily     02/16/19 1518              This chart was dictated using voice recognition software/Dragon. Despite best efforts to proofread, errors can occur which can change the meaning. Any change was purely unintentional.    Lannie Fields, PA-C 02/16/19 1709    Blake Divine, MD 02/17/19 0005

## 2019-02-16 NOTE — ED Notes (Signed)
Pt has swelling to upper right lid

## 2019-02-23 ENCOUNTER — Other Ambulatory Visit: Payer: Self-pay

## 2019-02-23 ENCOUNTER — Encounter: Payer: Self-pay | Admitting: Emergency Medicine

## 2019-02-23 ENCOUNTER — Ambulatory Visit
Admission: EM | Admit: 2019-02-23 | Discharge: 2019-02-23 | Disposition: A | Discharge status: Home / Self Care | Payer: Medicaid Other | Attending: Urgent Care | Admitting: Urgent Care

## 2019-02-23 ENCOUNTER — Ambulatory Visit: Payer: Medicaid Other

## 2019-02-23 DIAGNOSIS — Z86711 Personal history of pulmonary embolism: Secondary | ICD-10-CM | POA: Insufficient documentation

## 2019-02-23 DIAGNOSIS — W010XXA Fall on same level from slipping, tripping and stumbling without subsequent striking against object, initial encounter: Secondary | ICD-10-CM | POA: Insufficient documentation

## 2019-02-23 DIAGNOSIS — W19XXXA Unspecified fall, initial encounter: Secondary | ICD-10-CM | POA: Diagnosis not present

## 2019-02-23 DIAGNOSIS — M62838 Other muscle spasm: Secondary | ICD-10-CM | POA: Insufficient documentation

## 2019-02-23 DIAGNOSIS — Z881 Allergy status to other antibiotic agents status: Secondary | ICD-10-CM | POA: Insufficient documentation

## 2019-02-23 DIAGNOSIS — Z888 Allergy status to other drugs, medicaments and biological substances status: Secondary | ICD-10-CM | POA: Insufficient documentation

## 2019-02-23 DIAGNOSIS — Z885 Allergy status to narcotic agent status: Secondary | ICD-10-CM | POA: Diagnosis not present

## 2019-02-23 DIAGNOSIS — M25512 Pain in left shoulder: Secondary | ICD-10-CM | POA: Diagnosis present

## 2019-02-23 MED ORDER — METHOCARBAMOL 500 MG PO TABS: 500.0000 mg | ORAL_TABLET | Freq: Two times a day (BID) | ORAL | 0 refills | Status: DC | PRN

## 2019-02-23 NOTE — ED Triage Notes (Signed)
Patient states that she fell and injured her left shoulder 2 days ago.

## 2019-02-23 NOTE — Discharge Instructions (Signed)
It was very nice seeing you today in clinic. Thank you for entrusting me with your care.   Rest and ice shoulder. Use medication as provided. Your prescriptions have been called in to your pharmacy.   Make arrangements to follow up with your regular doctor in 1 week for re-evaluation if not improving. If your symptoms/condition worsens, please seek follow up care either here or in the ER. Please remember, our [facility] providers are "right here with you" when you need [place].   Again, it was my pleasure to take care of you today. Thank you for choosing our clinic. I hope that you start to feel better quickly.   Honor Loh, MSN, APRN, FNP-C, CEN Advanced Practice Provider [facility]

## 2019-02-23 NOTE — ED Provider Notes (Signed)
[place]   Name: Kaitlyn Turner DOB: 11/30/1985 MRN: [id number] CSN: [id number] PCP: Patient, No Pcp Per  Arrival date and time:  02/23/19 1317  Chief Complaint:  Shoulder Pain (left)   NOTE: Prior to seeing the patient today, I have reviewed the triage nursing documentation and vital signs. Clinical staff has updated patient's PMH/PSHx, current medication list, and drug allergies/intolerances to ensure comprehensive history available to assist in medical decision making.   History:   HPI: Kaitlyn Turner is a 33 y.o. female who presents today with complaints of pain in her LEFT shoulder following a mechanical fall x 2 days ago. Patient reports that she tripped and fell to the ground from a standing position. Pain started off as a "mild soreness", however last night patient notes that the pain increased to the point of her being unable to sleep. Patient denies weakness and distal paraesthesias in her LEFT upper extremity. Patient denies previous injuries or surgical intervention to her shoulder. She denies pain in her neck, however notes pain in her LEFT trapezius area. In efforts to conservatively manage her symptoms at home, the patient notes that she has used APAP alone, which has not helped to improve her symptoms significantly.   Past Medical History:  Diagnosis Date  . Family history of alcoholism 01/08/2015   Per Centricity  . Hypotension   . Malignant hyperthermia   . Pulmonary embolism [facility])     Past Surgical History:  Procedure Laterality Date  . DENTAL SURGERY      Family History  Family history unknown: Yes    Social History   Tobacco Use  . Smoking status: Never Smoker  . Smokeless tobacco: Never Used  Substance Use Topics  . Alcohol use: Yes    Comment: occasionally  . Drug use: No    Patient Active Problem List   Diagnosis Date Noted  . Chest pain 01/06/2019  . MDD (major depressive disorder), recurrent episode ([place]) 08/21/2018  . Suicidal  ideation 08/20/2018  . Family disruption due to divorce or legal separation 06/03/2015  . Adjustment disorder with mixed anxiety and depressed mood 06/03/2015  . Family history of alcoholism 01/08/2015    Home Medications:    Current Meds  Medication Sig  . etonogestrel (NEXPLANON) 68 MG IMPL implant 1 each by Subdermal route once.  . [EXPIRED] sulfamethoxazole-trimethoprim (BACTRIM DS) 800-160 MG tablet Take 1 tablet by mouth 2 (two) times daily for 7 days.    Allergies:   Oxycodone, Stadol [butorphanol], Keflex [cephalexin], Other, Pork-derived products, Toradol [ketorolac tromethamine], and Tramadol  Review of Systems (ROS): Review of Systems  Constitutional: Negative for chills and fever.  Respiratory: Negative for cough and shortness of breath.   Cardiovascular: Negative for chest pain and palpitations.  Musculoskeletal: Positive for back pain.       Acute LEFT shoulder pain s/p fall  Neurological: Negative for weakness and numbness.  All other systems reviewed and are negative.    Vital Signs: Today's Vitals   02/23/19 1332 02/23/19 1335 02/23/19 1455  BP:  114/72   Pulse:  62   Resp:  14   Temp:  98.4 F (36.9 C)   TempSrc:  Oral   SpO2:  100%   Weight: 179 lb (81.2 kg)    Height: 5\' 6"  (1.676 m)    PainSc: 6   6     Physical Exam: Physical Exam  Constitutional: She is oriented to person, place, and time and well-developed, well-nourished, and in no  distress. No distress.  HENT:  Head: Normocephalic and atraumatic.  Eyes: Pupils are equal, round, and reactive to light. Conjunctivae are normal.  Neck: Normal range of motion. Neck supple.  Cardiovascular: Normal rate, regular rhythm, normal heart sounds and intact distal pulses. Exam reveals no gallop and no friction rub.  No murmur heard. Pulmonary/Chest: Effort normal and breath sounds normal. No respiratory distress. She has no wheezes. She has no rales.  Abdominal: Bowel sounds are normal.   Musculoskeletal:     Left shoulder: She exhibits decreased range of motion, swelling and pain. She exhibits normal pulse and normal strength.     Cervical back: She exhibits tenderness and spasm. She exhibits no swelling.  Neurological: She is alert and oriented to person, place, and time. Gait normal.  Skin: Skin is warm and dry. No rash noted. She is not diaphoretic.  Psychiatric: Mood, memory, affect and judgment normal.  Nursing note and vitals reviewed.   Urgent Care Treatments / Results:   LABS: PLEASE NOTE: all labs that were ordered this encounter are listed, however only abnormal results are displayed. Labs Reviewed - No data to display  EKG: -None  RADIOLOGY: Dg Shoulder Left  Result Date: 02/23/2019 CLINICAL DATA:  Left shoulder pain due to an injury when the patient fell 2 days ago. Initial encounter. EXAM: LEFT SHOULDER - 2+ VIEW COMPARISON:  None. FINDINGS: There is no evidence of fracture or dislocation. There is no evidence of arthropathy or other focal bone abnormality. Soft tissues are unremarkable. IMPRESSION: Normal exam. Electronically Signed   By: Inge Rise M.D.   On: 02/23/2019 14:35   PROCEDURES: Procedures  MEDICATIONS RECEIVED THIS VISIT: Medications - No data to display  PERTINENT CLINICAL COURSE NOTES/UPDATES:   Initial Impression / Assessment and Plan / Urgent Care Course:  Pertinent labs & imaging results that were available during my care of the patient were personally reviewed by me and considered in my medical decision making (see lab/imaging section of note for values and interpretations).  Kaitlyn Turner is a 33 y.o. female who presents to [facility] today with complaints of Shoulder Pain (left)   Patient is well appearing overall in clinic today. She does not appear to be in any acute distress. Presenting symptoms (see HPI) and exam as documented above. Diagnostic radiographs of the LEFT shoulder revealed no acute  abnormalities; no fracture, dislocation, or effusion. Area of spasm noted overlying LEFT trapezius area. Will pursue treatment using anti-inflammatory (ibuprofen) medication and skeletal muscle relaxer (methocarbamol). She was educated on complimentary modalities to help with her pain. Patient encouraged to rest and avoid twisting/bending/lifting. She will likely find added benefit of applying  heat and/or ice TID for at least 10-15 minutes at a time; written information provided on today's AVS.   Current clinical condition warrants patient being out of work in order to recover from her current injury/illness. She was provided with the appropriate documentation to provide to her place of employment that will allow for her to RTW on 02/26/2019 with no restrictions.   Discussed follow up with primary care physician in 1 week for re-evaluation. I have reviewed the follow up and strict return precautions for any new or worsening symptoms. Patient is aware of symptoms that would be deemed urgent/emergent, and would thus require further evaluation either here or in the emergency department. At the time of discharge, she verbalized understanding and consent with the discharge plan as it was reviewed with her. All questions were fielded by  provider and/or clinic staff prior to patient discharge.    Final Clinical Impressions / Urgent Care Diagnoses:   Final diagnoses:  Acute pain of left shoulder  Muscle spasm  Fall, initial encounter    New Prescriptions:   Controlled Substance Registry consulted? Not Applicable  Meds ordered this encounter  Medications  . methocarbamol (ROBAXIN) 500 MG tablet    Sig: Take 1 tablet (500 mg total) by mouth 2 (two) times daily as needed for muscle spasms.    Dispense:  10 tablet    Refill:  0    Recommended Follow up Care:  Patient encouraged to follow up with the following provider within the specified time frame, or sooner as dictated by the severity of her  symptoms. As always, she was instructed that for any urgent/emergent care needs, she should seek care either here or in the emergency department for more immediate evaluation.  Follow-up Information    PCP In 1 week.   Why: General reassessment of symptoms if not improving        NOTE: This note was prepared using Lobbyist along with smaller Company secretary. Despite my best ability to proofread, there is the potential that transcriptional errors may still occur from this process, and are completely unintentional.    Karen Kitchens, NP 02/24/19 1431

## 2019-04-16 ENCOUNTER — Emergency Department
Admission: EM | Admit: 2019-04-16 | Discharge: 2019-04-16 | Disposition: A | Discharge status: Home / Self Care | Payer: Medicaid Other | Attending: Emergency Medicine | Admitting: Emergency Medicine

## 2019-04-16 ENCOUNTER — Other Ambulatory Visit: Payer: Self-pay

## 2019-04-16 ENCOUNTER — Encounter: Payer: Self-pay | Admitting: Emergency Medicine

## 2019-04-16 DIAGNOSIS — U071 COVID-19: Secondary | ICD-10-CM | POA: Diagnosis not present

## 2019-04-16 DIAGNOSIS — Z79899 Other long term (current) drug therapy: Secondary | ICD-10-CM | POA: Diagnosis not present

## 2019-04-16 DIAGNOSIS — R509 Fever, unspecified: Secondary | ICD-10-CM | POA: Diagnosis present

## 2019-04-16 MED ORDER — ONDANSETRON 4 MG PO TBDP: 4.0000 mg | ORAL_TABLET | Freq: Three times a day (TID) | ORAL | 0 refills | Status: DC | PRN

## 2019-04-16 MED ORDER — ONDANSETRON 4 MG PO TBDP
4.0000 mg | ORAL_TABLET | Freq: Once | ORAL | Status: AC
  Administered 2019-04-16: 4 mg via ORAL
  Filled 2019-04-16: qty 1

## 2019-04-16 NOTE — ED Provider Notes (Signed)
[facility] Emergency Department Provider Note   ____________________________________________    I have reviewed the triage vital signs and the nursing notes.   HISTORY  Chief Complaint Fever, Cough, and loss of smell     HPI Kaitlyn Turner is a 33 y.o. female presents with complaints of fever, chills, cough, loss of smell for approximately 3 days.  She denies shortness of breath.  Some myalgias noted.  Is not take anything for this.  No sick contacts reported.  No recent travel.  Mild nausea no abdominal pain  Past Medical History:  Diagnosis Date  . Family history of alcoholism 01/08/2015   Per Centricity  . Hypotension   . Malignant hyperthermia   . Pulmonary embolism [facility])     Patient Active Problem List   Diagnosis Date Noted  . Chest pain 01/06/2019  . MDD (major depressive disorder), recurrent episode ([place]) 08/21/2018  . Suicidal ideation 08/20/2018  . Family disruption due to divorce or legal separation 06/03/2015  . Adjustment disorder with mixed anxiety and depressed mood 06/03/2015  . Family history of alcoholism 01/08/2015    Past Surgical History:  Procedure Laterality Date  . DENTAL SURGERY      Prior to Admission medications   Medication Sig Start Date End Date Taking? Authorizing Provider  etonogestrel (NEXPLANON) 68 MG IMPL implant 1 each by Subdermal route once. 07/21/16 07/22/19  Jerene Dilling, PA  ondansetron (ZOFRAN ODT) 4 MG disintegrating tablet Take 1 tablet (4 mg total) by mouth every 8 (eight) hours as needed. 04/16/19   Lavonia Drafts, MD  colchicine 0.6 MG tablet Take 1 tablet (0.6 mg total) by mouth daily as needed (pleuritic chest pain). 01/07/19 02/23/19  Gladstone Lighter, MD  traZODone (DESYREL) 50 MG tablet Take 1 tablet (50 mg total) by mouth at bedtime and may repeat dose one time if needed. For sleep 08/23/18 01/06/19  Connye Burkitt, NP     Allergies Oxycodone, Stadol [butorphanol], Keflex  [cephalexin], Other, Pork-derived products, Toradol [ketorolac tromethamine], and Tramadol  Family History  Family history unknown: Yes    Social History Social History   Tobacco Use  . Smoking status: Never Smoker  . Smokeless tobacco: Never Used  Substance Use Topics  . Alcohol use: Yes    Comment: occasionally  . Drug use: No    Review of Systems  Constitutional: As above  ENT: No sore throat.   Gastrointestinal: As above Genitourinary: Negative for dysuria. Musculoskeletal:  positive myalgias Skin: Negative for rash. Neurological: Occasional headache    ____________________________________________   PHYSICAL EXAM:  VITAL SIGNS: ED Triage Vitals  Enc Vitals Group     BP 04/16/19 1324 101/63     Pulse Rate 04/16/19 1324 71     Resp 04/16/19 1324 18     Temp 04/16/19 1324 98.9 F (37.2 C)     Temp Source 04/16/19 1324 Oral     SpO2 04/16/19 1324 100 %     Weight 04/16/19 1326 84.8 kg (187 lb)     Height 04/16/19 1326 1.676 m (5\' 6" )     Head Circumference --      Peak Flow --      Pain Score 04/16/19 1325 0     Pain Loc --      Pain Edu? --      Excl. in [place]? --      Constitutional: Alert and oriented. No acute distress.  Eyes: Conjunctivae are normal.   Nose: No  congestion/rhinnorhea. Mouth/Throat: Mucous membranes are moist.   Cardiovascular: Normal rate, regular rhythm.  Respiratory: Normal respiratory effort.  No retractions.  Neurologic:  Normal speech and language. No gross focal neurologic deficits are appreciated.   Skin:  Skin is warm, dry and intact. No rash noted.   ____________________________________________   LABS (all labs ordered are listed, but only abnormal results are displayed)  Labs Reviewed  SARS CORONAVIRUS 2 (TAT 6-24 HRS)   ____________________________________________  EKG   ____________________________________________  RADIOLOGY   ____________________________________________   PROCEDURES   Procedure(s) performed: No  Procedures   Critical Care performed: No ____________________________________________   INITIAL IMPRESSION / ASSESSMENT AND PLAN / ED COURSE  Pertinent labs & imaging results that were available during my care of the patient were reviewed by me and considered in my medical decision making (see chart for details).  Given the loss of smell highly suspicious for COVID-19 as the cause of her symptoms.  PCR swab sent, recommend strict quarantine, supportive care   ____________________________________________   FINAL CLINICAL IMPRESSION(S) / ED DIAGNOSES  Final diagnoses:  Clinical diagnosis of COVID-19      NEW MEDICATIONS STARTED DURING THIS VISIT:  Discharge Medication List as of 04/16/2019  2:23 PM    START taking these medications   Details  ondansetron (ZOFRAN ODT) 4 MG disintegrating tablet Take 1 tablet (4 mg total) by mouth every 8 (eight) hours as needed., Starting Mon 04/16/2019, Normal         Note:  This document was prepared using Dragon voice recognition software and may include unintentional dictation errors.   Lavonia Drafts, MD 04/16/19 1450

## 2019-04-16 NOTE — ED Triage Notes (Signed)
Fever, cough and loss of smell x 4 days.

## 2019-04-17 ENCOUNTER — Telehealth: Payer: Self-pay | Admitting: Emergency Medicine

## 2019-04-17 LAB — SARS CORONAVIRUS 2 (TAT 6-24 HRS): SARS Coronavirus 2: POSITIVE — AB

## 2019-04-17 NOTE — ED Notes (Signed)
Received positive covid test result from the lab. Attempted to call patient without success. Left hippa compliant message on cell phone to call the ED back.

## 2019-04-17 NOTE — Telephone Encounter (Signed)
Called patient to assure she is aware of postive covid test.  She says she is aware.

## 2019-06-08 ENCOUNTER — Other Ambulatory Visit: Payer: Self-pay

## 2019-06-08 ENCOUNTER — Ambulatory Visit: Payer: Medicaid Other

## 2019-06-08 ENCOUNTER — Ambulatory Visit
Admission: EM | Admit: 2019-06-08 | Discharge: 2019-06-08 | Disposition: A | Discharge status: Home / Self Care | Payer: Medicaid Other | Attending: Family Medicine | Admitting: Family Medicine

## 2019-06-08 ENCOUNTER — Encounter: Payer: Self-pay | Admitting: Emergency Medicine

## 2019-06-08 DIAGNOSIS — S90112A Contusion of left great toe without damage to nail, initial encounter: Secondary | ICD-10-CM | POA: Insufficient documentation

## 2019-06-08 DIAGNOSIS — W208XXA Other cause of strike by thrown, projected or falling object, initial encounter: Secondary | ICD-10-CM | POA: Diagnosis not present

## 2019-06-08 NOTE — Discharge Instructions (Addendum)
Rest, ice, elevate, ibuprofen 600mg  three times daily, tylenol as needed

## 2019-06-08 NOTE — ED Triage Notes (Signed)
Patient c/o pain and swelling in her left big toe after hitting her toe on a 45lb weight yesterday.

## 2019-06-08 NOTE — ED Provider Notes (Signed)
[facility]    CSN: [id number] Arrival date & time: 06/08/19  1343      History   Chief Complaint Chief Complaint  Patient presents with  . Toe Pain    left big toe    HPI Kaitlyn Turner is a 34 y.o. female.   34 yo female with a c/o left great toe pain since yesterday after stubbing her toe against 45 lb weight. Denies any bleeding or nail damage.    Toe Pain    Past Medical History:  Diagnosis Date  . Family history of alcoholism 01/08/2015   Per Centricity  . Hypotension   . Malignant hyperthermia   . Pulmonary embolism [facility])     Patient Active Problem List   Diagnosis Date Noted  . Chest pain 01/06/2019  . MDD (major depressive disorder), recurrent episode ([place]) 08/21/2018  . Suicidal ideation 08/20/2018  . Family disruption due to divorce or legal separation 06/03/2015  . Adjustment disorder with mixed anxiety and depressed mood 06/03/2015  . Family history of alcoholism 01/08/2015    Past Surgical History:  Procedure Laterality Date  . DENTAL SURGERY      OB History    Gravida  8   Para  3   Term      Preterm      AB  5   Living  3     SAB      TAB      Ectopic      Multiple      Live Births               Home Medications    Prior to Admission medications   Medication Sig Start Date End Date Taking? Authorizing Provider  etonogestrel (NEXPLANON) 68 MG IMPL implant 1 each by Subdermal route once. 07/21/16 07/22/19  Jerene Dilling, PA  ondansetron (ZOFRAN ODT) 4 MG disintegrating tablet Take 1 tablet (4 mg total) by mouth every 8 (eight) hours as needed. 04/16/19   Lavonia Drafts, MD  colchicine 0.6 MG tablet Take 1 tablet (0.6 mg total) by mouth daily as needed (pleuritic chest pain). 01/07/19 02/23/19  Gladstone Lighter, MD  traZODone (DESYREL) 50 MG tablet Take 1 tablet (50 mg total) by mouth at bedtime and may repeat dose one time if needed. For sleep 08/23/18 01/06/19  Connye Burkitt, NP    Family  History Family History  Problem Relation Age of Onset  . Healthy Mother   . Healthy Father     Social History Social History   Tobacco Use  . Smoking status: Never Smoker  . Smokeless tobacco: Never Used  Substance Use Topics  . Alcohol use: Yes    Comment: occasionally  . Drug use: No     Allergies   Oxycodone, Stadol [butorphanol], Keflex [cephalexin], Other, Pork-derived products, Toradol [ketorolac tromethamine], and Tramadol   Review of Systems Review of Systems   Physical Exam Triage Vital Signs ED Triage Vitals  Enc Vitals Group     BP 06/08/19 1400 121/75     Pulse Rate 06/08/19 1400 68     Resp 06/08/19 1400 14     Temp 06/08/19 1400 98.6 F (37 C)     Temp Source 06/08/19 1400 Oral     SpO2 06/08/19 1400 100 %     Weight 06/08/19 1356 182 lb (82.6 kg)     Height 06/08/19 1356 5\' 6"  (1.676 m)     Head Circumference --  Peak Flow --      Pain Score 06/08/19 1356 7     Pain Loc --      Pain Edu? --      Excl. in [place]? --    No data found.  Updated Vital Signs BP 121/75 (BP Location: Left Arm)   Pulse 68   Temp 98.6 F (37 C) (Oral)   Resp 14   Ht 5\' 6"  (1.676 m)   Wt 82.6 kg   SpO2 100%   BMI 29.38 kg/m   Visual Acuity Right Eye Distance:   Left Eye Distance:   Bilateral Distance:    Right Eye Near:   Left Eye Near:    Bilateral Near:     Physical Exam Vitals and nursing note reviewed.  Constitutional:      General: She is not in acute distress.    Appearance: She is not toxic-appearing or diaphoretic.  Musculoskeletal:     Left foot: Normal range of motion and normal capillary refill. Bunion, tenderness and bony tenderness (over great toe) present. No swelling, Charcot foot, foot drop, prominent metatarsal heads, laceration or crepitus. Normal pulse.     Comments: Left foot neurovascularly intact  Neurological:     Mental Status: She is alert.      UC Treatments / Results  Labs (all labs ordered are listed, but only  abnormal results are displayed) Labs Reviewed - No data to display  EKG   Radiology DG Toe Great Left  Result Date: 06/08/2019 CLINICAL DATA:  Pain and swelling following blunt trauma, initial encounter EXAM: LEFT GREAT TOE COMPARISON:  None. FINDINGS: There is no evidence of fracture or dislocation. There is no evidence of arthropathy or other focal bone abnormality. Soft tissues are unremarkable. IMPRESSION: No acute abnormality noted. Electronically Signed   By: Inez Catalina M.D.   On: 06/08/2019 14:16    Procedures Procedures (including critical care time)  Medications Ordered in UC Medications - No data to display  Initial Impression / Assessment and Plan / UC Course  I have reviewed the triage vital signs and the nursing notes.  Pertinent labs & imaging results that were available during my care of the patient were reviewed by me and considered in my medical decision making (see chart for details).      Final Clinical Impressions(s) / UC Diagnoses   Final diagnoses:  Contusion of left great toe without damage to nail, initial encounter     Discharge Instructions     Rest, ice, elevate, ibuprofen 600mg  three times daily, tylenol as needed    ED Prescriptions    None      1. x-ray results (negative for fracture) and diagnosis reviewed with patient 2. Recommend supportive treatment as above 3. Follow-up prn if symptoms worsen or don't improve  I have reviewed the PDMP during this encounter.   Norval Gable, MD 06/08/19 [phone]

## 2019-06-17 ENCOUNTER — Emergency Department
Admission: EM | Admit: 2019-06-17 | Discharge: 2019-06-17 | Disposition: A | Discharge status: Home / Self Care | Payer: Managed Care, Other (non HMO) | Attending: Emergency Medicine | Admitting: Emergency Medicine

## 2019-06-17 ENCOUNTER — Encounter: Payer: Self-pay | Admitting: Emergency Medicine

## 2019-06-17 ENCOUNTER — Other Ambulatory Visit: Payer: Self-pay

## 2019-06-17 ENCOUNTER — Emergency Department: Payer: Managed Care, Other (non HMO)

## 2019-06-17 DIAGNOSIS — Z20822 Contact with and (suspected) exposure to covid-19: Secondary | ICD-10-CM | POA: Insufficient documentation

## 2019-06-17 DIAGNOSIS — R42 Dizziness and giddiness: Secondary | ICD-10-CM | POA: Diagnosis not present

## 2019-06-17 DIAGNOSIS — Z86711 Personal history of pulmonary embolism: Secondary | ICD-10-CM | POA: Diagnosis not present

## 2019-06-17 DIAGNOSIS — R0789 Other chest pain: Secondary | ICD-10-CM | POA: Diagnosis present

## 2019-06-17 DIAGNOSIS — R079 Chest pain, unspecified: Secondary | ICD-10-CM

## 2019-06-17 LAB — BASIC METABOLIC PANEL
Anion gap: 11 (ref 5–15)
BUN: 18 mg/dL (ref 6–20)
CO2: 22 mmol/L (ref 22–32)
Calcium: 9.5 mg/dL (ref 8.9–10.3)
Chloride: 105 mmol/L (ref 98–111)
Creatinine, Ser: 0.78 mg/dL (ref 0.44–1.00)
GFR calc Af Amer: >60 mL/min (ref >60)
GFR calc non Af Amer: >60 mL/min (ref >60)
Glucose, Bld: 96 mg/dL (ref 70–99)
Potassium: 3.8 mmol/L (ref 3.5–5.1)
Sodium: 138 mmol/L (ref 135–145)

## 2019-06-17 LAB — CBC
HCT: 37.3 % (ref 36.0–46.0)
Hemoglobin: 13 g/dL (ref 12.0–15.0)
MCH: 31.4 pg (ref 26.0–34.0)
MCHC: 34.9 g/dL (ref 30.0–36.0)
MCV: 90.1 fL (ref 80.0–100.0)
Platelets: 281 K/uL (ref 150–400)
RBC: 4.14 MIL/uL (ref 3.87–5.11)
RDW: 11.9 % (ref 11.5–15.5)
WBC: 9.8 K/uL (ref 4.0–10.5)
nRBC: 0 % (ref 0.0–0.2)

## 2019-06-17 LAB — TROPONIN I (HIGH SENSITIVITY)
Troponin I (High Sensitivity): 11 ng/L
Troponin I (High Sensitivity): 12 ng/L

## 2019-06-17 LAB — FIBRIN DERIVATIVES D-DIMER (ARMC ONLY): Fibrin derivatives D-dimer (ARMC): 220.52 ng{FEU}/mL (ref 0.00–499.00)

## 2019-06-17 LAB — POC SARS CORONAVIRUS 2 AG: SARS Coronavirus 2 Ag: NEGATIVE

## 2019-06-17 MED ORDER — DEXAMETHASONE SODIUM PHOSPHATE 10 MG/ML IJ SOLN
6.0000 mg | Freq: Once | INTRAMUSCULAR | Status: AC
  Administered 2019-06-17: 19:00:00 6 mg via INTRAMUSCULAR
  Filled 2019-06-17: qty 1

## 2019-06-17 MED ORDER — SODIUM CHLORIDE 0.9% FLUSH: 3.0000 mL | Freq: Once | INTRAVENOUS | Status: DC

## 2019-06-17 NOTE — ED Provider Notes (Signed)
[facility] Emergency Department Provider Note  ____________________________________________   First MD Initiated Contact with Patient 06/17/19 1608     (approximate)  I have reviewed the triage vital signs and the nursing notes.   HISTORY  Chief Complaint Chest Pain   HPI Kaitlyn Turner is a 34 y.o. female presents to the emergency department for treatment and evaluation  of left-sided chest pain.  Patient states that it woke her up last night and she got out of bed moved around a little bit and then was able to go back to sleep.  She woke up again this morning with some chest pain and feeling dizzy.  She states that this caused her to begin to feel very anxious and she had a "panic attack."  She has a history of PE and pericarditis.  Symptoms are not the same today.  She denies recent URI or other illness.  She denies known exposure to COVID-19.  No alleviating measures attempted prior to arrival.  Past Medical History:  Diagnosis Date  . Family history of alcoholism 01/08/2015   Per Centricity  . Hypotension   . Malignant hyperthermia   . Pulmonary embolism [facility])     Patient Active Problem List   Diagnosis Date Noted  . Chest pain 01/06/2019  . MDD (major depressive disorder), recurrent episode ([place]) 08/21/2018  . Suicidal ideation 08/20/2018  . Family disruption due to divorce or legal separation 06/03/2015  . Adjustment disorder with mixed anxiety and depressed mood 06/03/2015  . Family history of alcoholism 01/08/2015    Past Surgical History:  Procedure Laterality Date  . DENTAL SURGERY      Prior to Admission medications   Medication Sig Start Date End Date Taking? Authorizing Provider  etonogestrel (NEXPLANON) 68 MG IMPL implant 1 each by Subdermal route once. 07/21/16 07/22/19 Yes Jerene Dilling, PA  colchicine 0.6 MG tablet Take 1 tablet (0.6 mg total) by mouth daily as needed (pleuritic chest pain). 01/07/19 02/23/19  Gladstone Lighter, MD  traZODone (DESYREL) 50 MG tablet Take 1 tablet (50 mg total) by mouth at bedtime and may repeat dose one time if needed. For sleep 08/23/18 01/06/19  Connye Burkitt, NP    Allergies Oxycodone, Stadol [butorphanol], Keflex [cephalexin], Other, Pork-derived products, Toradol [ketorolac tromethamine], and Tramadol  Family History  Problem Relation Age of Onset  . Healthy Mother   . Healthy Father     Social History Social History   Tobacco Use  . Smoking status: Never Smoker  . Smokeless tobacco: Never Used  Substance Use Topics  . Alcohol use: Yes    Comment: occasionally  . Drug use: No    Review of Systems  Constitutional: No fever/chills. Eyes: No visual changes. ENT: No sore throat. Cardiovascular: Positive for chest pain.  Negative for pleuritic pain.  Negative for palpitations.  Negative for leg pain. Respiratory: Negative shortness of breath. Gastrointestinal: Negative for abdominal pain.  No nausea, no vomiting.  No diarrhea.  No constipation. Genitourinary: Negative for dysuria. Musculoskeletal: Negative for back pain.  Skin: Negative for rash, lesion, wound. Neurological: Negative for headaches, focal weakness or numbness. ____________________________________________   PHYSICAL EXAM:  VITAL SIGNS: ED Triage Vitals  Enc Vitals Group     BP 06/17/19 1432 129/90     Pulse Rate 06/17/19 1432 84     Resp 06/17/19 1432 16     Temp 06/17/19 1432 98.6 F (37 C)     Temp Source 06/17/19 1432 Oral  SpO2 06/17/19 1432 100 %     Weight 06/17/19 1434 181 lb (82.1 kg)     Height 06/17/19 1434 5\' 6"  (1.676 m)     Head Circumference --      Peak Flow --      Pain Score 06/17/19 1433 6     Pain Loc --      Pain Edu? --      Excl. in [place]? --     Constitutional: Alert and oriented.  Overall well appearing and in no acute distress.  Normal mental status. Eyes: Conjunctivae are normal. PERRL. Head: Atraumatic. Nose: No congestion/rhinnorhea.  Mouth/Throat: Mucous membranes are moist.  Oropharynx non-erythematous. Tongue normal in size and color. Neck: No stridor.  No carotid bruit appreciated on exam. Hematological/Lymphatic/Immunilogical: No cervical lymphadenopathy. Cardiovascular: Normal rate, regular rhythm. Grossly normal heart sounds.  Good peripheral circulation. Respiratory: Normal respiratory effort.  No retractions. Lungs CTAB. Gastrointestinal: Soft and nontender. No distention. No abdominal bruits. No CVA tenderness. Genitourinary: Exam deferred. Musculoskeletal: No lower extremity tenderness.  No edema of extremities. Neurologic:  Normal speech and language. No gross focal neurologic deficits are appreciated. Skin:  Skin is warm, dry and intact. No rash noted. Psychiatric: Mood and affect are normal. Speech and behavior are normal.  ____________________________________________   LABS (all labs ordered are listed, but only abnormal results are displayed)  Labs Reviewed  BASIC METABOLIC PANEL  CBC  FIBRIN DERIVATIVES D-DIMER ([place])  POC SARS CORONAVIRUS 2 AG -  ED  POC SARS CORONAVIRUS 2 AG  POC URINE PREG, ED  TROPONIN I (HIGH SENSITIVITY)  TROPONIN I (HIGH SENSITIVITY)   ____________________________________________  EKG  ED ECG REPORT I, Aharon Carriere, FNP-BC personally viewed and interpreted this ECG.   Date: 06/17/2019  EKG Time: 1425  Rate: 87  Rhythm: normal sinus rhythm  Axis: normal  Intervals:none  ST&T Change: no ST elevation  ____________________________________________  RADIOLOGY  ED MD interpretation: Chest x-ray shows no acute cardiopulmonary abnormality  I, Adelia Baptista, personally viewed and evaluated these images (plain radiographs) as part of my medical decision making, as well as reviewing the written report by the radiologist.  Official radiology report(s): DG Chest 2 View  Result Date: 06/17/2019 CLINICAL DATA:  Chest pain EXAM: CHEST - 2 VIEW COMPARISON:   01/06/2019 FINDINGS: The heart size and mediastinal contours are within normal limits. Both lungs are clear. The visualized skeletal structures are unremarkable. IMPRESSION: No active cardiopulmonary disease. Electronically Signed   By: Constance Holster M.D.   On: 06/17/2019 15:26    ____________________________________________   PROCEDURES  Procedure(s) performed: None  Procedures  Critical Care performed: No  ____________________________________________   INITIAL IMPRESSION / ASSESSMENT AND PLAN / ED COURSE   34 year old female presenting to the emergency department for treatment and evaluation of the left side chest pain that started last night.  See HPI for further details.  Plan will be to do cardiac work-up and include a D-dimer with her previous history of PE.  She is not currently on any blood thinners.  She denies any shortness of breath. ____________________________________________  Differential diagnosis includes, but not limited to:  Atypical chest pain, PE, pericarditis, anxiety   FINAL CLINICAL IMPRESSION(S) / ED DIAGNOSES  Serial troponins remain normal.  D-dimer is normal.  Rapid COVID-19 screening is negative. She has had some relief of the pressure after decadron, but still feels anxious, which she feels is contributing to her symptoms. She will be discharged home with referral to cardiology and  encouraged to see primary care this week if possible.   Final diagnoses:  Nonspecific chest pain     ED Discharge Orders    None       Creola Mclamb Neumeister was evaluated in Emergency Department on 06/17/2019 for the symptoms described in the history of present illness. She was evaluated in the context of the global COVID-19 pandemic, which necessitated consideration that the patient might be at risk for infection with the SARS-CoV-2 virus that causes COVID-19. Institutional protocols and algorithms that pertain to the evaluation of patients at risk for COVID-19 are in a  state of rapid change based on information released by regulatory bodies including the CDC and federal and state organizations. These policies and algorithms were followed during the patient's care in the ED.   Note:  This document was prepared using Dragon voice recognition software and may include unintentional dictation errors.   Victorino Dike, FNP 06/17/19 2024    Drenda Freeze, MD 06/21/19 1500

## 2019-06-17 NOTE — Discharge Instructions (Signed)
Please call and schedule follow-up appointment with cardiology.  If possible, please see primary care this week.  Return to the emergency department for symptoms of change or worsen if unable to schedule appointment.

## 2019-06-17 NOTE — ED Triage Notes (Signed)
FIRST NURSE NOTE- arrived EMS for chest pain.  Tearful and crying on arrival.  Per EMS pt appears anxious.  324 mg ASA and 1 NTG spray by EMS.

## 2019-06-17 NOTE — ED Triage Notes (Signed)
Pt to ED via [facility] from home for chest pain and dizziness. Pt states that the pain woke her up last night but she was able to go back to sleep. Woke up again this morning with chest pain and felt dizzy. Pt is anxious and has been crying since her arrival in the ER. Pt VS WNL.

## 2019-06-21 ENCOUNTER — Encounter: Payer: Self-pay | Admitting: Cardiology

## 2019-06-21 ENCOUNTER — Other Ambulatory Visit: Payer: Self-pay

## 2019-06-21 ENCOUNTER — Encounter: Payer: Self-pay | Admitting: *Deleted

## 2019-06-21 ENCOUNTER — Ambulatory Visit (INDEPENDENT_AMBULATORY_CARE_PROVIDER_SITE_OTHER): Payer: Medicaid Other | Admitting: Cardiology

## 2019-06-21 VITALS — BP 112/70 | HR 69 | Ht 66.0 in | Wt 183.0 lb

## 2019-06-21 DIAGNOSIS — R0781 Pleurodynia: Secondary | ICD-10-CM | POA: Diagnosis not present

## 2019-06-21 DIAGNOSIS — R42 Dizziness and giddiness: Secondary | ICD-10-CM | POA: Diagnosis not present

## 2019-06-21 DIAGNOSIS — R079 Chest pain, unspecified: Secondary | ICD-10-CM | POA: Diagnosis not present

## 2019-06-21 MED ORDER — COLCHICINE 0.6 MG PO TABS: 0.6000 mg | ORAL_TABLET | Freq: Every day | ORAL | 1 refills | Status: DC

## 2019-06-21 MED ORDER — PANTOPRAZOLE SODIUM 40 MG PO TBEC: 40.0000 mg | DELAYED_RELEASE_TABLET | Freq: Every day | ORAL | 0 refills | Status: DC

## 2019-06-21 MED ORDER — IBUPROFEN 600 MG PO TABS: ORAL_TABLET | ORAL | 0 refills | Status: DC

## 2019-06-21 NOTE — Progress Notes (Signed)
Cardiology Office Note:    Date:  06/21/2019   ID:  Kaitlyn Turner, DOB December 30, 1985, MRN [id number]  PCP:  Patient, No Pcp Per  Cardiologist:  No primary care provider on file.  Electrophysiologist:  None   Referring MD: No ref. provider found   Chief Complaint  Patient presents with  . New Patient (Initial Visit)    ED follow up. patient c.i chest pain, SOB, and swelling in ankels. Meds reviewed verbally with patient.     History of Present Illness:    Kaitlyn Turner is a 34 y.o. female with a hx of PE who presents with chest pain.  Patient states having chest pain and dizziness for some time now which is worsened over the past couple of days.  Chest pain is left-sided, sharp, sometimes associated with exertion and sometimes at rest.  Laying on her back exacerbates pain while sitting upright makes it better.  She was diagnosed with COVID-19 about 3 months ago.  Also states being seen in the ED for chest pain and diagnosed with pericarditis.  She was prescribed colchicine which she took for some time but symptoms did not resolve.  Patient with history of chest pain for years now.  Recently seen about 5 months ago in the ED for chest pain where work-up  chest CT angio was negative for PE. She had an echocardiogram on 12/2018 which showed normal systolic function with EF of 60 to 37%, normal diastolic function.  Past Medical History:  Diagnosis Date  . Family history of alcoholism 01/08/2015   Per Centricity  . Hypotension   . Malignant hyperthermia   . Pulmonary embolism [facility])     Past Surgical History:  Procedure Laterality Date  . DENTAL SURGERY      Current Medications: No outpatient medications have been marked as taking for the 06/21/19 encounter (Office Visit) with Kate Sable, MD.     Allergies:   Stadol [butorphanol], Keflex [cephalexin], Other, Pork-derived products, Toradol [ketorolac tromethamine], and Tramadol   Social History   Socioeconomic History  .  Marital status: Single    Spouse name: Not on file  . Number of children: Not on file  . Years of education: Not on file  . Highest education level: Not on file  Occupational History  . Not on file  Tobacco Use  . Smoking status: Never Smoker  . Smokeless tobacco: Never Used  Substance and Sexual Activity  . Alcohol use: Yes    Comment: occasionally  . Drug use: No  . Sexual activity: Yes    Birth control/protection: Implant  Other Topics Concern  . Not on file  Social History Narrative  . Not on file   Social Determinants of Health   Financial Resource Strain:   . Difficulty of Paying Living Expenses: Not on file  Food Insecurity:   . Worried About Charity fundraiser in the Last Year: Not on file  . Ran Out of Food in the Last Year: Not on file  Transportation Needs:   . Lack of Transportation (Medical): Not on file  . Lack of Transportation (Non-Medical): Not on file  Physical Activity:   . Days of Exercise per Week: Not on file  . Minutes of Exercise per Session: Not on file  Stress:   . Feeling of Stress : Not on file  Social Connections:   . Frequency of Communication with Friends and Family: Not on file  . Frequency of Social Gatherings with Friends and Family:  Not on file  . Attends Religious Services: Not on file  . Active Member of Clubs or Organizations: Not on file  . Attends Archivist Meetings: Not on file  . Marital Status: Not on file     Family History: The patient's family history includes Healthy in her father and mother.  ROS:   Please see the history of present illness.     All other systems reviewed and are negative.  EKGs/Labs/Other Studies Reviewed:    The following studies were reviewed today:   EKG:  EKG is  ordered today.  The ekg ordered today demonstrates normal sinus rhythm, normal ECG.  Recent Labs: 08/23/2018: TSH 3.208 01/06/2019: ALT 16 06/17/2019: BUN 18; Creatinine, Ser 0.78; Hemoglobin 13.0; Platelets 281;  Potassium 3.8; Sodium 138  Recent Lipid Panel No results found for: CHOL, TRIG, HDL, CHOLHDL, VLDL, LDLCALC, LDLDIRECT  Physical Exam:    VS:  BP 112/70 (BP Location: Left Arm, Patient Position: Sitting, Cuff Size: Normal)   Pulse 69   Ht 5' 6"  (1.676 m)   Wt 183 lb (83 kg)   SpO2 98%   BMI 29.54 kg/m     Wt Readings from Last 3 Encounters:  06/21/19 183 lb (83 kg)  06/17/19 181 lb (82.1 kg)  06/08/19 182 lb (82.6 kg)     GEN:  Well nourished, well developed in no acute distress HEENT: Normal NECK: No JVD; No carotid bruits LYMPHATICS: No lymphadenopathy CARDIAC: RRR, no murmurs, rubs, gallops RESPIRATORY:  Clear to auscultation without rales, wheezing or rhonchi  ABDOMEN: Soft, non-tender, non-distended MUSCULOSKELETAL:  No edema; No deformity  SKIN: Warm and dry NEUROLOGIC:  Alert and oriented x 3 PSYCHIATRIC:  Normal affect   ASSESSMENT:    1. Chest pain of uncertain etiology   2. Pleuritic chest pain   3. Dizziness    PLAN:      1. Patient describes atypical chest pain.  He has no cardiac risk factors to suggest CAD symptoms not consistent with angina.  Chest pain is pleuritic in nature suggesting pericarditis.  States being recently diagnosed with pericarditis but was given colchicine.  In light of recent Covid infection, will get echocardiogram to evaluate for any evidence for myocardial inflammation, and also to evaluate presence of pericarditis such as an effusion.  Will prophylactically treat patient with ibuprofen 600 mg 3 times daily with weekly taper once patient is symptom-free.  Will order CRP and ESR.  Start Protonix 40 mg daily.  Colchicine 0.6 mg twice daily.  2.  Patient with history of dizziness especially when waking up from seated position.  Orthostatic vitals in the office today did not reveal any evidence for orthostasis.  She however felt dizzy moving from sitting to standing positions.  This suggests possible vertigo.  Advised her to follow-up with  primary care regarding other treatment and management.  Follow-up after echo, ESR, CRP labs  This note was generated in part or whole with voice recognition software. Voice recognition is usually quite accurate but there are transcription errors that can and very often do occur. I apologize for any typographical errors that were not detected and corrected.  Medication Adjustments/Labs and Tests Ordered: Current medicines are reviewed at length with the patient today.  Concerns regarding medicines are outlined above.  Orders Placed This Encounter  Procedures  . C-reactive protein  . Sedimentation rate  . EKG 12-Lead  . ECHOCARDIOGRAM COMPLETE   Meds ordered this encounter  Medications  . ibuprofen (ADVIL) 600 MG  tablet    Sig: Take 600 mg by mouth 3 times a day for 2 weeks, then 600 mg two times a day for 1 week, and then 600 mg by mouth once a day for 1 week.    Dispense:  63 tablet    Refill:  0  . pantoprazole (PROTONIX) 40 MG tablet    Sig: Take 1 tablet (40 mg total) by mouth daily. For 1 month.    Dispense:  30 tablet    Refill:  0  . colchicine 0.6 MG tablet    Sig: Take 1 tablet (0.6 mg total) by mouth daily.    Dispense:  60 tablet    Refill:  1    Patient Instructions  Medication Instructions:  Your physician has recommended you make the following change in your medication:  1- TAKE Ibuprofen 600 mg - Take 1 tablet (600 mg) by mouth 3 times a day for 2 weeks, then 1 tablet (600 mg) two times a day for 1 week, then 1 tablet (600 mg) by mouth once a day for 1 week.  2- START Protonix 40 mg by mouth once a day for 1 month. 3- START Colchicine 0.6 mg by mouth two times a day.   *If you need a refill on your cardiac medications before your next appointment, please call your pharmacy*  Lab Work: Your physician recommends that you return for lab work in: TODAY - ESR, CRP.  If you have labs (blood work) drawn today and your tests are completely normal, you will receive  your results only by: Marland Kitchen MyChart Message (if you have MyChart) OR . A paper copy in the mail If you have any lab test that is abnormal or we need to change your treatment, we will call you to review the results.  Testing/Procedures: Your physician has requested that you have an echocardiogram. Echocardiography is a painless test that uses sound waves to create images of your heart. It provides your doctor with information about the size and shape of your heart and how well your heart's chambers and valves are working. This procedure takes approximately one hour. There are no restrictions for this procedure. You may get an IV, if needed, to receive an ultrasound enhancing agent through to better visualize your heart.   Follow-Up: At [facility], you and your health needs are our priority.  As part of our continuing mission to provide you with exceptional heart care, we have created designated Provider Care Teams.  These Care Teams include your primary Cardiologist (physician) and Advanced Practice Providers (APPs -  Physician Assistants and Nurse Practitioners) who all work together to provide you with the care you need, when you need it.  Your next appointment:   After echo complete.  The format for your next appointment:   In Person  Provider:   Kate Sable, MD   Echocardiogram An echocardiogram is a procedure that uses painless sound waves (ultrasound) to produce an image of the heart. Images from an echocardiogram can provide important information about:  Signs of coronary artery disease (CAD).  Aneurysm detection. An aneurysm is a weak or damaged part of an artery wall that bulges out from the normal force of blood pumping through the body.  Heart size and shape. Changes in the size or shape of the heart can be associated with certain conditions, including heart failure, aneurysm, and CAD.  Heart muscle function.  Heart valve function.  Signs of a past heart attack.   Fluid  buildup around the heart.  Thickening of the heart muscle.  A tumor or infectious growth around the heart valves. Tell a health care provider about:  Any allergies you have.  All medicines you are taking, including vitamins, herbs, eye drops, creams, and over-the-counter medicines.  Any blood disorders you have.  Any surgeries you have had.  Any medical conditions you have.  Whether you are pregnant or may be pregnant. What are the risks? Generally, this is a safe procedure. However, problems may occur, including:  Allergic reaction to dye (contrast) that may be used during the procedure. What happens before the procedure? No specific preparation is needed. You may eat and drink normally. What happens during the procedure?   An IV tube may be inserted into one of your veins.  You may receive contrast through this tube. A contrast is an injection that improves the quality of the pictures from your heart.  A gel will be applied to your chest.  A wand-like tool (transducer) will be moved over your chest. The gel will help to transmit the sound waves from the transducer.  The sound waves will harmlessly bounce off of your heart to allow the heart images to be captured in real-time motion. The images will be recorded on a computer. The procedure may vary among health care providers and hospitals. What happens after the procedure?  You may return to your normal, everyday life, including diet, activities, and medicines, unless your health care provider tells you not to do that. Summary  An echocardiogram is a procedure that uses painless sound waves (ultrasound) to produce an image of the heart.  Images from an echocardiogram can provide important information about the size and shape of your heart, heart muscle function, heart valve function, and fluid buildup around your heart.  You do not need to do anything to prepare before this procedure. You may eat and drink  normally.  After the echocardiogram is completed, you may return to your normal, everyday life, unless your health care provider tells you not to do that. This information is not intended to replace advice given to you by your health care provider. Make sure you discuss any questions you have with your health care provider. Document Revised: 08/24/2018 Document Reviewed: 06/05/2016 Elsevier Patient Education  2020 [place], Kate Sable, MD  06/21/2019 12:36 PM    [place]

## 2019-06-21 NOTE — Patient Instructions (Addendum)
Medication Instructions:  Your physician has recommended you make the following change in your medication:  1- TAKE Ibuprofen 600 mg - Take 1 tablet (600 mg) by mouth 3 times a day for 2 weeks, then 1 tablet (600 mg) two times a day for 1 week, then 1 tablet (600 mg) by mouth once a day for 1 week.  2- START Protonix 40 mg by mouth once a day for 1 month. 3- START Colchicine 0.6 mg by mouth two times a day.   *If you need a refill on your cardiac medications before your next appointment, please call your pharmacy*  Lab Work: Your physician recommends that you return for lab work in: TODAY - ESR, CRP.  If you have labs (blood work) drawn today and your tests are completely normal, you will receive your results only by: Marland Kitchen MyChart Message (if you have MyChart) OR . A paper copy in the mail If you have any lab test that is abnormal or we need to change your treatment, we will call you to review the results.  Testing/Procedures: Your physician has requested that you have an echocardiogram. Echocardiography is a painless test that uses sound waves to create images of your heart. It provides your doctor with information about the size and shape of your heart and how well your heart's chambers and valves are working. This procedure takes approximately one hour. There are no restrictions for this procedure. You may get an IV, if needed, to receive an ultrasound enhancing agent through to better visualize your heart.   Follow-Up: At [facility], you and your health needs are our priority.  As part of our continuing mission to provide you with exceptional heart care, we have created designated Provider Care Teams.  These Care Teams include your primary Cardiologist (physician) and Advanced Practice Providers (APPs -  Physician Assistants and Nurse Practitioners) who all work together to provide you with the care you need, when you need it.  Your next appointment:   After echo complete.  The format  for your next appointment:   In Person  Provider:   Kate Sable, MD   Echocardiogram An echocardiogram is a procedure that uses painless sound waves (ultrasound) to produce an image of the heart. Images from an echocardiogram can provide important information about:  Signs of coronary artery disease (CAD).  Aneurysm detection. An aneurysm is a weak or damaged part of an artery wall that bulges out from the normal force of blood pumping through the body.  Heart size and shape. Changes in the size or shape of the heart can be associated with certain conditions, including heart failure, aneurysm, and CAD.  Heart muscle function.  Heart valve function.  Signs of a past heart attack.  Fluid buildup around the heart.  Thickening of the heart muscle.  A tumor or infectious growth around the heart valves. Tell a health care provider about:  Any allergies you have.  All medicines you are taking, including vitamins, herbs, eye drops, creams, and over-the-counter medicines.  Any blood disorders you have.  Any surgeries you have had.  Any medical conditions you have.  Whether you are pregnant or may be pregnant. What are the risks? Generally, this is a safe procedure. However, problems may occur, including:  Allergic reaction to dye (contrast) that may be used during the procedure. What happens before the procedure? No specific preparation is needed. You may eat and drink normally. What happens during the procedure?   An IV tube  may be inserted into one of your veins.  You may receive contrast through this tube. A contrast is an injection that improves the quality of the pictures from your heart.  A gel will be applied to your chest.  A wand-like tool (transducer) will be moved over your chest. The gel will help to transmit the sound waves from the transducer.  The sound waves will harmlessly bounce off of your heart to allow the heart images to be captured in  real-time motion. The images will be recorded on a computer. The procedure may vary among health care providers and hospitals. What happens after the procedure?  You may return to your normal, everyday life, including diet, activities, and medicines, unless your health care provider tells you not to do that. Summary  An echocardiogram is a procedure that uses painless sound waves (ultrasound) to produce an image of the heart.  Images from an echocardiogram can provide important information about the size and shape of your heart, heart muscle function, heart valve function, and fluid buildup around your heart.  You do not need to do anything to prepare before this procedure. You may eat and drink normally.  After the echocardiogram is completed, you may return to your normal, everyday life, unless your health care provider tells you not to do that. This information is not intended to replace advice given to you by your health care provider. Make sure you discuss any questions you have with your health care provider. Document Revised: 08/24/2018 Document Reviewed: 06/05/2016 Elsevier Patient Education  [place].

## 2019-06-23 LAB — SEDIMENTATION RATE: Sed Rate: 13 mm/hr (ref 0–32)

## 2019-06-23 LAB — C-REACTIVE PROTEIN: CRP: 3 mg/L (ref 0–10)

## 2019-06-26 ENCOUNTER — Telehealth: Payer: Self-pay

## 2019-06-26 MED ORDER — COLCRYS 0.6 MG PO TABS: 0.6000 mg | ORAL_TABLET | Freq: Every day | ORAL | 1 refills | Status: DC

## 2019-06-26 NOTE — Telephone Encounter (Signed)
Received Covermymeds PA request. Unable to complete PA through covermymeds. Contacted [place] tracks and unable to process through [place] tracks. Contacted pharmacy and they mentioned Medicaid doesn't cover colchicine 0.6 mg bid escript Rx medication.  Pt doesn't require PA needs written prescription only for brand name Colchicine 0.6 mg bid to be covered. If no written Rx for Brand name pt is required to pay $135 for prescription. Please advise.

## 2019-06-26 NOTE — Telephone Encounter (Signed)
Called pharmacy and she said we would need to send in [place] with prescription for the brand of Colcrys. New Rx sent.    Called to make sure the Rx went through. Pharmacy tech said the prescription must be written and faxed to them to be accepted. Rx printed and placed on Dr. Thereasa Solo desk for signature tomorrow when he's in the office.

## 2019-06-27 NOTE — Telephone Encounter (Signed)
Called patient and let her know the situation. She is able to come by the office tomorrow to pick up the prescription. She was appreciative. Rx signed by Dr Garen Lah on watermark prescription paper and placed in envelope at front desk for patient to pick up.

## 2019-06-27 NOTE — Telephone Encounter (Signed)
Prescription signed by Dr. Garen Lah and faxed to patient's pharmacy [facility].   Called pharmacy to confirm they received the faxed prescription.  The pharmacist attempted to run the [place]. He received a message that said it required a physical prescription of watermark paper. We both agreed this seemed odd; however, I will work on getting another prescription signed. Patient will have to come by to pick it up and take it to the pharmacy.

## 2019-07-12 ENCOUNTER — Ambulatory Visit (INDEPENDENT_AMBULATORY_CARE_PROVIDER_SITE_OTHER): Payer: Medicaid Other

## 2019-07-12 ENCOUNTER — Other Ambulatory Visit: Payer: Self-pay

## 2019-07-12 DIAGNOSIS — R079 Chest pain, unspecified: Secondary | ICD-10-CM

## 2019-07-12 DIAGNOSIS — R42 Dizziness and giddiness: Secondary | ICD-10-CM

## 2019-07-16 ENCOUNTER — Other Ambulatory Visit: Payer: Self-pay

## 2019-07-16 ENCOUNTER — Encounter: Payer: Self-pay | Admitting: Cardiology

## 2019-07-16 ENCOUNTER — Ambulatory Visit (INDEPENDENT_AMBULATORY_CARE_PROVIDER_SITE_OTHER): Payer: Medicaid Other | Admitting: Cardiology

## 2019-07-16 VITALS — BP 120/84 | HR 71 | Ht 66.0 in | Wt 183.0 lb

## 2019-07-16 DIAGNOSIS — R079 Chest pain, unspecified: Secondary | ICD-10-CM

## 2019-07-16 DIAGNOSIS — R42 Dizziness and giddiness: Secondary | ICD-10-CM

## 2019-07-16 NOTE — Patient Instructions (Signed)

## 2019-07-16 NOTE — Progress Notes (Signed)
Cardiology Office Note:    Date:  07/16/2019   ID:  Kaitlyn Turner, DOB 03/16/1986, MRN [id number]  PCP:  [facility] [place]  Cardiologist:  No primary care provider on file.  Electrophysiologist:  None   Referring MD: No ref. provider found   Chief Complaint  Patient presents with  . office visit    F/U after echo; Meds verbally reviewed with patient.    History of Present Illness:    Kaitlyn Turner is a 34 y.o. female with a hx of PE who presents for follow-up.  She was last seen with chest pain.  Patient states having worsening chest pain for 2 months.  The chest pain symptoms was left-sided, sharp, appeared pleuritic roughly 3 months after being diagnosed with COVID-19.  She was diagnosed with pericarditis and prescribed colchicine.  Patient was started on NSAIDs and colchicine for pericarditis treatment.  Echocardiogram on 07/12/2019 showed normal systolic and diastolic function.  She states her symptoms of chest discomfort is improved.  She is able to run on a treadmill and sometimes has slight sharp chest pain.  When she was performing the echocardiogram, the echo probe because reproducible chest tenderness.  Her dizziness has also improved and resolved.  Past Medical History:  Diagnosis Date  . Family history of alcoholism 01/08/2015   Per [employer]  . Hypotension   . Malignant hyperthermia   . Pulmonary embolism [facility])     Past Surgical History:  Procedure Laterality Date  . DENTAL SURGERY      Current Medications: Current Meds  Medication Sig  . etonogestrel (NEXPLANON) 68 MG IMPL implant 1 each by Subdermal route once.     Allergies:   Stadol [butorphanol], Keflex [cephalexin], Other, Pork-derived products, Toradol [ketorolac tromethamine], and Tramadol   Social History   Socioeconomic History  . Marital status: Single    Spouse name: Not on file  . Number of children: Not on file  . Years of education: Not on file  . Highest  education level: Not on file  Occupational History  . Not on file  Tobacco Use  . Smoking status: Never Smoker  . Smokeless tobacco: Never Used  Substance and Sexual Activity  . Alcohol use: Yes    Comment: occasionally  . Drug use: No  . Sexual activity: Yes    Birth control/protection: Implant  Other Topics Concern  . Not on file  Social History Narrative  . Not on file   Social Determinants of Health   Financial Resource Strain:   . Difficulty of Paying Living Expenses: Not on file  Food Insecurity:   . Worried About Charity fundraiser in the Last Year: Not on file  . Ran Out of Food in the Last Year: Not on file  Transportation Needs:   . Lack of Transportation (Medical): Not on file  . Lack of Transportation (Non-Medical): Not on file  Physical Activity:   . Days of Exercise per Week: Not on file  . Minutes of Exercise per Session: Not on file  Stress:   . Feeling of Stress : Not on file  Social Connections:   . Frequency of Communication with Friends and Family: Not on file  . Frequency of Social Gatherings with Friends and Family: Not on file  . Attends Religious Services: Not on file  . Active Member of Clubs or Organizations: Not on file  . Attends Archivist Meetings: Not on file  . Marital Status: Not on file  Family History: The patient's family history includes Healthy in her father and mother.  ROS:   Please see the history of present illness.     All other systems reviewed and are negative.  EKGs/Labs/Other Studies Reviewed:    The following studies were reviewed today:   EKG:  EKG is  ordered today.  The ekg ordered today demonstrates normal sinus rhythm, sinus arrhythmia, normal ECG.  Recent Labs: 08/23/2018: TSH 3.208 01/06/2019: ALT 16 06/17/2019: BUN 18; Creatinine, Ser 0.78; Hemoglobin 13.0; Platelets 281; Potassium 3.8; Sodium 138  Recent Lipid Panel No results found for: CHOL, TRIG, HDL, CHOLHDL, VLDL, LDLCALC,  LDLDIRECT  Physical Exam:    VS:  BP 120/84 (BP Location: Left Arm, Patient Position: Sitting, Cuff Size: Normal)   Pulse 71   Ht _0  (1.676 m)   Wt 183 lb (83 kg)   SpO2 98%   BMI 29.54 kg/m     Wt Readings from Last 3 Encounters:  07/16/19 183 lb (83 kg)  06/21/19 183 lb (83 kg)  06/17/19 181 lb (82.1 kg)     GEN:  Well nourished, well developed in no acute distress HEENT: Normal NECK: No JVD; No carotid bruits LYMPHATICS: No lymphadenopathy CARDIAC: RRR, no murmurs, rubs, gallops RESPIRATORY:  Clear to auscultation without rales, wheezing or rhonchi  ABDOMEN: Soft, non-tender, non-distended MUSCULOSKELETAL:  No edema; No deformity  SKIN: Warm and dry NEUROLOGIC:  Alert and oriented x 3 PSYCHIATRIC:  Normal affect   ASSESSMENT:    1. Chest pain of uncertain etiology   2. Dizziness    PLAN:      1. Patient describes atypical chest pain.  He has no cardiac risk factors to suggest CAD symptoms not consistent with angina.  Chest pain is pleuritic in nature suggesting pericarditis.  Echocardiogram showed normal systolic and diastolic function with no pericardial effusion.  ESR and CRP were within normal limits.  Patient currently symptom-free after ibuprofen and colchicine.  Her chest discomfort is very atypical with echo probe causing reproducible tenderness.  Musculoskeletal etiology more likely.  2.  Patient with history of dizziness especially when waking up from seated position.  Orthostatic vitals in the office today did not reveal any evidence for orthostasis.  Symptoms are currently resolved.  Follow-up as needed  This note was generated in part or whole with voice recognition software. Voice recognition is usually quite accurate but there are transcription errors that can and very often do occur. I apologize for any typographical errors that were not detected and corrected.  Medication Adjustments/Labs and Tests Ordered: Current medicines are reviewed at length  with the patient today.  Concerns regarding medicines are outlined above.  No orders of the defined types were placed in this encounter.  No orders of the defined types were placed in this encounter.   Patient Instructions  Medication Instructions:  Your physician recommends that you continue on your current medications as directed. Please refer to the Current Medication list given to you today.  *If you need a refill on your cardiac medications before your next appointment, please call your pharmacy*   Lab Work: None If you have labs (blood work) drawn today and your tests are completely normal, you will receive your results only by: Marland Kitchen MyChart Message (if you have MyChart) OR . A paper copy in the mail If you have any lab test that is abnormal or we need to change your treatment, we will call you to review the results.   Testing/Procedures: None  Follow-Up: At [facility], you and your health needs are our priority.  As part of our continuing mission to provide you with exceptional heart care, we have created designated Provider Care Teams.  These Care Teams include your primary Cardiologist (physician) and Advanced Practice Providers (APPs -  Physician Assistants and Nurse Practitioners) who all work together to provide you with the care you need, when you need it.  We recommend signing up for the patient portal called "MyChart".  Sign up information is provided on this After Visit Summary.  MyChart is used to connect with patients for Virtual Visits (Telemedicine).  Patients are able to view lab/test results, encounter notes, upcoming appointments, etc.  Non-urgent messages can be sent to your provider as well.   To learn more about what you can do with MyChart, go to [email or web address].    Your next appointment:   As needed   The format for your next appointment:   In Person  Provider:   Kate Sable, MD   Other Instructions      Signed, Kate Sable, MD   07/16/2019 5:27 PM    [place]

## 2019-07-18 ENCOUNTER — Other Ambulatory Visit: Payer: Self-pay

## 2019-07-18 NOTE — Telephone Encounter (Signed)
Patient notified needs to contact her PCP for any refills on the Pantoprazole. Dr. Kate Sable will follow up only as needed and only aloud 1 month refill on the Pantoprazole.  Patient understands instructions and stated, " I don't even need the Pantoprazole.".

## 2019-07-25 ENCOUNTER — Encounter: Payer: Self-pay | Admitting: Emergency Medicine

## 2019-07-25 ENCOUNTER — Other Ambulatory Visit: Payer: Self-pay

## 2019-07-25 ENCOUNTER — Ambulatory Visit
Admission: EM | Admit: 2019-07-25 | Discharge: 2019-07-25 | Disposition: A | Discharge status: Home / Self Care | Payer: Medicaid Other | Attending: Emergency Medicine | Admitting: Emergency Medicine

## 2019-07-25 DIAGNOSIS — R1084 Generalized abdominal pain: Secondary | ICD-10-CM | POA: Diagnosis not present

## 2019-07-25 DIAGNOSIS — Z86711 Personal history of pulmonary embolism: Secondary | ICD-10-CM | POA: Insufficient documentation

## 2019-07-25 DIAGNOSIS — Z79899 Other long term (current) drug therapy: Secondary | ICD-10-CM | POA: Diagnosis not present

## 2019-07-25 DIAGNOSIS — Z20822 Contact with and (suspected) exposure to covid-19: Secondary | ICD-10-CM | POA: Diagnosis not present

## 2019-07-25 DIAGNOSIS — J029 Acute pharyngitis, unspecified: Secondary | ICD-10-CM | POA: Diagnosis present

## 2019-07-25 LAB — GROUP A STREP BY PCR: Group A Strep by PCR: NOT DETECTED

## 2019-07-25 MED ORDER — IBUPROFEN 600 MG PO TABS: 600.0000 mg | ORAL_TABLET | Freq: Four times a day (QID) | ORAL | 0 refills | Status: DC | PRN

## 2019-07-25 MED ORDER — ONDANSETRON 8 MG PO TBDP: ORAL_TABLET | ORAL | 0 refills | Status: DC

## 2019-07-25 NOTE — ED Provider Notes (Signed)
HPI  SUBJECTIVE:  Patient reports sore throat starting yesterday. Sx worse with swallowing.  Sx better with ibuprofen 600 mg No fever but reports chills No swollen neck glands   No neck stiffness  No Cough + nasal congestion but states that this is not unusual at this time of year.  No rhinorrhea No Myalgias No Headache No Rash  No loss of taste or smell No shortness of breath or difficulty breathing + nausea, no vomiting No diarrhea + Mild diffuse abdominal pain last night     No known recent Strep, mono, COVID exposure No reflux sxs No Allergy sxs  No Breathing difficulty, voice changes, sensation of throat swelling shut No Drooling No Trismus No abx in past month.  No antipyretic in past 4-6 hrs  Past medical history of Covid November 2020, allergies.  Has not yet gotten Covid vaccine.  No history of GERD frequent strep mono diabetes hypertension.  LMP: 2/15.  Denies the possibility of being pregnant PMD: [facility]   Past Medical History:  Diagnosis Date  . Family history of alcoholism 01/08/2015   Per [employer]  . Hypotension   . Malignant hyperthermia   . Pulmonary embolism [facility])     Past Surgical History:  Procedure Laterality Date  . DENTAL SURGERY      Family History  Problem Relation Age of Onset  . Healthy Mother   . Healthy Father     Social History   Tobacco Use  . Smoking status: Never Smoker  . Smokeless tobacco: Never Used  Substance Use Topics  . Alcohol use: Yes    Comment: occasionally  . Drug use: No    No current facility-administered medications for this encounter.  Current Outpatient Medications:  .  etonogestrel (NEXPLANON) 68 MG IMPL implant, 1 each by Subdermal route once., Disp: , Rfl:  .  COLCRYS 0.6 MG tablet, Take 1 tablet (0.6 mg total) by mouth daily., Disp: 60 tablet, Rfl: 1 .  ibuprofen (ADVIL) 600 MG tablet, Take 1 tablet (600 mg total) by mouth every 6 (six) hours as needed., Disp: 30 tablet, Rfl:  0 .  ondansetron (ZOFRAN ODT) 8 MG disintegrating tablet, 1/2- 1 tablet q 8 hr prn nausea, vomiting, Disp: 20 tablet, Rfl: 0 .  pantoprazole (PROTONIX) 40 MG tablet, Take 1 tablet (40 mg total) by mouth daily. For 1 month., Disp: 30 tablet, Rfl: 0  Allergies  Allergen Reactions  . Stadol [Butorphanol] Itching    Per Harden Mo, RN  . Keflex [Cephalexin] Hives  . Other     General anesthesia - reaction was hypothermia  . 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 Toradol [Ketorolac Tromethamine] Hives  . Tramadol Hives     ROS  As noted in HPI.   Physical Exam  BP (!) 135/91 (BP Location: Right Arm)   Pulse 84   Temp 99.2 F (37.3 C) (Oral)   Resp 18   Ht 5\' 6"  (1.676 m)   Wt 82.1 kg   SpO2 99%   BMI 29.21 kg/m   Constitutional: Well developed, well nourished, no acute distress Eyes:  EOMI, conjunctiva normal bilaterally HENT: Normocephalic, atraumatic,mucus membranes moist. + Mild nasal congestion + erythematous oropharynx - enlarged tonsils  - exudates.  No petechiae on palate. Uvula midline.  Respiratory: Normal inspiratory effort Cardiovascular: Normal rate, no murmurs, rubs, gallops GI: nondistended, nontender. No appreciable splenomegaly skin: No rash, skin intact Lymph: - Anterior cervical LN.  No posterior cervical lymphadenopathy Musculoskeletal: no deformities Neurologic: Alert & oriented x 3, no focal neuro deficits Psychiatric: Speech and behavior appropriate.   ED Course   Medications - No data to display  Orders Placed This Encounter  Procedures  . Group A Strep by PCR    Standing Status:   Standing    Number of Occurrences:   1    Order Specific Question:   Patient immune status    Answer:   Normal  . SARS CORONAVIRUS 2 (TAT 6-24 HRS) Nasopharyngeal Nasopharyngeal Swab    Standing Status:   Standing    Number of Occurrences:   1    Order Specific Question:   Is this test for diagnosis or screening     Answer:   Diagnosis of ill patient    Order Specific Question:   Symptomatic for COVID-19 as defined by CDC    Answer:   Yes    Order Specific Question:   Date of Symptom Onset    Answer:   07/24/2019    Order Specific Question:   Hospitalized for COVID-19    Answer:   No    Order Specific Question:   Admitted to ICU for COVID-19    Answer:   No    Order Specific Question:   Previously tested for COVID-19    Answer:   Yes    Order Specific Question:   Resident in a congregate (group) care setting    Answer:   No    Order Specific Question:   Employed in healthcare setting    Answer:   No    Order Specific Question:   Pregnant    Answer:   No    Results for orders placed or performed during the hospital encounter of 07/25/19 (from the past 24 hour(s))  Group A Strep by PCR     Status: None   Collection Time: 07/25/19  1:23 PM   Specimen: Throat; Sterile Swab  Result Value Ref Range   Group A Strep by PCR NOT DETECTED NOT DETECTED   No results found.  ED Clinical Impression  1. Pharyngitis, unspecified etiology      ED Assessment/Plan  Strep, Covid PCR sent.  Strep PCR negative. Presentation consistent with a viral pharyngitis. Patient home with ibuprofen, Tylenol, Benadryl/Maalox mixture, Zofran for the nausea, 2-day school/ work note.. Patient to followup with PMD when necessary  Discussed labs,  MDM, plan and followup with patient. Discussed sn/sx that should prompt return to the ED. patient agrees with plan.   Meds ordered this encounter  Medications  . ibuprofen (ADVIL) 600 MG tablet    Sig: Take 1 tablet (600 mg total) by mouth every 6 (six) hours as needed.    Dispense:  30 tablet    Refill:  0  . ondansetron (ZOFRAN ODT) 8 MG disintegrating tablet    Sig: 1/2- 1 tablet q 8 hr prn nausea, vomiting    Dispense:  20 tablet    Refill:  0     *This clinic note was created using Lobbyist. Therefore, there may be occasional mistakes despite careful  proofreading.    Melynda Ripple, MD 07/25/19 1423

## 2019-07-25 NOTE — Discharge Instructions (Addendum)
your strep PCR was negative.  Your Covid PCR will be back in 6 to 24 hours . 1 gram of Tylenol and 600 mg ibuprofen together 3-4 times a day as needed for pain.  Make sure you drink plenty of extra fluids.  Some people find salt water gargles and  Traditional Medicinal's "Throat Coat" tea helpful. Take 5 mL of liquid Benadryl and 5 mL of Maalox. Mix it together, and then hold it in your mouth for as long as you can and then swallow. You may do this 4 times a day.  Zofran as needed for nausea.  It may make you constipated.  Go to www.goodrx.com to look up your medications. This will give you a list of where you can find your prescriptions at the most affordable prices. Or ask the pharmacist what the cash price is, or if they have any other discount programs available to help make your medication more affordable. This can be less expensive than what you would pay with insurance.

## 2019-07-25 NOTE — ED Triage Notes (Signed)
Patient c/o sore throat and nausea that started yesterday. Patient had COVID in December of 2020.

## 2019-07-26 LAB — SARS CORONAVIRUS 2 (TAT 6-24 HRS): SARS Coronavirus 2: NEGATIVE

## 2019-10-20 IMAGING — CR DG SHOULDER 2+V*L*
3 series · 3 of 3 positions shown · non-contrast
Comparison: None.

CLINICAL DATA: Left shoulder pain due to an injury when the patient
fell 2 days ago. Initial encounter.

EXAM:
LEFT SHOULDER - 2+ VIEW

[shoulder grashey]
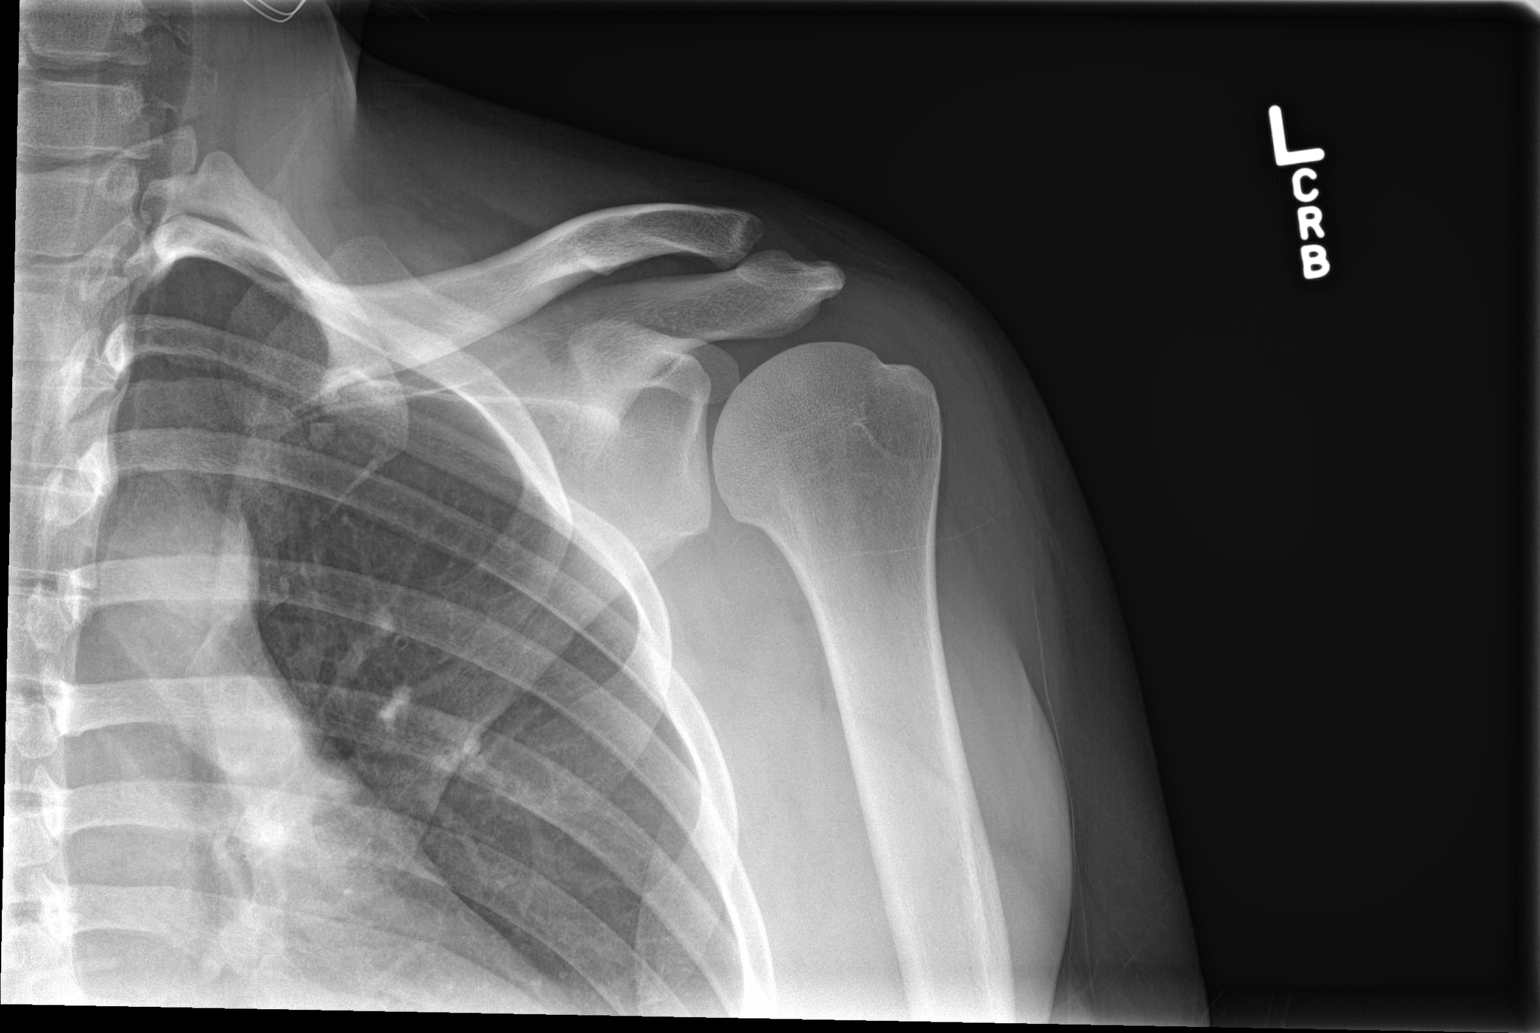

[shoulder y view]
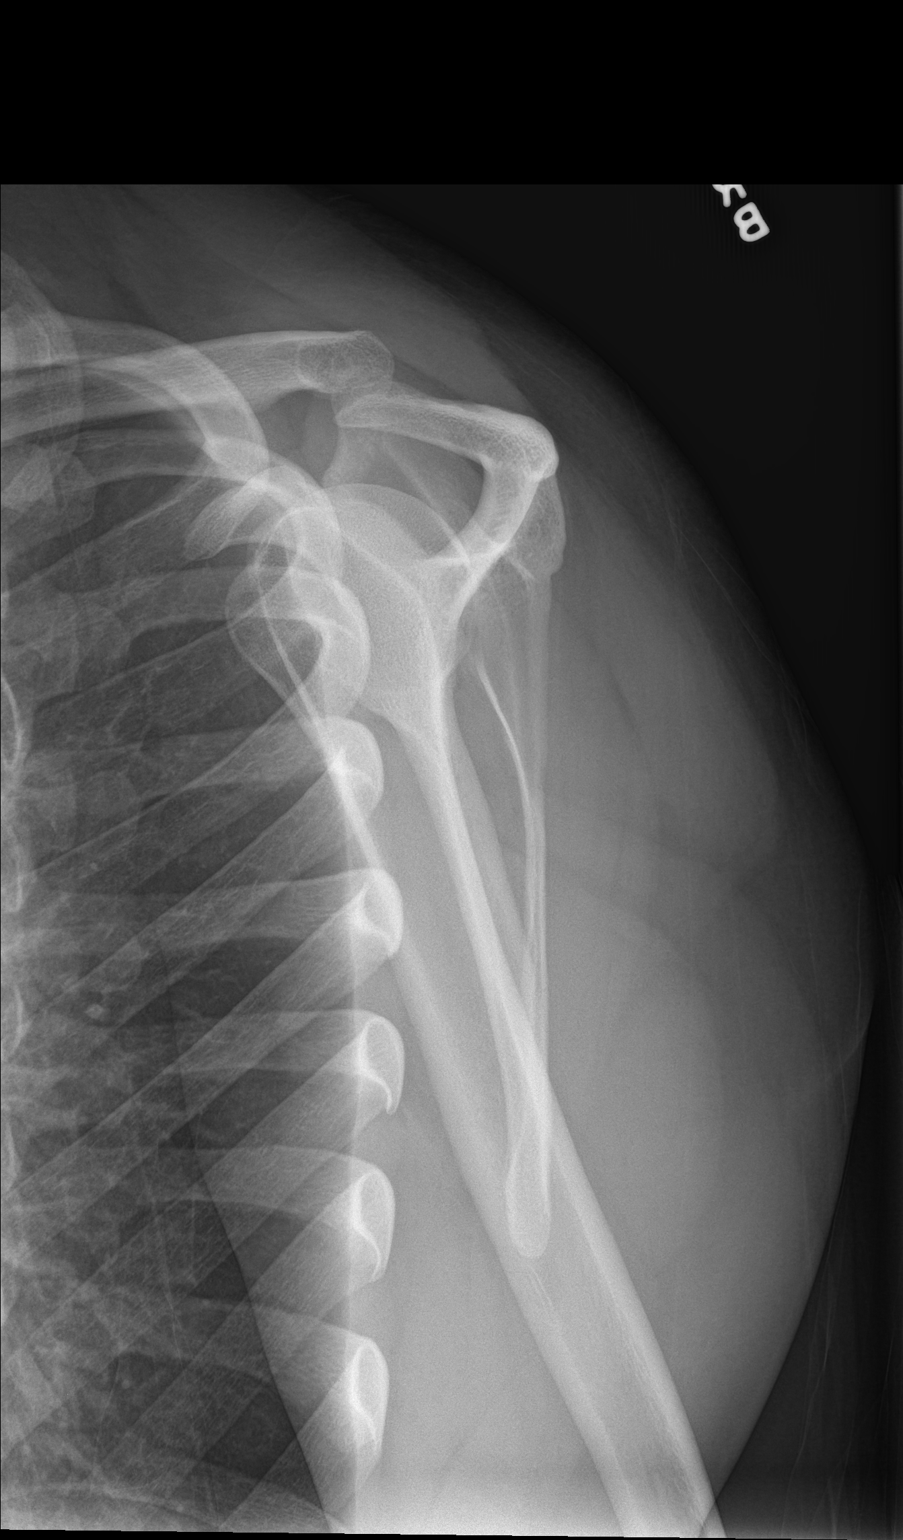

[shoulder axial]
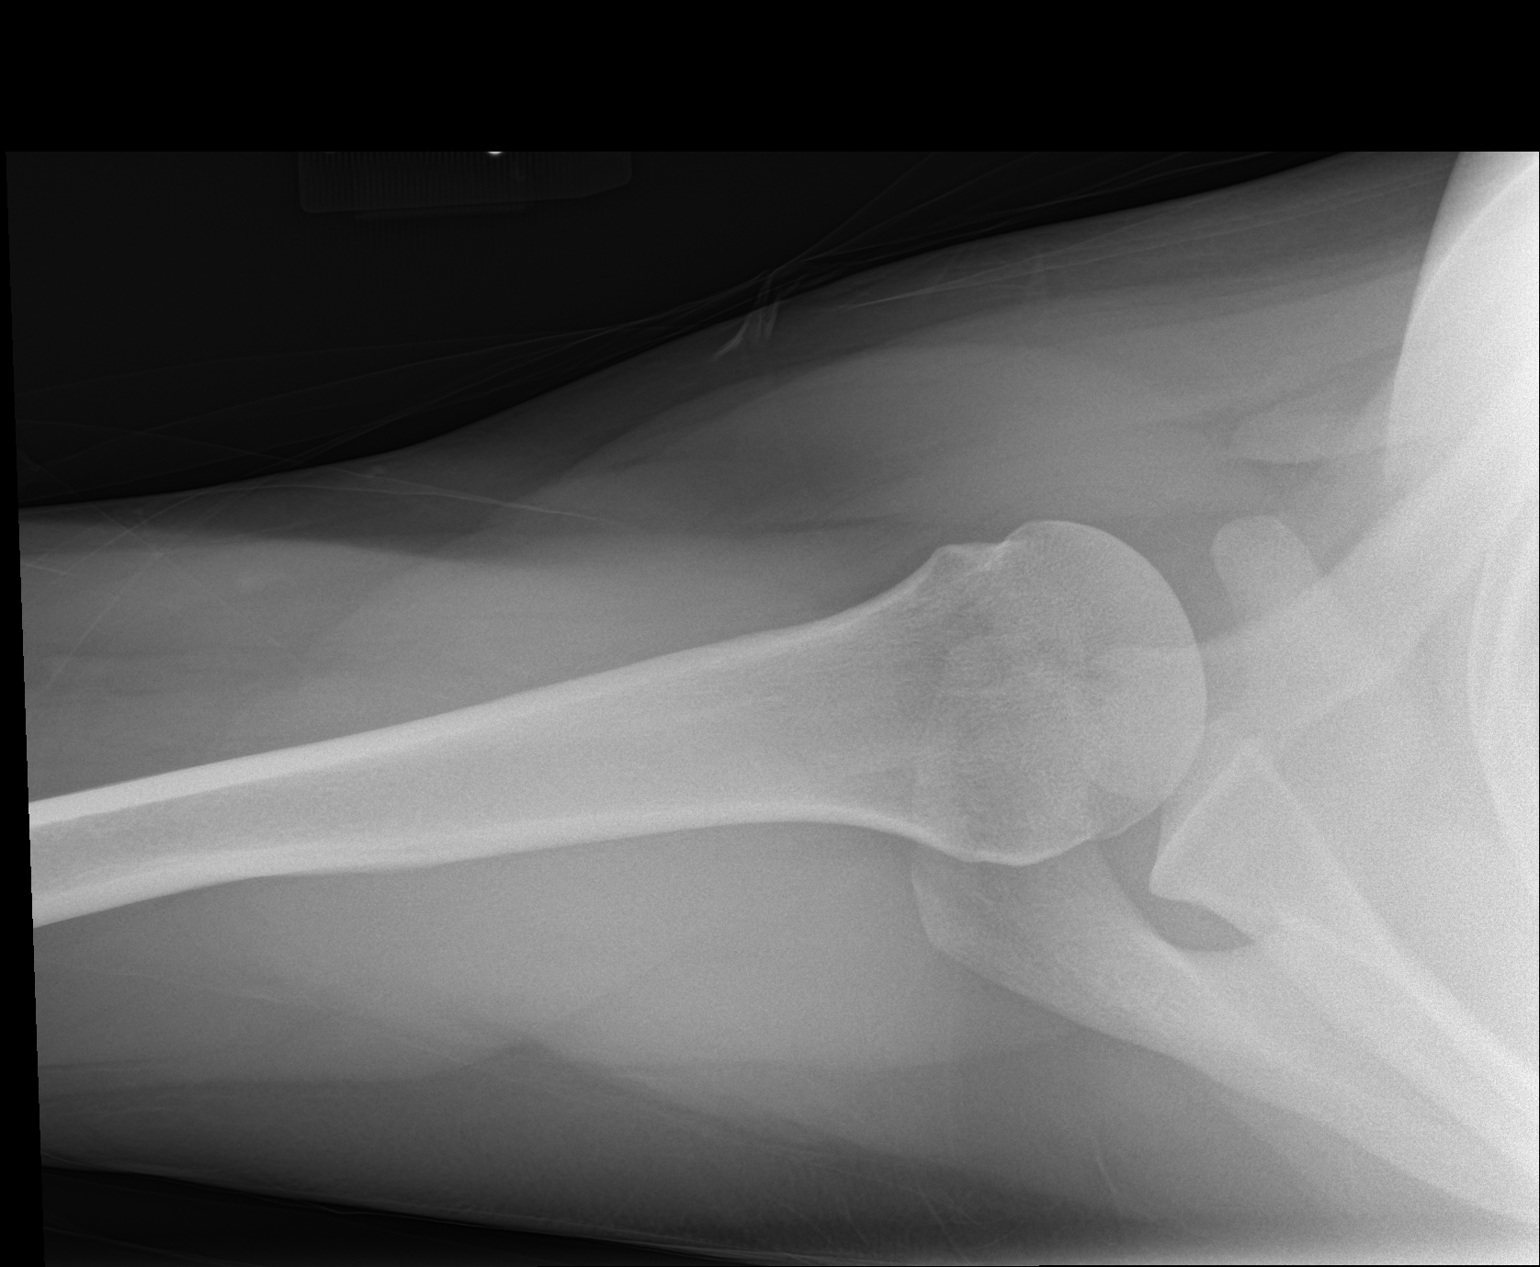

[3 of 3 positions shown; findings below may reference images not displayed]

FINDINGS: There is no evidence of fracture or dislocation. There is no
evidence of arthropathy or other focal bone abnormality. Soft
tissues are unremarkable.
IMPRESSION: Normal exam.

## 2019-11-15 DIAGNOSIS — Z419 Encounter for procedure for purposes other than remedying health state, unspecified: Secondary | ICD-10-CM | POA: Diagnosis not present

## 2019-12-16 DIAGNOSIS — Z419 Encounter for procedure for purposes other than remedying health state, unspecified: Secondary | ICD-10-CM | POA: Diagnosis not present

## 2020-01-16 DIAGNOSIS — Z419 Encounter for procedure for purposes other than remedying health state, unspecified: Secondary | ICD-10-CM | POA: Diagnosis not present

## 2020-01-29 ENCOUNTER — Ambulatory Visit: Payer: Medicaid Other

## 2020-01-30 ENCOUNTER — Encounter: Payer: Medicaid Other | Admitting: Advanced Practice Midwife

## 2020-01-30 ENCOUNTER — Encounter: Payer: Self-pay | Admitting: Advanced Practice Midwife

## 2020-01-30 ENCOUNTER — Ambulatory Visit (LOCAL_COMMUNITY_HEALTH_CENTER): Payer: Medicaid Other | Admitting: Advanced Practice Midwife

## 2020-01-30 ENCOUNTER — Ambulatory Visit: Payer: Medicaid Other | Admitting: Advanced Practice Midwife

## 2020-01-30 ENCOUNTER — Other Ambulatory Visit: Payer: Self-pay

## 2020-01-30 VITALS — BP 112/80 | Ht 66.0 in | Wt 178.8 lb

## 2020-01-30 DIAGNOSIS — E663 Overweight: Secondary | ICD-10-CM

## 2020-01-30 DIAGNOSIS — F1011 Alcohol abuse, in remission: Secondary | ICD-10-CM

## 2020-01-30 DIAGNOSIS — Z30017 Encounter for initial prescription of implantable subdermal contraceptive: Secondary | ICD-10-CM

## 2020-01-30 DIAGNOSIS — F339 Major depressive disorder, recurrent, unspecified: Secondary | ICD-10-CM

## 2020-01-30 DIAGNOSIS — Z86711 Personal history of pulmonary embolism: Secondary | ICD-10-CM | POA: Diagnosis not present

## 2020-01-30 DIAGNOSIS — Z3009 Encounter for other general counseling and advice on contraception: Secondary | ICD-10-CM

## 2020-01-30 HISTORY — DX: Alcohol abuse, in remission: F10.11

## 2020-01-30 LAB — WET PREP FOR TRICH, YEAST, CLUE
Trichomonas Exam: NEGATIVE
Yeast Exam: NEGATIVE

## 2020-01-30 LAB — PREGNANCY, URINE: Preg Test, Ur: NEGATIVE

## 2020-01-30 MED ORDER — METRONIDAZOLE 500 MG PO TABS: 500.0000 mg | ORAL_TABLET | Freq: Two times a day (BID) | ORAL | 0 refills | Status: AC

## 2020-01-30 MED ORDER — ETONOGESTREL 68 MG ~~LOC~~ IMPL
68.0000 mg | DRUG_IMPLANT | Freq: Once | SUBCUTANEOUS | Status: AC
  Administered 2020-01-30: 68 mg via SUBCUTANEOUS

## 2020-01-30 MED ORDER — LEVONORGESTREL 1.5 MG PO TABS: 1.5000 mg | ORAL_TABLET | Freq: Once | ORAL | 0 refills | Status: AC

## 2020-01-30 NOTE — Progress Notes (Signed)
[employer] results reviewed. Patient treated for BV per standing orders. Hal Morales, RN

## 2020-01-30 NOTE — Progress Notes (Signed)
[facility] Number: [phone]    Family Planning Visit- Initial Visit  Subjective:  Kaitlyn Turner is a 34 y.o. DWF G8P3 (37, 61, 6) exsmoker  being seen today for an initial well woman visit and to discuss family planning options.  She is currently using Nexplanon for pregnancy prevention. Patient reports she does not want a pregnancy in the next year.  Patient has the following medical conditions has Suicidal ideation; MDD (major depressive disorder), recurrent episode ([place]); Family disruption due to divorce or legal separation; Adjustment disorder with mixed anxiety and depressed mood; Family history of alcoholism; Chest pain; Overweight BMI=28.8; and H/O alcohol abuse--DUI 2019 on their problem list.  Chief Complaint  Patient presents with  . Annual Exam  . Contraception    Nexplanon  . Procedure    Nexplanon removal/reinsertion    Patient reports exsmoker with last use age 26.  Hx DUI 2019 with last ETOH 09/2019 (3 glasses wine) last time; currently wearing ankle monitor x 90 days for DUI, lost drivers license and needs to complete a class.  LMP 01/01/20.  Last sex yesterday with condom; off and on relationship past 6 mo; 1 partner in last 3 mon.  Employed 20 hrs/wk and in school PT at [employer].  Living with her 3 kids.  Hx pulmonary embolism 12 years ago with no meds past 10 years.  Nexplanon inserted 07/21/16 and wants removal/reinsertion today.  Last pap 12/17/16 neg.  ASCUS HPV neg 05/19/2012.  Last PE 03/02/18.  Patient denies vaping, MJ, cigars   Body mass index is 28.86 kg/m. - Patient is eligible for diabetes screening based on BMI and age >29?  not applicable JJ8A ordered? not applicable  Patient reports 1 of partners in last year. Desires STI screening?  Yes  Has patient been screened once for HCV in the past?  No  No results found for: HCVAB  Does the patient have current drug use  (including MJ), have a partner with drug use, and/or has been incarcerated since last result? Yes  If yes-- Screen for HCV through [facility]   Does the patient meet criteria for HBV testing? Yes  Criteria:  -Household, sexual or needle sharing contact with HBV -History of drug use -HIV positive -Those with known Hep C   Health Maintenance Due  Topic Date Due  . Hepatitis C Screening  Never done  . PAP SMEAR-Modifier  Never done  . INFLUENZA VACCINE  Never done    Review of Systems  Neurological: Positive for headaches (2x/wk relieved with exercise always temporal/frontal, -N&V, -audio, -vision).  All other systems reviewed and are negative.   The following portions of the patient's history were reviewed and updated as appropriate: allergies, current medications, past family history, past medical history, past social history, past surgical history and problem list. Problem list updated.   See flowsheet for other program required questions.  Objective:   Vitals:   01/30/20 0847  BP: 112/80  Weight: 178 lb 12.8 oz (81.1 kg)  Height: 5\' 6"  (1.676 m)    Physical Exam Constitutional:      Appearance: Normal appearance. She is normal weight.  HENT:     Head: Normocephalic and atraumatic.     Mouth/Throat:     Mouth: Mucous membranes are moist.  Eyes:     Conjunctiva/sclera: Conjunctivae normal.  Cardiovascular:     Rate and Rhythm: Normal rate and regular rhythm.  Pulmonary:  Effort: Pulmonary effort is normal.     Breath sounds: Normal breath sounds.  Chest:     Breasts:        Right: Normal.        Left: Normal.  Abdominal:     Palpations: Abdomen is soft.  Genitourinary:    General: Normal vulva.     Exam position: Lithotomy position.     Vagina: Vaginal discharge (light brown frothy malodorous leukorrhea, ph>4.5) present.     Cervix: Normal.     Uterus: Normal.      Adnexa: Right adnexa normal and left adnexa normal.     Rectum: Normal.      Comments: Pap done Musculoskeletal:        General: Normal range of motion.     Cervical back: Normal range of motion and neck supple.  Skin:    General: Skin is warm and dry.  Neurological:     Mental Status: She is alert.  Psychiatric:        Mood and Affect: Mood normal.       Assessment and Plan:  Kaitlyn Turner is a 34 y.o. female presenting to the [facility] Department for an initial well woman exam/family planning visit  Contraception counseling: Reviewed all forms of birth control options in the tiered based approach. available including abstinence; over the counter/barrier methods; hormonal contraceptive medication including pill, patch, ring, injection,contraceptive implant, ECP; hormonal and nonhormonal IUDs; permanent sterilization options including vasectomy and the various tubal sterilization modalities. Risks, benefits, and typical effectiveness rates were reviewed.  Questions were answered.  Written information was also given to the patient to review.  Patient desires Nexplanon removal/reinsertion, this was prescribed for patient. She will follow up in 1 year for surveillance.  She was told to call with any further questions, or with any concerns about this method of contraception.  Emphasized use of condoms 100% of the time for STI prevention.  Patient was offered ECP. ECP was accepted by the patient. ECP counseling was given - see RN documentation  1. Overweight BMI=28.8   2. Family planning PT today. Pt counseled about small risk of pregnancy with expired Nexplanon and coitus last night with condom--pt desires ECP and Nexplanon removal/reinsertion today despite small risk of pregnancy Please counsel on need for abstinance/back up condoms next 7 days and need for repeat PT 02/12/20 and to call if + Treat wet mount per standing orders Immunization nurse consult  3. Encounter for initial prescription of implantable subdermal contraceptive Nexplanon Removal  and Insertion  Patient identified, informed consent performed, consent signed.   Patient does understand that irregular bleeding is a very common side effect of this medication. She was advised to have backup contraception for one week after replacement of the implant. Patient deemed to meet WHO criteria for being reasonably certain she is not pregnant.  Appropriate time out taken. Nexplanon site identified. Area prepped in usual sterile fashon. 3 ml of 1% lidocaine with epinephrine was used to anesthetize the area at the distal end of the implant. A small stab incision was made right beside the implant on the distal portion. The Nexplanon rod was grasped using hemostats and removed without difficulty. There was minimal blood loss. There were no complications.   Confirmed correct location of insertion site. The insertion site was identified 8-10 cm (3-4 inches) from the medial epicondyle of the humerus and 3-5 cm (1.25-2 inches) posterior to (below) the sulcus (groove) between the biceps and triceps muscles of  the patient's right arm. New Nexplanon removed from packaging, Device confirmed in needle, then inserted full length of needle and withdrawn per handbook instructions. Nexplanon was able to palpated in the patient's right arm; patient palpated the insert herself.  There was minimal blood loss. Patient insertion site covered with guaze and a pressure bandage to reduce any bruising. The patient tolerated the procedure well and was given post procedure instructions.   Nexplanon:   Counseled patient to take OTC analgesic starting as soon as lidocaine starts to wear off and take regularly for at least 48 hr to decrease discomfort.  Specifically to take with food or milk to decrease stomach upset and for IB 600 mg (3 tablets) every 6 hrs; IB 800 mg (4 tablets) every 8 hrs; or Aleve 2 tablets every 12 hrs.    4. H/O alcohol abuse--DUI 2019      No follow-ups on file.  No future  appointments.  Herbie Saxon, CNM

## 2020-01-30 NOTE — Progress Notes (Signed)
See FP chart for documentation. Patient requesting a FP OV instead of just STI screening. Hal Morales, RN

## 2020-01-30 NOTE — Progress Notes (Signed)
Patient originally scheduled for FP appt. Per patient she is only here for STD screening. Then patient opts for FP appt for PE and Nexplnon removal/reinsertion. See FP chart for documentation. Hal Morales, RN

## 2020-01-30 NOTE — Progress Notes (Signed)
Here today for PE, STD screen and Nexplanon removal/reinsertion. Last PE here was 03/02/2018, last Pap Smear was 12/17/2016. Nexplanon inserted here 07/21/2016. Wants all STD screening. Hal Morales, RN

## 2020-02-04 ENCOUNTER — Encounter: Payer: Self-pay | Admitting: Family Medicine

## 2020-02-04 ENCOUNTER — Encounter: Payer: Self-pay | Admitting: Advanced Practice Midwife

## 2020-02-04 LAB — IGP, APTIMA HPV
HPV Aptima: NEGATIVE
PAP Smear Comment: 0

## 2020-02-04 LAB — HEPATITIS B SURFACE ANTIGEN

## 2020-02-04 LAB — HM HEPATITIS C SCREENING LAB: HM Hepatitis Screen: NEGATIVE

## 2020-02-04 LAB — HM HIV SCREENING LAB: HM HIV Screening: NEGATIVE

## 2020-02-06 ENCOUNTER — Telehealth: Payer: Self-pay

## 2020-02-06 DIAGNOSIS — A749 Chlamydial infection, unspecified: Secondary | ICD-10-CM

## 2020-02-06 NOTE — Telephone Encounter (Signed)
TC to patient. Verified ID via password/SS#. Informed of positive chlamydia and need for tx. Instructed to eat before visit and have partner call for tx appt. Appt scheduled.Teiara Baria, RN    

## 2020-02-06 NOTE — Telephone Encounter (Signed)
Phone call to pt. Pt states she did receive the reply message in My Chart. Pt understands she would be due for another pap in one year.

## 2020-02-06 NOTE — Progress Notes (Unsigned)
MyChart message sent to patient today regarding her PAP results.  PAP showed LSIL and HPV was Negative.  Repeat PAP in 1 year (due 01/2021) per Donnal Moat, CNM.  Routed 9/20/2021per Donnal Moat, CNM via phone message and staff message in Epic by Doy Mince, RN.  Dahlia Bailiff, RN

## 2020-02-06 NOTE — Telephone Encounter (Signed)
Return call to patient regarding her PAP results.  Left a message to please return my call. Dahlia Bailiff, RN

## 2020-02-08 ENCOUNTER — Other Ambulatory Visit: Payer: Self-pay

## 2020-02-08 ENCOUNTER — Ambulatory Visit: Payer: Medicaid Other

## 2020-02-08 DIAGNOSIS — A749 Chlamydial infection, unspecified: Secondary | ICD-10-CM

## 2020-02-08 MED ORDER — AZITHROMYCIN 500 MG PO TABS
1000.0000 mg | ORAL_TABLET | Freq: Once | ORAL | Status: AC
  Administered 2020-02-08: 1000 mg via ORAL

## 2020-02-15 DIAGNOSIS — Z419 Encounter for procedure for purposes other than remedying health state, unspecified: Secondary | ICD-10-CM | POA: Diagnosis not present

## 2020-03-17 DIAGNOSIS — Z419 Encounter for procedure for purposes other than remedying health state, unspecified: Secondary | ICD-10-CM | POA: Diagnosis not present

## 2020-03-28 ENCOUNTER — Telehealth: Payer: Self-pay | Admitting: Family Medicine

## 2020-03-28 NOTE — Telephone Encounter (Signed)
Patient wants to talk to nurse about [facility]. Patient has Nexplanon and has questions before removing.

## 2020-03-28 NOTE — Telephone Encounter (Signed)
Call to patient. Patient is planning to try to get pregnancy next year and wanted advice of when she should take nexplanon out. RN provided preconception counseling and patient is going to decide to get nexplanon removed in Jan 2022.   Deri Fuelling, RN

## 2020-04-16 DIAGNOSIS — Z419 Encounter for procedure for purposes other than remedying health state, unspecified: Secondary | ICD-10-CM | POA: Diagnosis not present

## 2020-05-17 DIAGNOSIS — Z419 Encounter for procedure for purposes other than remedying health state, unspecified: Secondary | ICD-10-CM | POA: Diagnosis not present

## 2020-05-19 ENCOUNTER — Encounter: Payer: Medicaid Other | Admitting: Advanced Practice Midwife

## 2020-05-30 ENCOUNTER — Encounter: Payer: Self-pay | Admitting: Emergency Medicine

## 2020-05-30 ENCOUNTER — Ambulatory Visit
Admission: EM | Admit: 2020-05-30 | Discharge: 2020-05-30 | Disposition: A | Discharge status: Home / Self Care | Payer: Managed Care, Other (non HMO) | Attending: Family Medicine | Admitting: Family Medicine

## 2020-05-30 ENCOUNTER — Other Ambulatory Visit: Payer: Self-pay

## 2020-05-30 DIAGNOSIS — M62838 Other muscle spasm: Secondary | ICD-10-CM | POA: Diagnosis not present

## 2020-05-30 MED ORDER — TIZANIDINE HCL 4 MG PO TABS: 4.0000 mg | ORAL_TABLET | Freq: Three times a day (TID) | ORAL | 0 refills | Status: DC | PRN

## 2020-05-30 NOTE — ED Provider Notes (Signed)
[facility]    CSN: [id number] Arrival date & time: 05/30/20  7628      History   Chief Complaint Chief Complaint  Patient presents with  . Cyst   HPI 35 year old female presents with an area of concern - posterior neck.  Started yesterday. Decreased ROM of the neck with pain. Patient reports she palpated an area posteriorly and felt like it was a knot. Pain 2/10 in severity. No relieving factors. No other reported symptoms. No other complaints.  Past Medical History:  Diagnosis Date  . Anemia   . Family history of alcoholism 01/08/2015   Per [employer]  . Hypotension   . Malignant hyperthermia   . Mental disorder    Panic attacks  . Pulmonary embolism ([place])   . Vaginal Pap smear, abnormal     Patient Active Problem List   Diagnosis Date Noted  . Overweight BMI=28.8 01/30/2020  . H/O alcohol abuse--DUI 2019 01/30/2020  . History of pulmonary embolism 01/30/2020  . Chest pain 01/06/2019  . MDD (major depressive disorder), recurrent episode ([place]) 08/21/2018  . Suicidal ideation 08/20/2018  . Family disruption due to divorce or legal separation 06/03/2015  . Adjustment disorder with mixed anxiety and depressed mood 06/03/2015  . Family history of alcoholism 01/08/2015    Past Surgical History:  Procedure Laterality Date  . DENTAL SURGERY      OB History    Gravida  8   Para  3   Term  3   Preterm  0   AB  5   Living  3     SAB  3   IAB  2   Ectopic  0   Multiple  0   Live Births  3            Home Medications    Prior to Admission medications   Medication Sig Start Date End Date Taking? Authorizing Provider  tiZANidine (ZANAFLEX) 4 MG tablet Take 1 tablet (4 mg total) by mouth every 8 (eight) hours as needed for muscle spasms. 05/30/20  Yes Tracye Szuch G, DO  COLCRYS 0.6 MG tablet Take 1 tablet (0.6 mg total) by mouth daily. Patient not taking: Reported on 01/30/2020 06/26/19 05/30/20  Kate Sable, MD  pantoprazole  (PROTONIX) 40 MG tablet Take 1 tablet (40 mg total) by mouth daily. For 1 month. Patient not taking: Reported on 01/30/2020 06/21/19 05/30/20  Kate Sable, MD  traZODone (DESYREL) 50 MG tablet Take 1 tablet (50 mg total) by mouth at bedtime and may repeat dose one time if needed. For sleep 08/23/18 01/06/19  Connye Burkitt, NP    Family History Family History  Problem Relation Age of Onset  . Cancer Mother        Unsure  . Hypertension Father   . Hypertension Maternal Grandmother   . Heart disease Maternal Grandmother     Social History Social History   Tobacco Use  . Smoking status: Never Smoker  . Smokeless tobacco: Never Used  Vaping Use  . Vaping Use: Never used  Substance Use Topics  . Alcohol use: Not Currently    Comment: occasionally  . Drug use: No     Allergies   Stadol [butorphanol], Keflex [cephalexin], Other, Pork-derived products, and Toradol [ketorolac tromethamine]   Review of Systems Review of Systems  Constitutional: Negative.   Musculoskeletal: Positive for neck pain.   Physical Exam Triage Vital Signs ED Triage Vitals  Enc Vitals Group  BP 05/30/20 1022 106/75     Pulse Rate 05/30/20 1022 63     Resp 05/30/20 1022 18     Temp 05/30/20 1022 99.1 F (37.3 C)     Temp Source 05/30/20 1022 Oral     SpO2 05/30/20 1022 100 %     Weight 05/30/20 1021 178 lb 12.7 oz (81.1 kg)     Height 05/30/20 1021 5\' 6"  (1.676 m)     Head Circumference --      Peak Flow --      Pain Score 05/30/20 1021 2     Pain Loc --      Pain Edu? --      Excl. in [place]? --    Updated Vital Signs BP 106/75 (BP Location: Right Arm)   Pulse 63   Temp 99.1 F (37.3 C) (Oral)   Resp 18   Ht 5\' 6"  (1.676 m)   Wt 81.1 kg   SpO2 100%   BMI 28.86 kg/m   Visual Acuity Right Eye Distance:   Left Eye Distance:   Bilateral Distance:    Right Eye Near:   Left Eye Near:    Bilateral Near:     Physical Exam Vitals and nursing note reviewed.  Constitutional:       General: She is not in acute distress.    Appearance: Normal appearance. She is not ill-appearing.  HENT:     Head: Normocephalic and atraumatic.  Neck:     Comments: Posterior neck tenderness and spasm noted.  Pulmonary:     Effort: Pulmonary effort is normal. No respiratory distress.  Neurological:     Mental Status: She is alert.  Psychiatric:        Mood and Affect: Mood normal.        Behavior: Behavior normal.    UC Treatments / Results  Labs (all labs ordered are listed, but only abnormal results are displayed) Labs Reviewed - No data to display  EKG   Radiology No results found.  Procedures Procedures (including critical care time)  Medications Ordered in UC Medications - No data to display  Initial Impression / Assessment and Plan / UC Course  I have reviewed the triage vital signs and the nursing notes.  Pertinent labs & imaging results that were available during my care of the patient were reviewed by me and considered in my medical decision making (see chart for details).    35 year old female presents with neck muscle spasm.  The area of concern appears to be consistent with muscle spasm.  Treating with Zanaflex.  Advised heat.  Supportive care.  Final Clinical Impressions(s) / UC Diagnoses   Final diagnoses:  Muscle spasms of neck     Discharge Instructions     Heat/warm compress.  Medication as prescribed.  Take care  Dr. Lacinda Axon    ED Prescriptions    Medication Sig Dispense Auth. Provider   tiZANidine (ZANAFLEX) 4 MG tablet Take 1 tablet (4 mg total) by mouth every 8 (eight) hours as needed for muscle spasms. 30 tablet Coral Spikes, DO     PDMP not reviewed this encounter.   Coral Spikes, [place] 05/30/20 1209

## 2020-05-30 NOTE — Discharge Instructions (Signed)
Heat/warm compress.  Medication as prescribed.  Take care  Dr. Lacinda Axon

## 2020-05-30 NOTE — ED Triage Notes (Signed)
Patient c/o knot on the back of her neck that started yesterday.

## 2020-06-06 ENCOUNTER — Ambulatory Visit: Payer: Medicaid Other

## 2020-06-09 ENCOUNTER — Other Ambulatory Visit: Payer: Self-pay

## 2020-06-09 ENCOUNTER — Ambulatory Visit (LOCAL_COMMUNITY_HEALTH_CENTER): Payer: Medicaid Other | Admitting: Physician Assistant

## 2020-06-09 VITALS — BP 129/69 | Ht 66.0 in | Wt 178.0 lb

## 2020-06-09 DIAGNOSIS — Z3161 Procreative counseling and advice using natural family planning: Secondary | ICD-10-CM

## 2020-06-09 DIAGNOSIS — Z3046 Encounter for surveillance of implantable subdermal contraceptive: Secondary | ICD-10-CM | POA: Diagnosis not present

## 2020-06-09 DIAGNOSIS — Z3009 Encounter for other general counseling and advice on contraception: Secondary | ICD-10-CM

## 2020-06-09 NOTE — Progress Notes (Signed)
Patient here for nexplanon removal. Wants to get pregnant, preconception counseling. Nexplanon insertion and last physical 01/30/2020.   Deri Fuelling, RN

## 2020-06-09 NOTE — Progress Notes (Signed)
`    [place] problem visit  [facility] Department  Subjective:  Kaitlyn Turner is a 35 y.o. being seen today for Nexplanon removal.  Chief Complaint  Patient presents with  . Contraception    Nexplanon removal/ review preconception planning.    HPI Patient states that she is here for a Nexplanon removal and desires pregnancy.  Denies problems with the Nexplanon.  Does the patient have a current or past history of drug use? No   No components found for: HCV]   Health Maintenance Due  Topic Date Due  . INFLUENZA VACCINE  Never done    ROS  The following portions of the patient's history were reviewed and updated as appropriate: allergies, current medications, past family history, past medical history, past social history, past surgical history and problem list. Problem list updated.   See flowsheet for other program required questions.  Objective:   Vitals:   06/09/20 1105  BP: 129/69  Weight: 178 lb (80.7 kg)  Height: 5\' 6"  (1.676 m)    Physical Exam    Assessment and Plan:  Kaitlyn Turner is a 35 y.o. female presenting to the [facility] Department for a Women's Health problem visit  1. Encounter for counseling regarding contraception Counseled patient that it can take 3-4 weeks before she has a period but it could also happen within a week. Enc condoms with all sex until ready to start trying to achieve pregnancy.  2. Nexplanon removal Nexplanon Removal Patient identified, informed consent performed, consent signed.   Appropriate time out taken. Nexplanon site identified.  Area prepped in usual sterile fashon. 2 ml of 1% lidocaine with Epinephrine was used to anesthetize the area at the distal end of the implant and along implant site. A small stab incision was made right beside the implant on the distal portion.  The Nexplanon rod was grasped manually and removed without difficulty.  There was minimal blood loss. There  were no complications.  Steri-strips were applied over the small incision.  A pressure bandage was applied to reduce any bruising.  The patient tolerated the procedure well and was given post procedure instructions.   Nexplanon:   Counseled patient to take OTC analgesic starting as soon as lidocaine starts to wear off and take regularly for at least 48 hr to decrease discomfort.  Specifically to take with food or milk to decrease stomach upset and for IB 600 mg (3 tablets) every 6 hrs; IB 800 mg (4 tablets) every 8 hrs; or Aleve 2 tablets every 12 hrs.    3. Counseling about natural family planning Reviewed with patient steps to track cycle and when ovulation occurs. Reviewed with patient healthy habits for self and partner for healthy pregnancy. Enc to avoid tobacco, EtOH,other drugs and excessive caffeine intake.     Return in 8 months (on 02/07/2021) for RP and prn.  No future appointments.  Jerene Dilling, PA

## 2020-06-17 DIAGNOSIS — Z419 Encounter for procedure for purposes other than remedying health state, unspecified: Secondary | ICD-10-CM | POA: Diagnosis not present

## 2020-06-23 ENCOUNTER — Ambulatory Visit (INDEPENDENT_AMBULATORY_CARE_PROVIDER_SITE_OTHER)
Admit: 2020-06-23 | Discharge: 2020-06-23 | Disposition: A | Discharge status: Home / Self Care | Payer: Medicaid Other | Attending: Emergency Medicine | Admitting: Emergency Medicine

## 2020-06-23 ENCOUNTER — Ambulatory Visit (INDEPENDENT_AMBULATORY_CARE_PROVIDER_SITE_OTHER): Payer: Medicaid Other

## 2020-06-23 ENCOUNTER — Ambulatory Visit
Admission: EM | Admit: 2020-06-23 | Discharge: 2020-06-23 | Disposition: A | Discharge status: Home / Self Care | Payer: Medicaid Other | Attending: Family Medicine | Admitting: Family Medicine

## 2020-06-23 ENCOUNTER — Other Ambulatory Visit: Payer: Self-pay

## 2020-06-23 DIAGNOSIS — K5900 Constipation, unspecified: Secondary | ICD-10-CM | POA: Diagnosis not present

## 2020-06-23 DIAGNOSIS — M545 Low back pain, unspecified: Secondary | ICD-10-CM | POA: Insufficient documentation

## 2020-06-23 DIAGNOSIS — R102 Pelvic and perineal pain: Secondary | ICD-10-CM | POA: Diagnosis not present

## 2020-06-23 DIAGNOSIS — R1084 Generalized abdominal pain: Secondary | ICD-10-CM | POA: Diagnosis not present

## 2020-06-23 DIAGNOSIS — N941 Unspecified dyspareunia: Secondary | ICD-10-CM

## 2020-06-23 LAB — URINALYSIS, COMPLETE (UACMP) WITH MICROSCOPIC
Bacteria, UA: NONE SEEN
Bilirubin Urine: NEGATIVE
Glucose, UA: NEGATIVE mg/dL
Ketones, ur: NEGATIVE mg/dL
Leukocytes,Ua: NEGATIVE
Nitrite: NEGATIVE
Protein, ur: NEGATIVE mg/dL
Specific Gravity, Urine: 1.015 (ref 1.005–1.030)
WBC, UA: NONE SEEN WBC/hpf (ref 0–5)
pH: 6 (ref 5.0–8.0)

## 2020-06-23 LAB — PREGNANCY, URINE: Preg Test, Ur: NEGATIVE

## 2020-06-23 MED ORDER — ONDANSETRON 8 MG PO TBDP: 8.0000 mg | ORAL_TABLET | Freq: Three times a day (TID) | ORAL | 0 refills | Status: DC | PRN

## 2020-06-23 NOTE — Discharge Instructions (Addendum)
Your ultrasound and lab work today did not reveal any causes of your lower abdomen and low back pain.  Take ibuprofen, 600 mg every 6 hours with food, as needed for pain.  Take Zofran every 8 hours as needed for nausea.  Make an appointment in follow-up with your OB/GYN to discuss your painful intercourse.  If you develop worsening pain, fever, nausea and vomiting or you cannot keep medications down go to the ER for evaluation.

## 2020-06-23 NOTE — ED Triage Notes (Signed)
Patient states that she had her nexplanon removed on 06/06/20. States that's that she has been having back pain for 1 week and lower abdominal pain. States that she had intercourse this morning and was extremely painful. States that she was concerned for a possible infection.

## 2020-06-23 NOTE — ED Provider Notes (Signed)
[facility]    CSN: [id number] Arrival date & time: 06/23/20  1145      History   Chief Complaint Chief Complaint  Patient presents with  . Back Pain    HPI Kaitlyn Turner is a 35 y.o. female.   HPI   35 year old female here for evaluation of low back and low abdominal pain.  Patient reports that she has had low back and lower abdomen pain going on the past week.  This morning she and her husband attempted to have intercourse and she reports that it was very painful describes it as a stabbing when he achieve full penetration.  Patient has had one episode of vomiting and has had some nausea.  Patient had her Nexplanon removed on 06/06/2020, and reports that she had vaginal bleeding for 1 day approximately 2 to 3 days after the removal.  Patient denies fever, diarrhea, painful urination, urinary urgency or frequency, vaginal discharge, or vaginal itching.  Patient states that she has been experiencing some constipation but took a laxative with good results.  Past Medical History:  Diagnosis Date  . Anemia   . Family history of alcoholism 01/08/2015   Per [facility]  . Hypotension   . Malignant hyperthermia   . Mental disorder    Panic attacks  . Pulmonary embolism ([place])   . Vaginal Pap smear, abnormal     Patient Active Problem List   Diagnosis Date Noted  . Overweight BMI=28.8 01/30/2020  . H/O alcohol abuse--DUI 2019 01/30/2020  . History of pulmonary embolism 01/30/2020  . Chest pain 01/06/2019  . MDD (major depressive disorder), recurrent episode ([place]) 08/21/2018  . Suicidal ideation 08/20/2018  . Family disruption due to divorce or legal separation 06/03/2015  . Adjustment disorder with mixed anxiety and depressed mood 06/03/2015  . Family history of alcoholism 01/08/2015    Past Surgical History:  Procedure Laterality Date  . DENTAL SURGERY      OB History    Gravida  8   Para  3   Term  3   Preterm  0   AB  5   Living  3     SAB   3   IAB  2   Ectopic  0   Multiple  0   Live Births  3            Home Medications    Prior to Admission medications   Medication Sig Start Date End Date Taking? Authorizing Provider  ondansetron (ZOFRAN ODT) 8 MG disintegrating tablet Take 1 tablet (8 mg total) by mouth every 8 (eight) hours as needed for nausea or vomiting. 06/23/20  Yes Margarette Canada, NP  COLCRYS 0.6 MG tablet Take 1 tablet (0.6 mg total) by mouth daily. Patient not taking: Reported on 01/30/2020 06/26/19 05/30/20  Kate Sable, MD  pantoprazole (PROTONIX) 40 MG tablet Take 1 tablet (40 mg total) by mouth daily. For 1 month. Patient not taking: Reported on 01/30/2020 06/21/19 05/30/20  Kate Sable, MD  traZODone (DESYREL) 50 MG tablet Take 1 tablet (50 mg total) by mouth at bedtime and may repeat dose one time if needed. For sleep 08/23/18 01/06/19  Connye Burkitt, NP    Family History Family History  Problem Relation Age of Onset  . Cancer Mother        Unsure  . Hypertension Father   . Hypertension Maternal Grandmother   . Heart disease Maternal Grandmother     Social History Social History  Tobacco Use  . Smoking status: Never Smoker  . Smokeless tobacco: Never Used  Vaping Use  . Vaping Use: Never used  Substance Use Topics  . Alcohol use: Not Currently    Comment: occasionally  . Drug use: No     Allergies   Stadol [butorphanol], Keflex [cephalexin], Other, Pork-derived products, and Toradol [ketorolac tromethamine]   Review of Systems Review of Systems  Constitutional: Positive for appetite change. Negative for activity change and fever.  Gastrointestinal: Positive for abdominal pain, constipation, nausea and vomiting. Negative for diarrhea.  Genitourinary: Negative for dysuria, frequency, hematuria and urgency.  Musculoskeletal: Positive for back pain.  Skin: Negative for rash.  Hematological: Negative.   Psychiatric/Behavioral: Negative.      Physical Exam Triage Vital  Signs ED Triage Vitals  Enc Vitals Group     BP --      Pulse --      Resp 06/23/20 1204 17     Temp --      Temp Source 06/23/20 1204 Oral     SpO2 --      Weight 06/23/20 1202 177 lb (80.3 kg)     Height 06/23/20 1202 5\' 6"  (1.676 m)     Head Circumference --      Peak Flow --      Pain Score 06/23/20 1202 8     Pain Loc --      Pain Edu? --      Excl. in [place]? --    No data found.  Updated Vital Signs BP 117/72 (BP Location: Left Arm)   Pulse 66   Temp 98.3 F (36.8 C) (Oral)   Resp 17   Ht 5\' 6"  (1.676 m)   Wt 177 lb (80.3 kg)   SpO2 100%   BMI 28.57 kg/m   Visual Acuity Right Eye Distance:   Left Eye Distance:   Bilateral Distance:    Right Eye Near:   Left Eye Near:    Bilateral Near:     Physical Exam Vitals and nursing note reviewed. Exam conducted with a chaperone present Gerald Leitz, [place]).  Constitutional:      General: She is not in acute distress.    Appearance: Normal appearance.  HENT:     Head: Normocephalic and atraumatic.  Cardiovascular:     Rate and Rhythm: Normal rate and regular rhythm.     Pulses: Normal pulses.     Heart sounds: Normal heart sounds. No murmur heard. No gallop.   Pulmonary:     Effort: Pulmonary effort is normal.     Breath sounds: Normal breath sounds. No wheezing.  Abdominal:     Tenderness: There is abdominal tenderness. There is right CVA tenderness. There is no left CVA tenderness or guarding.  Genitourinary:    General: Normal vulva.     Labia:        Right: No rash, tenderness, lesion or injury.        Left: No rash, tenderness, lesion or injury.      Comments: Bimanual exam performed.  Patient has left adnexal tenderness on exam.  No adnexal tenderness on the right.  Unable to palpate either ovary.  No cervical motion tenderness. Skin:    General: Skin is warm and dry.     Capillary Refill: Capillary refill takes less than 2 seconds.     Findings: No erythema or rash.  Neurological:     General: No  focal deficit present.     Mental Status:  She is alert and oriented to person, place, and time.  Psychiatric:        Mood and Affect: Mood normal.        Behavior: Behavior normal.        Thought Content: Thought content normal.        Judgment: Judgment normal.      UC Treatments / Results  Labs (all labs ordered are listed, but only abnormal results are displayed) Labs Reviewed  URINALYSIS, COMPLETE (UACMP) WITH MICROSCOPIC - Abnormal; Notable for the following components:      Result Value   Hgb urine dipstick TRACE (*)    All other components within normal limits  PREGNANCY, URINE    EKG   Radiology DG Abdomen Acute W/Chest  Result Date: 06/23/2020 CLINICAL DATA:  Generalized abdominal pain and constipation EXAM: DG ABDOMEN ACUTE WITH 1 VIEW CHEST COMPARISON:  None. FINDINGS: Cardiac shadows within normal limits. The lungs are clear bilaterally. No bony abnormality is noted. Scattered large and small bowel gas is noted. No obstructive changes are seen. Mild retained fecal material is noted. No free air is noted. No abnormal mass or abnormal calcifications are seen. IMPRESSION: No acute abnormality noted. Electronically Signed   By: Inez Catalina M.D.   On: 06/23/2020 12:58   US PELVIC COMPLETE WITH TRANSVAGINAL  Result Date: 06/23/2020 CLINICAL DATA:  Initial evaluation for painful intercourse, bilateral adnexal pain. EXAM: TRANSABDOMINAL AND TRANSVAGINAL ULTRASOUND OF PELVIS TECHNIQUE: Both transabdominal and transvaginal ultrasound examinations of the pelvis were performed. Transabdominal technique was performed for global imaging of the pelvis including uterus, ovaries, adnexal regions, and pelvic cul-de-sac. It was necessary to proceed with endovaginal exam following the transabdominal exam to visualize the uterus, endometrium, and ovaries. COMPARISON:  None FINDINGS: Uterus Measurements: 7.8 x 4.0 x 4.9 cm = volume: 80.7 mL. No fibroids or other mass visualized. Endometrium  Thickness: 7.4 mm.  No focal abnormality visualized. Right ovary Measurements: 2.6 x 1.9 x 2.6 cm = volume: 6.5 mL. Normal appearance/no adnexal mass. Left ovary Measurements: 2.0 x 1.4 x 1.7 cm = volume: 2.4 mL. Normal appearance/no adnexal mass. Other findings No abnormal free fluid. IMPRESSION: Normal pelvic ultrasound. No acute abnormality or findings to explain patient's symptoms identified. Electronically Signed   By: Jeannine Boga M.D.   On: 06/23/2020 14:58    Procedures Procedures (including critical care time)  Medications Ordered in UC Medications - No data to display  Initial Impression / Assessment and Plan / UC Course  I have reviewed the triage vital signs and the nursing notes.  Pertinent labs & imaging results that were available during my care of the patient were reviewed by me and considered in my medical decision making (see chart for details).   Is here for evaluation of lower abdomen and low back pain.  Physical exam reveals lung sounds that are clear to auscultation all fields and normal heart sounds.  Patient's abdomen is soft, nondistended, with positive bowel sounds in all 4 quadrants.  Patient does have diffuse tenderness with increased tenderness in bilateral lower quadrants and suprapubically.  Patient also has tenderness to palpation of her sacrum on exam.  Patient is complaining of some mild right CVA tenderness on exam as well.  Will check UA, U pregnant, and three-way abdomen.  Ideology interpretation of abdominal films is that they are negative for any acute process.  Urinalysis is positive for trace hemoglobin but otherwise unremarkable.  Urine pregnancy test is negative.  Bimanual exam performed performed  with chaperone present.  Patient does have tenderness in her left adnexa only.  Will order pelvic ultrasound.  Pelvic ultrasound is normal per radiology.  Unsure of what is causing patient's pelvic pain and dyspareunia.  Will have patient  follow-up with her OB/GYN.  Final Clinical Impressions(s) / UC Diagnoses   Final diagnoses:  Dyspareunia in female  Acute low back pain without sciatica, unspecified back pain laterality     Discharge Instructions     Your ultrasound and lab work today did not reveal any causes of your lower abdomen and low back pain.  Take ibuprofen, 600 mg every 6 hours with food, as needed for pain.  Take Zofran every 8 hours as needed for nausea.  Make an appointment in follow-up with your OB/GYN to discuss your painful intercourse.  If you develop worsening pain, fever, nausea and vomiting or you cannot keep medications down go to the ER for evaluation.    ED Prescriptions    Medication Sig Dispense Auth. Provider   ondansetron (ZOFRAN ODT) 8 MG disintegrating tablet Take 1 tablet (8 mg total) by mouth every 8 (eight) hours as needed for nausea or vomiting. 20 tablet Margarette Canada, NP     PDMP not reviewed this encounter.   Margarette Canada, NP 06/23/20 [phone]

## 2020-06-24 ENCOUNTER — Ambulatory Visit (LOCAL_COMMUNITY_HEALTH_CENTER): Payer: Medicaid Other | Admitting: Physician Assistant

## 2020-06-24 ENCOUNTER — Encounter: Payer: Self-pay | Admitting: Physician Assistant

## 2020-06-24 ENCOUNTER — Ambulatory Visit: Payer: Medicaid Other

## 2020-06-24 VITALS — BP 128/79 | Ht 66.0 in | Wt 178.0 lb

## 2020-06-24 DIAGNOSIS — Z3009 Encounter for other general counseling and advice on contraception: Secondary | ICD-10-CM | POA: Diagnosis not present

## 2020-06-24 DIAGNOSIS — Z113 Encounter for screening for infections with a predominantly sexual mode of transmission: Secondary | ICD-10-CM

## 2020-06-24 DIAGNOSIS — Z7189 Other specified counseling: Secondary | ICD-10-CM | POA: Diagnosis not present

## 2020-06-24 LAB — WET PREP FOR TRICH, YEAST, CLUE
Trichomonas Exam: NEGATIVE
Yeast Exam: NEGATIVE

## 2020-06-24 NOTE — Progress Notes (Signed)
   [place] problem visit  [place] Department  Subjective:  KILEEN LANGE is a 35 y.o. being seen today for STD testing.  Chief Complaint  Patient presents with  . Pelvic Pain    STD screening    HPI Patient states that she was told by the urgent care where she was seen yesterday to follow up with the provider that removed her Nexplanon for her pelvic pain.  States that she has a history of urinary tract and kidney infections and that her symptoms are consistent with previous symptoms she has had with these conditions. States that she is not sure if they have ordered a culture on her urine sample from yesterday but she would like STD testing to rule that out as a cause of her symptoms.  Declines pelvic exam today and states that she will self-collect samples for testing.   Does the patient have a current or past history of drug use? No   No components found for: HCV]   Health Maintenance Due  Topic Date Due  . INFLUENZA VACCINE  Never done    Review of Systems  All other systems reviewed and are negative.   The following portions of the patient's history were reviewed and updated as appropriate: allergies, current medications, past family history, past medical history, past social history, past surgical history and problem list. Problem list updated.   See flowsheet for other program required questions.  Objective:   Vitals:   06/24/20 0938  BP: 128/79  Weight: 178 lb (80.7 kg)  Height: 5\' 6"  (1.676 m)    Physical Exam Vitals and nursing note reviewed.  Constitutional:      General: She is not in acute distress.    Appearance: Normal appearance.  HENT:     Head: Normocephalic and atraumatic.  Eyes:     Conjunctiva/sclera: Conjunctivae normal.  Pulmonary:     Effort: Pulmonary effort is normal.  Skin:    General: Skin is warm and dry.  Neurological:     Mental Status: She is alert and oriented to person, place, and time.   Psychiatric:        Mood and Affect: Mood normal.        Behavior: Behavior normal.        Thought Content: Thought content normal.        Judgment: Judgment normal.       Assessment and Plan:  JOZEY JANCO is a 35 y.o. female presenting to the [facility] for a Women's Health problem visit  1. Screening for STD (sexually transmitted disease) Await test results.  Counseled that RN will call if needs to RTC for treatment once results are back.  - WET PREP FOR [place], YEAST, [facility]  2. Encounter for counseling regarding contraception Counseled patient to abstain from sex until pelvic and back pain has resolved. Enc condoms with all sex until after any treatment has been completed.  3. Other specified counseling Counseled patient that she should call her previous provider from yesterday to see if they are waiting for any other results. Enc patient to either return to provider from yesterday, see PCP or go to ER for further evaluation since we are unable to do any urinary testing today.      No follow-ups on file.  No future appointments.  Jerene Dilling, PA

## 2020-06-24 NOTE — Progress Notes (Signed)
Nexplanon removed by [facility] in Jan 2021, had vaginal bleeding for 1 day after it was removed, about a week after had back pain and cramping in front. Took laxative to see if she was constipated but started hurting worse. Having intercourse yesterday and started hurting in vaginal area/uterus, went to Urgent Care; they did urinalysis, vaginal U/S, did x-ray and did not find anything wrong. Wants to address pelvic pain and have STD testing performed. Declines birth control, is trying to get pregnant.

## 2020-06-24 NOTE — Progress Notes (Signed)
Wet mount reviewed by provider, no tx per provider orders. Pt states she will follow-up with another Dr or PCP right away. Provider orders completed.

## 2020-07-15 DIAGNOSIS — Z419 Encounter for procedure for purposes other than remedying health state, unspecified: Secondary | ICD-10-CM | POA: Diagnosis not present

## 2020-08-15 DIAGNOSIS — Z419 Encounter for procedure for purposes other than remedying health state, unspecified: Secondary | ICD-10-CM | POA: Diagnosis not present

## 2020-09-14 DIAGNOSIS — Z419 Encounter for procedure for purposes other than remedying health state, unspecified: Secondary | ICD-10-CM | POA: Diagnosis not present

## 2020-09-24 ENCOUNTER — Ambulatory Visit
Admission: EM | Admit: 2020-09-24 | Discharge: 2020-09-24 | Disposition: A | Discharge status: Home / Self Care | Payer: Medicaid Other | Attending: Sports Medicine | Admitting: Sports Medicine

## 2020-09-24 ENCOUNTER — Other Ambulatory Visit: Payer: Self-pay

## 2020-09-24 DIAGNOSIS — J040 Acute laryngitis: Secondary | ICD-10-CM | POA: Diagnosis not present

## 2020-09-24 DIAGNOSIS — J069 Acute upper respiratory infection, unspecified: Secondary | ICD-10-CM

## 2020-09-24 LAB — GROUP A STREP BY PCR: Group A Strep by PCR: NOT DETECTED

## 2020-09-24 MED ORDER — PREDNISONE 10 MG (21) PO TBPK: ORAL_TABLET | ORAL | 0 refills | Status: DC

## 2020-09-24 MED ORDER — IPRATROPIUM BROMIDE 0.06 % NA SOLN: 2.0000 | Freq: Four times a day (QID) | NASAL | 12 refills | Status: DC

## 2020-09-24 NOTE — ED Triage Notes (Signed)
Patient states that she has been having a sore throat and has since lost her voice. States that symptoms began on Monday.

## 2020-09-24 NOTE — ED Provider Notes (Signed)
[facility]    CSN: [id number] Arrival date & time: 09/24/20  4010      History   Chief Complaint Chief Complaint  Patient presents with  . Sore Throat    HPI Kaitlyn Turner is a 35 y.o. female.   HPI   35 year old female here for evaluation of sore throat.  Patient reports that she has been experiencing a sore throat for the last 2 days and that she has since lost her voice.  She reports that on the first day she had chills but is of since resolved.  She has also been experiencing sneezing and some intermittent coughing.  Patient denies fever, runny nose, ear pain or pressure, abdominal pain, nausea, vomiting, or diarrhea.  Past Medical History:  Diagnosis Date  . Anemia   . Family history of alcoholism 01/08/2015   Per [facility]  . Hypotension   . Malignant hyperthermia   . Mental disorder    Panic attacks  . Pulmonary embolism ([place])   . Vaginal Pap smear, abnormal     Patient Active Problem List   Diagnosis Date Noted  . Overweight BMI=28.8 01/30/2020  . H/O alcohol abuse--DUI 2019 01/30/2020  . History of pulmonary embolism 01/30/2020  . Chest pain 01/06/2019  . MDD (major depressive disorder), recurrent episode ([place]) 08/21/2018  . Suicidal ideation 08/20/2018  . Family disruption due to divorce or legal separation 06/03/2015  . Adjustment disorder with mixed anxiety and depressed mood 06/03/2015  . Family history of alcoholism 01/08/2015    Past Surgical History:  Procedure Laterality Date  . DENTAL SURGERY      OB History    Gravida  8   Para  3   Term  3   Preterm  0   AB  5   Living  3     SAB  3   IAB  2   Ectopic  0   Multiple  0   Live Births  3            Home Medications    Prior to Admission medications   Medication Sig Start Date End Date Taking? Authorizing Provider  ipratropium (ATROVENT) 0.06 % nasal spray Place 2 sprays into both nostrils 4 (four) times daily. 09/24/20  Yes Margarette Canada, NP   predniSONE (STERAPRED UNI-PAK 21 TAB) 10 MG (21) TBPK tablet Take 6 tablets on day 1, 5 tablets day 2, 4 tablets day 3, 3 tablets day 4, 2 tablets day 5, 1 tablet day 6 09/24/20  Yes Margarette Canada, NP  COLCRYS 0.6 MG tablet Take 1 tablet (0.6 mg total) by mouth daily. Patient not taking: Reported on 01/30/2020 06/26/19 05/30/20  Kate Sable, MD  pantoprazole (PROTONIX) 40 MG tablet Take 1 tablet (40 mg total) by mouth daily. For 1 month. Patient not taking: Reported on 01/30/2020 06/21/19 05/30/20  Kate Sable, MD  traZODone (DESYREL) 50 MG tablet Take 1 tablet (50 mg total) by mouth at bedtime and may repeat dose one time if needed. For sleep 08/23/18 01/06/19  Connye Burkitt, NP    Family History Family History  Problem Relation Age of Onset  . Cancer Mother        Unsure  . Hypertension Father   . Hypertension Maternal Grandmother   . Heart disease Maternal Grandmother     Social History Social History   Tobacco Use  . Smoking status: Never Smoker  . Smokeless tobacco: Never Used  Vaping Use  . Vaping Use:  Never used  Substance Use Topics  . Alcohol use: Not Currently    Comment: occasionally  . Drug use: No     Allergies   Stadol [butorphanol], Keflex [cephalexin], Other, Pork-derived products, and Toradol [ketorolac tromethamine]   Review of Systems Review of Systems  Constitutional: Positive for chills. Negative for activity change, appetite change and fever.  HENT: Positive for sneezing and sore throat. Negative for congestion, ear pain and rhinorrhea.   Respiratory: Positive for cough. Negative for shortness of breath and wheezing.   Gastrointestinal: Negative for abdominal pain, diarrhea, nausea and vomiting.  Musculoskeletal: Negative for arthralgias and myalgias.  Skin: Negative for rash.  Hematological: Negative.   Psychiatric/Behavioral: Negative.      Physical Exam Triage Vital Signs ED Triage Vitals  Enc Vitals Group     BP 09/24/20 1031 125/89      Pulse Rate 09/24/20 1031 62     Resp 09/24/20 1031 18     Temp 09/24/20 1031 98.5 F (36.9 C)     Temp Source 09/24/20 1031 Oral     SpO2 09/24/20 1031 100 %     Weight 09/24/20 1031 180 lb (81.6 kg)     Height --      Head Circumference --      Peak Flow --      Pain Score 09/24/20 1030 6     Pain Loc --      Pain Edu? --      Excl. in [place]? --    No data found.  Updated Vital Signs BP 125/89 (BP Location: Right Arm)   Pulse 62   Temp 98.5 F (36.9 C) (Oral)   Resp 18   Wt 180 lb (81.6 kg)   LMP 09/02/2020   SpO2 100%   BMI 29.05 kg/m   Visual Acuity Right Eye Distance:   Left Eye Distance:   Bilateral Distance:    Right Eye Near:   Left Eye Near:    Bilateral Near:     Physical Exam Vitals and nursing note reviewed.  Constitutional:      General: She is not in acute distress.    Appearance: She is well-developed and normal weight. She is not ill-appearing.  HENT:     Head: Normocephalic and atraumatic.     Right Ear: Tympanic membrane and ear canal normal. Tympanic membrane is not erythematous.     Left Ear: Tympanic membrane and ear canal normal. Tympanic membrane is not erythematous.     Nose: Congestion and rhinorrhea present.     Mouth/Throat:     Mouth: Mucous membranes are moist.     Pharynx: Oropharynx is clear. Uvula midline. Posterior oropharyngeal erythema present.     Tonsils: No tonsillar exudate. 0 on the right. 0 on the left.  Cardiovascular:     Rate and Rhythm: Normal rate and regular rhythm.     Heart sounds: Normal heart sounds. No murmur heard. No gallop.   Pulmonary:     Effort: Pulmonary effort is normal.     Breath sounds: Normal breath sounds. No wheezing, rhonchi or rales.  Musculoskeletal:     Cervical back: Normal range of motion and neck supple.  Lymphadenopathy:     Cervical: No cervical adenopathy.  Skin:    General: Skin is warm and dry.     Capillary Refill: Capillary refill takes less than 2 seconds.     Findings:  No erythema or rash.  Neurological:     General: No focal  deficit present.     Mental Status: She is alert and oriented to person, place, and time.  Psychiatric:        Mood and Affect: Mood normal.        Behavior: Behavior normal.      UC Treatments / Results  Labs (all labs ordered are listed, but only abnormal results are displayed) Labs Reviewed  GROUP A STREP BY PCR    EKG   Radiology No results found.  Procedures Procedures (including critical care time)  Medications Ordered in UC Medications - No data to display  Initial Impression / Assessment and Plan / UC Course  I have reviewed the triage vital signs and the nursing notes.  Pertinent labs & imaging results that were available during my care of the patient were reviewed by me and considered in my medical decision making (see chart for details).   Patient is a very pleasant, nontoxic-appearing 35 year old female here for evaluation of sore throat and laryngitis which have been going on for last 2 days.  Patient reports that she did have chills the first night but those of since resolved and she has not had a fever.  Patient is also experienced some sneezing at the onset of her symptoms and continues to present.  She denies runny nose, ear pain or pressure, or GI complaints.  No cough.  Physical exam reveals bilateral pearly gray tympanic membranes with normal light reflex and clear external auditory canals.  Nasal mucosa is erythematous, edematous, with clear nasal discharge.  Oropharyngeal exam reveals recessed tonsillar pillars without edema, erythema, or exudate.  Patient does have erythema to the posterior oropharynx with clear postnasal drip and cobblestoning.  No cervical lymphadenopathy appreciated exam.  Cardiopulmonary exam is benign.  Suspect patient's symptoms are a result of URI with postnasal drip.  Strep PCR collected at triage.  Strep PCR is negative.  Will treat patient for viral URI and postnasal drip  with ipratropium nasal spray, prednisone taper laryngitis, and supportive care.  Work note provided.   Final Clinical Impressions(s) / UC Diagnoses   Final diagnoses:  Upper respiratory tract infection, unspecified type  Laryngitis     Discharge Instructions     Use the ipratropium nasal spray, 2 squirts up each nostril every 6 hours as needed for nasal congestion and postnasal drip.  Start the prednisone taper today and you will take it each morning for period of 6 days to help with the swelling in your throat and help you regain your voice.  Rest her voice is much as possible.  Increase oral fluid intake so that you increase the thinness of your mucus.  Gargle warm salt water 2-3 times a day to soothe your throat and wash the drainage is causing inflammation.  Return for reevaluation for any new or worsening symptoms.    ED Prescriptions    Medication Sig Dispense Auth. Provider   ipratropium (ATROVENT) 0.06 % nasal spray Place 2 sprays into both nostrils 4 (four) times daily. 15 mL Margarette Canada, NP   predniSONE (STERAPRED UNI-PAK 21 TAB) 10 MG (21) TBPK tablet Take 6 tablets on day 1, 5 tablets day 2, 4 tablets day 3, 3 tablets day 4, 2 tablets day 5, 1 tablet day 6 21 tablet Margarette Canada, NP     PDMP not reviewed this encounter.   Margarette Canada, NP 09/24/20 1114

## 2020-09-24 NOTE — Discharge Instructions (Addendum)
Use the ipratropium nasal spray, 2 squirts up each nostril every 6 hours as needed for nasal congestion and postnasal drip.  Start the prednisone taper today and you will take it each morning for period of 6 days to help with the swelling in your throat and help you regain your voice.  Rest her voice is much as possible.  Increase oral fluid intake so that you increase the thinness of your mucus.  Gargle warm salt water 2-3 times a day to soothe your throat and wash the drainage is causing inflammation.  Return for reevaluation for any new or worsening symptoms.

## 2020-10-14 ENCOUNTER — Ambulatory Visit: Payer: Medicaid Other | Admitting: Physician Assistant

## 2020-10-14 ENCOUNTER — Encounter: Payer: Self-pay | Admitting: Physician Assistant

## 2020-10-14 ENCOUNTER — Other Ambulatory Visit: Payer: Self-pay

## 2020-10-14 DIAGNOSIS — B9689 Other specified bacterial agents as the cause of diseases classified elsewhere: Secondary | ICD-10-CM

## 2020-10-14 DIAGNOSIS — Z113 Encounter for screening for infections with a predominantly sexual mode of transmission: Secondary | ICD-10-CM

## 2020-10-14 DIAGNOSIS — N760 Acute vaginitis: Secondary | ICD-10-CM

## 2020-10-14 LAB — WET PREP FOR TRICH, YEAST, CLUE
Trichomonas Exam: NEGATIVE
Yeast Exam: NEGATIVE

## 2020-10-14 MED ORDER — METRONIDAZOLE 500 MG PO TABS: 500.0000 mg | ORAL_TABLET | Freq: Two times a day (BID) | ORAL | 0 refills | Status: AC

## 2020-10-14 NOTE — Progress Notes (Signed)
[facility] Department STI clinic/screening visit  Subjective:  Kaitlyn Turner is a 35 y.o. female being seen today for an STI screening visit. The patient reports they do have symptoms.  Patient reports that they do not desire a pregnancy in the next year.   They reported they are not interested in discussing contraception today.  Patient's last menstrual period was 10/04/2020.   Patient has the following medical conditions:   Patient Active Problem List   Diagnosis Date Noted  . Overweight BMI=28.8 01/30/2020  . H/O alcohol abuse--DUI 2019 01/30/2020  . History of pulmonary embolism 01/30/2020  . Chest pain 01/06/2019  . MDD (major depressive disorder), recurrent episode ([place]) 08/21/2018  . Suicidal ideation 08/20/2018  . Family disruption due to divorce or legal separation 06/03/2015  . Adjustment disorder with mixed anxiety and depressed mood 06/03/2015  . Family history of alcoholism 01/08/2015    Chief Complaint  Patient presents with  . SEXUALLY TRANSMITTED DISEASE    Screening    HPI  Patient reports that she has had a vaginal odor for about 2 weeks since changing detergent.  Denies other symptoms, chronic conditions, surgeries and any prescription medicines.  States that she takes MVI, Vitamin D, and Probiotics.  States last HIV test was 01/2020 and that she also had her pap then as well.     See flowsheet for further details and programmatic requirements.    The following portions of the patient's history were reviewed and updated as appropriate: allergies, current medications, past medical history, past social history, past surgical history and problem list.  Objective:  There were no vitals filed for this visit.  Physical Exam Constitutional:      General: She is not in acute distress.    Appearance: Normal appearance.  HENT:     Head: Normocephalic and atraumatic.  Eyes:     Conjunctiva/sclera: Conjunctivae normal.  Pulmonary:     Effort:  Pulmonary effort is normal.  Skin:    General: Skin is warm and dry.  Neurological:     Mental Status: She is alert and oriented to person, place, and time.  Psychiatric:        Mood and Affect: Mood normal.        Behavior: Behavior normal.        Thought Content: Thought content normal.        Judgment: Judgment normal.      Assessment and Plan:  [name] is a 35 y.o. female presenting to the [place] [facility] for STI screening  1. Screening for STD (sexually transmitted disease) Patient into clinic with symptoms. Patient declines blood work today.  Patient declines pelvic exam and opts to self-collect vaginal samples.  Counseled how to collect for accurate results. Rec condoms with all sex. Await test results.  Counseled that RN will call if needs to RTC for treatment once results are back. - WET PREP FOR [place], YEAST, [place] [facility]  2. BV (bacterial vaginosis) Treat for BV with Metronidazole 500 mg #14 1 po BID for 7 days with food, no EtOH for 24 hr before and until 72 hr after completing medicine. No sex for 10 days. Enc to use OTC antifungal cream if has itching during or just after antibiotic use. - metroNIDAZOLE (FLAGYL) 500 MG tablet; Take 1 tablet (500 mg total) by mouth 2 (two) times daily for 7 days.  Dispense: 14 tablet; Refill: 0     No follow-ups on  file.  No future appointments.  Jerene Dilling, PA

## 2020-10-14 NOTE — Progress Notes (Signed)
Pt here for STD screening.  Wet mount result reviewed by Provider.  Medication dispenser per Provider orders.  Pt declined condoms. Windle Guard, RN

## 2020-10-15 DIAGNOSIS — Z419 Encounter for procedure for purposes other than remedying health state, unspecified: Secondary | ICD-10-CM | POA: Diagnosis not present

## 2020-11-03 ENCOUNTER — Ambulatory Visit (LOCAL_COMMUNITY_HEALTH_CENTER): Payer: Medicaid Other

## 2020-11-03 ENCOUNTER — Other Ambulatory Visit: Payer: Self-pay

## 2020-11-03 VITALS — BP 113/72 | Ht 66.0 in | Wt 192.0 lb

## 2020-11-03 DIAGNOSIS — Z3201 Encounter for pregnancy test, result positive: Secondary | ICD-10-CM | POA: Diagnosis not present

## 2020-11-03 LAB — PREGNANCY, URINE: Preg Test, Ur: POSITIVE — AB

## 2020-11-03 MED ORDER — PRENATAL 27-0.8 MG PO TABS: 1.0000 | ORAL_TABLET | Freq: Every day | ORAL | 0 refills | Status: AC

## 2020-11-03 NOTE — Progress Notes (Signed)
UPT positive. Plans prenatal care at [facility] and has appt 12/03/2020. Pt concerned about hx pulmonary embolism 12 yrs ago and effect on preg. Consult Vertell Novak, MD who explains that pt's prenatal provider may recommend lovenox during preg and important for pt to establish prenatal care early. RN discussed provider's recommendation and pt in agreement. Advised pt to contact her prenatal provider with questions/concerns. Pt to DSS for Medicaid/Preg women. Josie Saunders, RN

## 2020-11-14 DIAGNOSIS — Z419 Encounter for procedure for purposes other than remedying health state, unspecified: Secondary | ICD-10-CM | POA: Diagnosis not present

## 2020-11-19 NOTE — Progress Notes (Signed)
Curator for clinical support staff: I agree with the care provided to this patient and was available for consultation.  I was consulted at the point of care and documentation reflects my recommendations.   Caren Macadam, MD, MPH, ABFM [facility] Medical Director

## 2020-11-23 ENCOUNTER — Emergency Department
Admission: EM | Admit: 2020-11-23 | Discharge: 2020-11-23 | Disposition: A | Discharge status: Home / Self Care | Payer: Medicaid Other | Attending: Emergency Medicine | Admitting: Emergency Medicine

## 2020-11-23 ENCOUNTER — Other Ambulatory Visit: Payer: Self-pay

## 2020-11-23 ENCOUNTER — Emergency Department: Payer: Medicaid Other

## 2020-11-23 DIAGNOSIS — O209 Hemorrhage in early pregnancy, unspecified: Secondary | ICD-10-CM | POA: Diagnosis not present

## 2020-11-23 DIAGNOSIS — N9489 Other specified conditions associated with female genital organs and menstrual cycle: Secondary | ICD-10-CM | POA: Diagnosis not present

## 2020-11-23 DIAGNOSIS — Z3A01 Less than 8 weeks gestation of pregnancy: Secondary | ICD-10-CM | POA: Insufficient documentation

## 2020-11-23 DIAGNOSIS — O219 Vomiting of pregnancy, unspecified: Secondary | ICD-10-CM | POA: Diagnosis not present

## 2020-11-23 DIAGNOSIS — O4690 Antepartum hemorrhage, unspecified, unspecified trimester: Secondary | ICD-10-CM

## 2020-11-23 LAB — URINALYSIS, COMPLETE (UACMP) WITH MICROSCOPIC
Bacteria, UA: NONE SEEN
Bilirubin Urine: NEGATIVE
Glucose, UA: NEGATIVE mg/dL
Hgb urine dipstick: NEGATIVE
Ketones, ur: NEGATIVE mg/dL
Leukocytes,Ua: NEGATIVE
Nitrite: NEGATIVE
Protein, ur: NEGATIVE mg/dL
Specific Gravity, Urine: 1.01 (ref 1.005–1.030)
pH: 7 (ref 5.0–8.0)

## 2020-11-23 LAB — BASIC METABOLIC PANEL
Anion gap: 8 (ref 5–15)
BUN: 9 mg/dL (ref 6–20)
CO2: 22 mmol/L (ref 22–32)
Calcium: 9 mg/dL (ref 8.9–10.3)
Chloride: 103 mmol/L (ref 98–111)
Creatinine, Ser: 0.69 mg/dL (ref 0.44–1.00)
GFR, Estimated: >60 mL/min (ref >60)
Glucose, Bld: 92 mg/dL (ref 70–99)
Potassium: 3.8 mmol/L (ref 3.5–5.1)
Sodium: 133 mmol/L — ABNORMAL LOW (ref 135–145)

## 2020-11-23 LAB — CBC WITH DIFFERENTIAL/PLATELET
Abs Immature Granulocytes: 0.04 10*3/uL (ref 0.00–0.07)
Basophils Absolute: 0 10*3/uL (ref 0.0–0.1)
Basophils Relative: 0 %
Eosinophils Absolute: 0.1 10*3/uL (ref 0.0–0.5)
Eosinophils Relative: 1 %
HCT: 37 % (ref 36.0–46.0)
Hemoglobin: 13.3 g/dL (ref 12.0–15.0)
Immature Granulocytes: 0 %
Lymphocytes Relative: 21 %
Lymphs Abs: 1.9 10*3/uL (ref 0.7–4.0)
MCH: 32.8 pg (ref 26.0–34.0)
MCHC: 35.9 g/dL (ref 30.0–36.0)
MCV: 91.1 fL (ref 80.0–100.0)
Monocytes Absolute: 0.4 10*3/uL (ref 0.1–1.0)
Monocytes Relative: 4 %
Neutro Abs: 6.8 10*3/uL (ref 1.7–7.7)
Neutrophils Relative %: 74 %
Platelets: 230 10*3/uL (ref 150–400)
RBC: 4.06 MIL/uL (ref 3.87–5.11)
RDW: 11.6 % (ref 11.5–15.5)
WBC: 9.2 10*3/uL (ref 4.0–10.5)
nRBC: 0 % (ref 0.0–0.2)

## 2020-11-23 LAB — HCG, QUANTITATIVE, PREGNANCY: hCG, Beta Chain, Quant, S: 140533 m[IU]/mL — ABNORMAL HIGH (ref <5)

## 2020-11-23 MED ORDER — ONDANSETRON 4 MG PO TBDP: 4.0000 mg | ORAL_TABLET | Freq: Three times a day (TID) | ORAL | 0 refills | Status: DC | PRN

## 2020-11-23 MED ORDER — ONDANSETRON 4 MG PO TBDP
4.0000 mg | ORAL_TABLET | Freq: Once | ORAL | Status: AC
  Administered 2020-11-23: 4 mg via ORAL
  Filled 2020-11-23: qty 1

## 2020-11-23 NOTE — ED Triage Notes (Signed)
Pt comes pov [redacted] weeks pregnant with some n/v and cramping. This morning she noticed some dark red spotting. Has 3 other living children. LMP 4/19.

## 2020-11-23 NOTE — ED Provider Notes (Signed)
[facility] Emergency Department Provider Note  ____________________________________________   Event Date/Time   First MD Initiated Contact with Patient 11/23/20 [phone]     (approximate)  I have reviewed the triage vital signs and the nursing notes.   HISTORY  Chief Complaint Vaginal Bleeding   HPI Kaitlyn Turner is a 35 y.o. female presents to the ED with complaint of vaginal bleeding with nausea, vomiting and cramping.  Patient states that she noticed dark red spotting this morning.  Patient has had multiple pregnancies and has had high risk pregnancies in the past.  She denies any fever, chills or diarrhea.         Past Medical History:  Diagnosis Date   Anemia    Family history of alcoholism 01/08/2015   Per [employer]   Hypotension    Malignant hyperthermia    Mental disorder    Panic attacks   Pulmonary embolism ([place])    Vaginal Pap smear, abnormal     Patient Active Problem List   Diagnosis Date Noted   Overweight BMI=28.8 01/30/2020   H/O alcohol abuse--DUI 2019 01/30/2020   History of pulmonary embolism 01/30/2020   Chest pain 01/06/2019   MDD (major depressive disorder), recurrent episode ([place]) 08/21/2018   Suicidal ideation 08/20/2018   Family disruption due to divorce or legal separation 06/03/2015   Adjustment disorder with mixed anxiety and depressed mood 06/03/2015   Family history of alcoholism 01/08/2015    Past Surgical History:  Procedure Laterality Date   DENTAL SURGERY      Prior to Admission medications   Medication Sig Start Date End Date Taking? Authorizing Provider  ondansetron (ZOFRAN ODT) 4 MG disintegrating tablet Take 1 tablet (4 mg total) by mouth every 8 (eight) hours as needed for nausea or vomiting. 11/23/20  Yes Johnn Hai, PA-C  cholecalciferol (VITAMIN D3) 25 MCG (1000 UNIT) tablet Take 1,000 Units by mouth daily.    [provider]  ipratropium (ATROVENT) 0.06 % nasal spray Place 2  sprays into both nostrils 4 (four) times daily. Patient not taking: Reported on 11/03/2020 09/24/20   Margarette Canada, NP  magnesium 30 MG tablet Take 30 mg by mouth daily.    [provider]  Prenatal Vit-Fe Fumarate-FA (MULTIVITAMIN-PRENATAL) 27-0.8 MG TABS tablet Take 1 tablet by mouth daily at 12 noon. 11/03/20 02/11/21  Caren Macadam, MD  COLCRYS 0.6 MG tablet Take 1 tablet (0.6 mg total) by mouth daily. Patient not taking: Reported on 01/30/2020 06/26/19 05/30/20  Kate Sable, MD  pantoprazole (PROTONIX) 40 MG tablet Take 1 tablet (40 mg total) by mouth daily. For 1 month. Patient not taking: Reported on 01/30/2020 06/21/19 05/30/20  Kate Sable, MD  traZODone (DESYREL) 50 MG tablet Take 1 tablet (50 mg total) by mouth at bedtime and may repeat dose one time if needed. For sleep 08/23/18 01/06/19  Connye Burkitt, NP    Allergies Stadol [butorphanol], Keflex [cephalexin], Other, Pork-derived products, and Toradol [ketorolac tromethamine]  Family History  Problem Relation Age of Onset   Cancer Mother        Unsure   Hypertension Father    Hypertension Maternal Grandmother    Heart disease Maternal Grandmother     Social History Social History   Tobacco Use   Smoking status: Never   Smokeless tobacco: Never  Vaping Use   Vaping Use: Never used  Substance Use Topics   Alcohol use: Not Currently    Comment: occasionally   Drug  use: No    Review of Systems Constitutional: No fever/chills Eyes: No visual changes. ENT: No sore throat. Cardiovascular: Denies chest pain. Respiratory: Denies shortness of breath. Gastrointestinal: No abdominal pain.  No nausea, no vomiting.  No diarrhea.  No constipation. Genitourinary: Negative for dysuria.  Positive for vaginal bleeding.  Positive pregnancy. Musculoskeletal: Negative for muscle skeletal pain. Skin: Negative for rash. Neurological: Negative for headaches, focal weakness or  numbness.  ____________________________________________   PHYSICAL EXAM:  VITAL SIGNS: ED Triage Vitals  Enc Vitals Group     BP 11/23/20 0909 114/72     Pulse Rate 11/23/20 0909 78     Resp 11/23/20 0909 18     Temp 11/23/20 0909 98 F (36.7 C)     Temp Source 11/23/20 0909 Oral     SpO2 11/23/20 0909 99 %     Weight 11/23/20 0901 191 lb 12.8 oz (87 kg)     Height 11/23/20 0901 5\' 6"  (1.676 m)     Head Circumference --      Peak Flow --      Pain Score 11/23/20 0900 6     Pain Loc --      Pain Edu? --      Excl. in [place]? --    Constitutional: Alert and oriented. Well appearing and in no acute distress.  Patient currently sipping ginger ale without continued vomiting. Eyes: Conjunctivae are normal.  Head: Atraumatic. Nose: No congestion/rhinnorhea. Mouth/Throat: Mucous membranes are moist.  Oropharynx non-erythematous. Neck: No stridor.   Cardiovascular: Normal rate, regular rhythm. Grossly normal heart sounds.  Good peripheral circulation. Respiratory: Normal respiratory effort.  No retractions. Lungs CTAB. Gastrointestinal: Soft and nontender. No distention. No CVA tenderness. Musculoskeletal: Moves upper and lower extremities they have difficulty.  Normal gait was noted.  No edema noted lower extremities. Neurologic:  Normal speech and language. No gross focal neurologic deficits are appreciated.  Skin:  Skin is warm, dry and intact. No rash noted. Psychiatric: Mood and affect are normal. Speech and behavior are normal.  ____________________________________________   LABS (all labs ordered are listed, but only abnormal results are displayed)  Labs Reviewed  URINALYSIS, COMPLETE (UACMP) WITH MICROSCOPIC - Abnormal; Notable for the following components:      Result Value   Color, Urine YELLOW (*)    APPearance HAZY (*)    All other components within normal limits  BASIC METABOLIC PANEL - Abnormal; Notable for the following components:   Sodium 133 (*)    All other  components within normal limits  HCG, QUANTITATIVE, PREGNANCY - Abnormal; Notable for the following components:   hCG, Beta Chain, Quant, S 140,533 (*)    All other components within normal limits  CBC WITH DIFFERENTIAL/PLATELET  ABO/RH   ____________________________________________  ___________________________________________  RADIOLOGY Leana Gamer, personally viewed and evaluated these images (plain radiographs) as part of my medical decision making, as well as reviewing the written report by the radiologist.    Official radiology report(s): US OB LESS THAN 14 WEEKS WITH OB TRANSVAGINAL  Result Date: 11/23/2020 CLINICAL DATA:  Vaginal bleeding for 1 day. EXAM: OBSTETRIC <14 WK [place] AND TRANSVAGINAL OB US TECHNIQUE: Both transabdominal and transvaginal ultrasound examinations were performed for complete evaluation of the gestation as well as the maternal uterus, adnexal regions, and pelvic cul-de-sac. Transvaginal technique was performed to assess early pregnancy. COMPARISON:  None. FINDINGS: Intrauterine gestational sac: Single Yolk sac:  Visualized. Embryo:  Visualized. Cardiac Activity: Visualized. Heart Rate: 153 bpm MSD:  mm    w     d CRL:  12.4 mm   7 w   3 d                  [place] EDC: 07/09/2021 Subchorionic hemorrhage:  None visualized. Maternal uterus/adnexae: Both maternal ovaries appear normal and there is no mass or free fluid identified within either adnexal region. IMPRESSION: Single live intrauterine pregnancy with estimated gestational age of [redacted] weeks and 3 days. No subchorionic hemorrhage or other complicating feature. Electronically Signed   By: Franki Cabot M.D.   On: 11/23/2020 11:58    ____________________________________________   PROCEDURES  Procedure(s) performed (including Critical Care):  Procedures   ____________________________________________   INITIAL IMPRESSION / ASSESSMENT AND PLAN / ED COURSE  As part of my medical decision making, I  reviewed the following data within the electronic MEDICAL RECORD NUMBER Notes from prior ED visits and [place] Controlled Substance Database  35 year old female presents to the ED with come discerns for vaginal bleeding this morning along with nausea, vomiting and cramping.  Patient has had pregnancies that were high risk in the past.  She is worried that this may be a miscarriage.  Patient was given Zofran and was able to sip ginger ale without any continued vomiting.  Lab work was reassuring and ultrasound shows a single IUP with a heart rate of 153 measuring 7 weeks 3 days.  Patient was discharged with a prescription for Zofran.  She is strongly encouraged to call her OB/GYN for any further medication and instructions.  She also is to return to the emergency department if any severe worsening of her symptoms or urgent concerns.  ____________________________________________   FINAL CLINICAL IMPRESSION(S) / ED DIAGNOSES  Final diagnoses:  Less than [redacted] weeks gestation of pregnancy     ED Discharge Orders          Ordered    ondansetron (ZOFRAN ODT) 4 MG disintegrating tablet  Every 8 hours PRN        11/23/20 1206             Note:  This document was prepared using Dragon voice recognition software and may include unintentional dictation errors.    Johnn Hai, PA-C 11/23/20 1217    Nance Pear, MD 11/23/20 1320

## 2020-11-23 NOTE — Discharge Instructions (Addendum)
Call make an appointment with your OB/GYN.  A prescription for Zofran was sent to your pharmacy.  This is for nausea and vomiting.  Also continue to drink fluids sipping on them frequently to stay hydrated.  If any severe worsening of your symptoms or urgent concerns return to the emergency department.

## 2020-11-23 NOTE — ED Notes (Signed)
See triage note  Presents with some abd cramping and vaginal bleeding   States she noticed blood when she wiped  Pt is approx [redacted] weeks pregnant

## 2020-11-26 LAB — ABO/RH: ABO/RH(D): O POS

## 2020-12-03 DIAGNOSIS — Z8759 Personal history of other complications of pregnancy, childbirth and the puerperium: Secondary | ICD-10-CM | POA: Diagnosis not present

## 2020-12-03 DIAGNOSIS — N912 Amenorrhea, unspecified: Secondary | ICD-10-CM | POA: Diagnosis not present

## 2020-12-03 DIAGNOSIS — Z86711 Personal history of pulmonary embolism: Secondary | ICD-10-CM | POA: Diagnosis not present

## 2020-12-12 ENCOUNTER — Encounter: Payer: Self-pay | Admitting: Advanced Practice Midwife

## 2020-12-15 ENCOUNTER — Telehealth: Payer: Self-pay

## 2020-12-15 DIAGNOSIS — Z419 Encounter for procedure for purposes other than remedying health state, unspecified: Secondary | ICD-10-CM | POA: Diagnosis not present

## 2020-12-15 NOTE — Telephone Encounter (Signed)
Call to client as received My Chart message this am that had questions concerning constipation. Returned message that RN would call her. Client is currently not an [facility] client. Call to client who states she is a maternity patient at [facility] and that's where she thought she sent message. Counseled client to call them for advice regarding her constipation. Rich Number, RN

## 2020-12-23 DIAGNOSIS — R87612 Low grade squamous intraepithelial lesion on cytologic smear of cervix (LGSIL): Secondary | ICD-10-CM | POA: Insufficient documentation

## 2020-12-24 DIAGNOSIS — O0991 Supervision of high risk pregnancy, unspecified, first trimester: Secondary | ICD-10-CM | POA: Diagnosis not present

## 2020-12-24 DIAGNOSIS — Z87898 Personal history of other specified conditions: Secondary | ICD-10-CM | POA: Diagnosis not present

## 2020-12-24 DIAGNOSIS — Z1329 Encounter for screening for other suspected endocrine disorder: Secondary | ICD-10-CM | POA: Diagnosis not present

## 2020-12-24 DIAGNOSIS — R87612 Low grade squamous intraepithelial lesion on cytologic smear of cervix (LGSIL): Secondary | ICD-10-CM | POA: Diagnosis not present

## 2020-12-24 DIAGNOSIS — O99210 Obesity complicating pregnancy, unspecified trimester: Secondary | ICD-10-CM | POA: Diagnosis not present

## 2020-12-24 DIAGNOSIS — Z86711 Personal history of pulmonary embolism: Secondary | ICD-10-CM | POA: Diagnosis not present

## 2020-12-24 DIAGNOSIS — Z113 Encounter for screening for infections with a predominantly sexual mode of transmission: Secondary | ICD-10-CM | POA: Diagnosis not present

## 2020-12-24 DIAGNOSIS — Z124 Encounter for screening for malignant neoplasm of cervix: Secondary | ICD-10-CM | POA: Diagnosis not present

## 2020-12-24 DIAGNOSIS — Z114 Encounter for screening for human immunodeficiency virus [HIV]: Secondary | ICD-10-CM | POA: Diagnosis not present

## 2020-12-24 DIAGNOSIS — Z131 Encounter for screening for diabetes mellitus: Secondary | ICD-10-CM | POA: Diagnosis not present

## 2020-12-24 LAB — OB RESULTS CONSOLE GBS: GBS: POSITIVE

## 2020-12-24 LAB — OB RESULTS CONSOLE HEPATITIS B SURFACE ANTIGEN: Hepatitis B Surface Ag: NEGATIVE

## 2020-12-24 LAB — OB RESULTS CONSOLE HIV ANTIBODY (ROUTINE TESTING): HIV: NONREACTIVE

## 2020-12-24 LAB — OB RESULTS CONSOLE VARICELLA ZOSTER ANTIBODY, IGG: Varicella: IMMUNE

## 2020-12-24 LAB — OB RESULTS CONSOLE GC/CHLAMYDIA
Chlamydia: NEGATIVE
Gonorrhea: NEGATIVE

## 2020-12-24 LAB — OB RESULTS CONSOLE RUBELLA ANTIBODY, IGM: Rubella: IMMUNE

## 2020-12-24 LAB — OB RESULTS CONSOLE RPR: RPR: NONREACTIVE

## 2021-01-01 ENCOUNTER — Encounter: Payer: Self-pay | Admitting: *Deleted

## 2021-01-01 ENCOUNTER — Other Ambulatory Visit: Payer: Self-pay

## 2021-01-01 ENCOUNTER — Emergency Department: Payer: Medicaid Other

## 2021-01-01 DIAGNOSIS — Z3A13 13 weeks gestation of pregnancy: Secondary | ICD-10-CM | POA: Insufficient documentation

## 2021-01-01 DIAGNOSIS — N9489 Other specified conditions associated with female genital organs and menstrual cycle: Secondary | ICD-10-CM | POA: Diagnosis not present

## 2021-01-01 DIAGNOSIS — Z5321 Procedure and treatment not carried out due to patient leaving prior to being seen by health care provider: Secondary | ICD-10-CM | POA: Diagnosis not present

## 2021-01-01 DIAGNOSIS — R58 Hemorrhage, not elsewhere classified: Secondary | ICD-10-CM

## 2021-01-01 DIAGNOSIS — O26851 Spotting complicating pregnancy, first trimester: Secondary | ICD-10-CM | POA: Diagnosis not present

## 2021-01-01 LAB — COMPREHENSIVE METABOLIC PANEL
ALT: 39 U/L (ref 0–44)
AST: 41 U/L (ref 15–41)
Albumin: 3.6 g/dL (ref 3.5–5.0)
Alkaline Phosphatase: 52 U/L (ref 38–126)
Anion gap: 8 (ref 5–15)
BUN: 6 mg/dL (ref 6–20)
CO2: 23 mmol/L (ref 22–32)
Calcium: 8.9 mg/dL (ref 8.9–10.3)
Chloride: 102 mmol/L (ref 98–111)
Creatinine, Ser: 0.57 mg/dL (ref 0.44–1.00)
GFR, Estimated: >60 mL/min (ref >60)
Glucose, Bld: 87 mg/dL (ref 70–99)
Potassium: 3.6 mmol/L (ref 3.5–5.1)
Sodium: 133 mmol/L — ABNORMAL LOW (ref 135–145)
Total Bilirubin: 0.4 mg/dL (ref 0.3–1.2)
Total Protein: 6.9 g/dL (ref 6.5–8.1)

## 2021-01-01 LAB — URINALYSIS, COMPLETE (UACMP) WITH MICROSCOPIC
Bilirubin Urine: NEGATIVE
Glucose, UA: NEGATIVE mg/dL
Hgb urine dipstick: NEGATIVE
Ketones, ur: NEGATIVE mg/dL
Leukocytes,Ua: NEGATIVE
Nitrite: NEGATIVE
Protein, ur: NEGATIVE mg/dL
Specific Gravity, Urine: 1.006 (ref 1.005–1.030)
pH: 6 (ref 5.0–8.0)

## 2021-01-01 LAB — CBC
HCT: 33.6 % — ABNORMAL LOW (ref 36.0–46.0)
Hemoglobin: 12.1 g/dL (ref 12.0–15.0)
MCH: 32.5 pg (ref 26.0–34.0)
MCHC: 36 g/dL (ref 30.0–36.0)
MCV: 90.3 fL (ref 80.0–100.0)
Platelets: 230 10*3/uL (ref 150–400)
RBC: 3.72 MIL/uL — ABNORMAL LOW (ref 3.87–5.11)
RDW: 11.8 % (ref 11.5–15.5)
WBC: 11 10*3/uL — ABNORMAL HIGH (ref 4.0–10.5)
nRBC: 0 % (ref 0.0–0.2)

## 2021-01-01 LAB — HCG, QUANTITATIVE, PREGNANCY: hCG, Beta Chain, Quant, S: 151530 m[IU]/mL — ABNORMAL HIGH (ref <5)

## 2021-01-01 NOTE — ED Triage Notes (Signed)
[redacted] weeks pregnant, lower abdominal pressure, vaginal bleeding (bright red, "a little more than spotting, now a pinkish residue")

## 2021-01-02 ENCOUNTER — Emergency Department
Admission: EM | Admit: 2021-01-02 | Discharge: 2021-01-02 | Disposition: A | Discharge status: Home / Self Care | Payer: Medicaid Other | Attending: Emergency Medicine | Admitting: Emergency Medicine

## 2021-01-02 DIAGNOSIS — R58 Hemorrhage, not elsewhere classified: Secondary | ICD-10-CM

## 2021-01-15 DIAGNOSIS — Z86711 Personal history of pulmonary embolism: Secondary | ICD-10-CM | POA: Diagnosis not present

## 2021-01-15 DIAGNOSIS — Z419 Encounter for procedure for purposes other than remedying health state, unspecified: Secondary | ICD-10-CM | POA: Diagnosis not present

## 2021-01-15 DIAGNOSIS — O0992 Supervision of high risk pregnancy, unspecified, second trimester: Secondary | ICD-10-CM | POA: Diagnosis not present

## 2021-01-15 DIAGNOSIS — Z3A15 15 weeks gestation of pregnancy: Secondary | ICD-10-CM | POA: Diagnosis not present

## 2021-02-14 DIAGNOSIS — Z419 Encounter for procedure for purposes other than remedying health state, unspecified: Secondary | ICD-10-CM | POA: Diagnosis not present

## 2021-02-16 NOTE — Progress Notes (Signed)
PAP due 01-2021.  Patient currently receiving prenatal care at [facility].  Will close to PAP f/u at this time. Dahlia Bailiff, RN

## 2021-02-18 DIAGNOSIS — O0992 Supervision of high risk pregnancy, unspecified, second trimester: Secondary | ICD-10-CM | POA: Diagnosis not present

## 2021-03-03 ENCOUNTER — Other Ambulatory Visit: Admission: RE | Admit: 2021-03-03 | Payer: Managed Care, Other (non HMO) | Source: Ambulatory Visit

## 2021-03-17 DIAGNOSIS — Z419 Encounter for procedure for purposes other than remedying health state, unspecified: Secondary | ICD-10-CM | POA: Diagnosis not present

## 2021-03-19 DIAGNOSIS — O4692 Antepartum hemorrhage, unspecified, second trimester: Secondary | ICD-10-CM | POA: Diagnosis not present

## 2021-03-19 DIAGNOSIS — R102 Pelvic and perineal pain: Secondary | ICD-10-CM | POA: Diagnosis not present

## 2021-03-27 ENCOUNTER — Other Ambulatory Visit
Admission: RE | Admit: 2021-03-27 | Discharge: 2021-03-27 | Disposition: A | Discharge status: Home / Self Care | Payer: Medicaid Other | Source: Ambulatory Visit | Attending: Certified Nurse Midwife | Admitting: Certified Nurse Midwife

## 2021-03-27 ENCOUNTER — Other Ambulatory Visit: Payer: Self-pay

## 2021-03-27 NOTE — Consult Note (Signed)
[facility] Anesthesia Consultation  Kaitlyn Turner QHU:[id number] DOB: 08-18-1985 DOA: 03/27/2021 PCP: Merryl Hacker, No   Requesting physician: Dr. Leafy Ro Date of consultation: 03/27/21 Reason for consultation: Hx of pulmonary embolism  CHIEF COMPLAINT:  Hx of pulmonary embolism  HISTORY OF PRESENT ILLNESS: Kaitlyn Turner  is a 35 y.o. female with a known history of pulmonary embolism. She has had 3 prior vaginal deliveries, 2 pregnancies prior to pulmonary embolism and most recent pregnancy (8 years ago) she was on lovenox during pregnancy that was transitioned to heparin closer to delivery. She also has personal hx of MH, diagnosed during a dental surgery when she was a teenager.  PAST MEDICAL HISTORY:   Past Medical History:  Diagnosis Date   Anemia    Family history of alcoholism 01/08/2015   Per [employer]   Hypotension    Malignant hyperthermia    Mental disorder    Panic attacks   Pulmonary embolism ([facility])    Vaginal Pap smear, abnormal     PAST SURGICAL HISTORY:  Past Surgical History:  Procedure Laterality Date   DENTAL SURGERY      SOCIAL HISTORY:  Social History   Tobacco Use   Smoking status: Never   Smokeless tobacco: Never  Substance Use Topics   Alcohol use: Not Currently    Comment: occasionally    FAMILY HISTORY:  Family History  Problem Relation Age of Onset   Cancer Mother        Unsure   Hypertension Father    Hypertension Maternal Grandmother    Heart disease Maternal Grandmother     DRUG ALLERGIES:  Allergies  Allergen Reactions   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   Toradol [Ketorolac Tromethamine] Hives    REVIEW OF SYSTEMS:   RESPIRATORY: No cough, shortness of breath, wheezing.  CARDIOVASCULAR: No chest pain,  orthopnea, edema.  HEMATOLOGY: No anemia, easy bruising or bleeding SKIN: No rash or lesion. NEUROLOGIC: No tingling, numbness, weakness.  PSYCHIATRY: No anxiety or depression.   MEDICATIONS AT HOME:  Prior to Admission medications   Medication Sig Start Date End Date Taking? Authorizing Provider  cholecalciferol (VITAMIN D3) 25 MCG (1000 UNIT) tablet Take 1,000 Units by mouth daily.    [provider]  ipratropium (ATROVENT) 0.06 % nasal spray Place 2 sprays into both nostrils 4 (four) times daily. Patient not taking: Reported on 11/03/2020 09/24/20   Margarette Canada, NP  magnesium 30 MG tablet Take 30 mg by mouth daily.    [provider]  ondansetron (ZOFRAN ODT) 4 MG disintegrating tablet Take 1 tablet (4 mg total) by mouth every 8 (eight) hours as needed for nausea or vomiting. 11/23/20   Letitia Neri L, PA-C  COLCRYS 0.6 MG tablet Take 1 tablet (0.6 mg total) by mouth daily. Patient not taking: Reported on 01/30/2020 06/26/19 05/30/20  Kate Sable, MD  pantoprazole (PROTONIX) 40 MG tablet Take 1 tablet (40 mg total) by mouth daily. For 1 month. Patient not taking: Reported on 01/30/2020 06/21/19 05/30/20  Kate Sable, MD  traZODone (DESYREL) 50 MG tablet Take 1 tablet (50 mg total) by mouth at bedtime and may repeat dose one time if needed. For sleep 08/23/18 01/06/19  Connye Burkitt, NP      PHYSICAL EXAMINATION:  VITAL SIGNS: Last menstrual period 10/04/2020.  GENERAL:  35 y.o.-year-old patient no acute distress.  HEENT: Head atraumatic, normocephalic.  LUNGS: No use of accessory muscles of respiration.   EXTREMITIES: No pedal edema, cyanosis, or clubbing.  NEUROLOGIC: normal gait PSYCHIATRIC: The patient is alert and oriented x 3.  SKIN: No obvious rash, lesion, or ulcer.    IMPRESSION AND PLAN:   Kaitlyn Turner  is a 35 y.o. female presenting with hx of pulmonary embolism. She is currently on prophylactic dose lovenox 40mg  once daily. Fortunately, Dr.  Leafy Ro was available at the time of our consultation and we were all able to discuss a plan for anticoagulation. She will continue on the prophylactic once daily lovenox dosing. We discussed with her that it would have to be 12 hours from her last dose of lovenox to be able to safely do either an epidural for labor or a spinal for a c-section should that be required. We discussed that if she was noticing signs of labor it would be best to hold off on giving herself any further doses of lovenox. Dr. Leafy Ro briefly mentioned the possibility of scheduling an induction at some point so that we would have control over the timing of her anticoagulation and this will be discussed further at her future OB appointments. If she were transitioned to heparin later in pregnancy, dosing of heparin [place] 5000u BID or TID would be 6 hours from time of last dose until it would be safe for neuraxial anesthesia. Any higher doses of heparin would be 12 hours until it would be safe for neuraxial anesthesia.   Because of her hx of malignant hyperthermia special considerations would need to be taken into account if she did require general anesthesia for a c-section. I discussed with her the importance of this above all else being communicated to any anesthesia provider that would be taking care of her. We discussed that it is safe for her to have general anesthesia, that there are just specific medications that she cannot have.   We discussed recommendations for having an epidural for labor and how this can sometimes help [place] to avoid general anesthesia in the setting of an emergency. We also discussed spinal anesthesia for a c-section if indicated. The ability to do any type of neuraxial anesthesia would be dependent on the timing of her last dose of anticoagulants.

## 2021-04-15 DIAGNOSIS — O0992 Supervision of high risk pregnancy, unspecified, second trimester: Secondary | ICD-10-CM | POA: Diagnosis not present

## 2021-04-15 DIAGNOSIS — Z23 Encounter for immunization: Secondary | ICD-10-CM | POA: Diagnosis not present

## 2021-04-16 DIAGNOSIS — Z419 Encounter for procedure for purposes other than remedying health state, unspecified: Secondary | ICD-10-CM | POA: Diagnosis not present

## 2021-04-22 ENCOUNTER — Observation Stay
Admission: EM | Admit: 2021-04-22 | Discharge: 2021-04-23 | Disposition: A | Discharge status: Home / Self Care | Payer: Medicaid Other | Attending: Obstetrics and Gynecology | Admitting: Obstetrics and Gynecology

## 2021-04-22 DIAGNOSIS — O4693 Antepartum hemorrhage, unspecified, third trimester: Principal | ICD-10-CM | POA: Insufficient documentation

## 2021-04-22 DIAGNOSIS — Z3A28 28 weeks gestation of pregnancy: Secondary | ICD-10-CM | POA: Insufficient documentation

## 2021-04-22 DIAGNOSIS — O99213 Obesity complicating pregnancy, third trimester: Secondary | ICD-10-CM | POA: Insufficient documentation

## 2021-04-22 DIAGNOSIS — Z20822 Contact with and (suspected) exposure to covid-19: Secondary | ICD-10-CM | POA: Insufficient documentation

## 2021-04-22 DIAGNOSIS — E669 Obesity, unspecified: Secondary | ICD-10-CM | POA: Insufficient documentation

## 2021-04-22 DIAGNOSIS — R112 Nausea with vomiting, unspecified: Secondary | ICD-10-CM | POA: Diagnosis present

## 2021-04-22 NOTE — OB Triage Note (Addendum)
Pt is a [id number] presenting to L&D for vaginal bleeding and decreased fetal movement. Pt states you woke up this morning dizzy and then became nauseous. Had some vomiting episodes today and a few diarrhea episodes. Pt endorses a headache rating it 2/10. Pt also took temperature around 2000 and it was 100.2. Pt took two tylenol at home. Pt denies taking her prescribed Lovenox today. Denies LOF. VSS. Monitors applied and assessing.

## 2021-04-23 ENCOUNTER — Other Ambulatory Visit: Payer: Self-pay

## 2021-04-23 DIAGNOSIS — E669 Obesity, unspecified: Secondary | ICD-10-CM | POA: Diagnosis not present

## 2021-04-23 DIAGNOSIS — O99891 Other specified diseases and conditions complicating pregnancy: Secondary | ICD-10-CM | POA: Diagnosis not present

## 2021-04-23 DIAGNOSIS — Z3A28 28 weeks gestation of pregnancy: Secondary | ICD-10-CM | POA: Diagnosis not present

## 2021-04-23 DIAGNOSIS — R112 Nausea with vomiting, unspecified: Secondary | ICD-10-CM | POA: Diagnosis present

## 2021-04-23 DIAGNOSIS — O212 Late vomiting of pregnancy: Secondary | ICD-10-CM | POA: Diagnosis not present

## 2021-04-23 DIAGNOSIS — R197 Diarrhea, unspecified: Secondary | ICD-10-CM | POA: Diagnosis not present

## 2021-04-23 DIAGNOSIS — Z20822 Contact with and (suspected) exposure to covid-19: Secondary | ICD-10-CM | POA: Diagnosis not present

## 2021-04-23 DIAGNOSIS — O99213 Obesity complicating pregnancy, third trimester: Secondary | ICD-10-CM | POA: Diagnosis not present

## 2021-04-23 DIAGNOSIS — O4693 Antepartum hemorrhage, unspecified, third trimester: Secondary | ICD-10-CM | POA: Diagnosis not present

## 2021-04-23 DIAGNOSIS — O368130 Decreased fetal movements, third trimester, not applicable or unspecified: Secondary | ICD-10-CM | POA: Diagnosis not present

## 2021-04-23 LAB — WET PREP, GENITAL
Clue Cells Wet Prep HPF POC: NONE SEEN
Sperm: NONE SEEN
Trich, Wet Prep: NONE SEEN
WBC, Wet Prep HPF POC: <10 (ref <10)
Yeast Wet Prep HPF POC: NONE SEEN

## 2021-04-23 LAB — RESP PANEL BY RT-PCR (FLU A&B, COVID) ARPGX2
Influenza A by PCR: NEGATIVE
Influenza B by PCR: NEGATIVE
SARS Coronavirus 2 by RT PCR: NEGATIVE

## 2021-04-23 MED ORDER — ACETAMINOPHEN 325 MG PO TABS
650.0000 mg | ORAL_TABLET | ORAL | Status: DC | PRN
  Administered 2021-04-23: 650 mg via ORAL

## 2021-04-23 MED ORDER — ONDANSETRON 4 MG PO TBDP: 4.0000 mg | ORAL_TABLET | Freq: Three times a day (TID) | ORAL | 0 refills | Status: DC | PRN

## 2021-04-23 MED ORDER — ONDANSETRON 4 MG PO TBDP
4.0000 mg | ORAL_TABLET | Freq: Once | ORAL | Status: AC
  Administered 2021-04-23: 4 mg via ORAL
  Filled 2021-04-23: qty 1

## 2021-04-23 MED ORDER — ACETAMINOPHEN 325 MG PO TABS
ORAL_TABLET | ORAL | Status: AC
  Filled 2021-04-23: qty 2

## 2021-04-23 NOTE — OB Triage Note (Signed)
Pt discharged home in stable condition per CNM order. Discharge instructions reviewed and pt verbalizes understanding.

## 2021-04-23 NOTE — Discharge Summary (Signed)
Patient ID: Kaitlyn Turner MRN: [id number] DOB/AGE: Aug 04, 1985 35 y.o.  Admit date: 04/22/2021 Discharge date: 04/23/2021  Admission Diagnoses: 35yo [id number] at [redacted]w[redacted]d presenting with vaginal bleeding and decreased fetal movement. Pt states you woke up this am with dizzy followed by nausea and a few episodes of vomiting and diarrhea. Pt reports HA 2/10 and a temperature of 100.2 around 2000. Denies LOF.   Discharge Diagnoses: Negative Covid/Flu, N/V improved with antiemetics  Factors complicating pregnancy: 1. History of pulmonary embolism  2. GBS bacteruria  3. Obesity in pregnancy - BMI 30 4. Maternal mental health diagnosis  5. Migraines  6. History of malignant hyperthermia  Prenatal Procedures: none  Consults: None  Significant Diagnostic Studies:  Results for orders placed or performed during the hospital encounter of 04/22/21 (from the past 168 hour(s))  Resp Panel by RT-PCR (Flu A&B, Covid) Nasopharyngeal Swab   Collection Time: 04/23/21 12:16 AM   Specimen: Nasopharyngeal Swab; Nasopharyngeal(NP) swabs in vial transport medium  Result Value Ref Range   SARS Coronavirus 2 by RT PCR NEGATIVE NEGATIVE   Influenza A by PCR NEGATIVE NEGATIVE   Influenza B by PCR NEGATIVE NEGATIVE  Wet prep, genital   Collection Time: 04/23/21 12:29 AM  Result Value Ref Range   Yeast Wet Prep HPF POC NONE SEEN NONE SEEN   Trich, Wet Prep NONE SEEN NONE SEEN   Clue Cells Wet Prep HPF POC NONE SEEN NONE SEEN   WBC, Wet Prep HPF POC <10 <10   Sperm NONE SEEN     Treatments: none  Hospital Course:  This is a 35 y.o. T2I7124 with IUP at [redacted]w[redacted]d seen for VB, DFM, dizziness, and N/V/D.  No leaking of fluid.    She was observed, fetal heart rate monitoring remained reassuring, and she had no signs/symptoms of preterm labor or other maternal-fetal concerns.  She was deemed stable for discharge to home with outpatient follow up.  Discharge Physical Exam:  BP 128/77 (BP Location: Left Arm)   Pulse  97   Temp 99.2 F (37.3 C) (Oral)   Resp 18   Ht 5\' 5"  (1.651 m)   Wt 87.1 kg   LMP 10/04/2020 (Exact Date)   BMI 31.95 kg/m   General: NAD CV: RRR Pulm: nl effort ABD: s/nd/nt, gravid DVT Evaluation: LE non-ttp, no evidence of DVT on exam.  NST: FHR baseline: 145 bpm Variability: moderate Accelerations: yes 10x10s Decelerations: none Category/reactivity: appropriate for GA   TOCO: quiet SVE: deferred      Discharge Condition: Stable  Disposition: Discharge disposition: 01-Home or Self Care       Allergies as of 04/23/2021       Reactions   Stadol [butorphanol] Itching   Per Harden Mo, RN   Keflex [cephalexin] Hives   Other    General anesthesia - reaction was hypothermia   Pork-derived Products Hives, Itching   Patient is allergic to pork   Toradol [ketorolac Tromethamine] Hives        Medication List     STOP taking these medications    cholecalciferol 25 MCG (1000 UNIT) tablet Commonly known as: VITAMIN D3   ipratropium 0.06 % nasal spray Commonly known as: ATROVENT   magnesium 30 MG tablet       TAKE these medications    acetaminophen 325 MG tablet Commonly known as: TYLENOL Take 650 mg by mouth every 6 (six) hours as needed.   enoxaparin 40 MG/0.4ML injection Commonly known as: LOVENOX Inject 40 mg into the  skin daily.   ondansetron 4 MG disintegrating tablet Commonly known as: ZOFRAN-ODT Take 1 tablet (4 mg total) by mouth every 8 (eight) hours as needed for nausea or vomiting.         SignedLinda Hedges, CNM 04/23/2021 7:34 AM

## 2021-05-06 DIAGNOSIS — O479 False labor, unspecified: Secondary | ICD-10-CM | POA: Diagnosis not present

## 2021-05-11 DIAGNOSIS — U071 COVID-19: Secondary | ICD-10-CM | POA: Diagnosis not present

## 2021-05-11 DIAGNOSIS — Z20822 Contact with and (suspected) exposure to covid-19: Secondary | ICD-10-CM | POA: Diagnosis not present

## 2021-05-11 DIAGNOSIS — R059 Cough, unspecified: Secondary | ICD-10-CM | POA: Diagnosis not present

## 2021-05-17 DIAGNOSIS — Z419 Encounter for procedure for purposes other than remedying health state, unspecified: Secondary | ICD-10-CM | POA: Diagnosis not present

## 2021-05-17 NOTE — L&D Delivery Note (Signed)
Delivery Note  Date of delivery: 07/01/2021 Estimated Date of Delivery: 07/10/21 Patient's last menstrual period was 10/04/2020 (exact date). EGA: [redacted]w[redacted]d  Delivery Note At 12:59 AM a viable female was delivered via Vaginal, Spontaneous (Presentation: Left Occiput Anterior).  APGAR: 8, 9; weight 8 lb 1.5 oz (3670 g).  Placenta status: Manual removal, Intact.  Cord: 3 vessels with the following complications: None.    First Stage: Labor onset: --- Augmentation : AROM and Pitocin Analgesia /Anesthesia intrapartum: Epidural AROM at [place] presented to L&D with advanced dilation. She was augmented with pitocin. Epidural placed for pain relief.   Second Stage: Complete dilation at 0049 Onset of pushing at 0054 FHR second stage Cat II Delivery at 0059 on 07/01/2021  She progressed to complete and had a spontaneous vaginal birth of a live female over an intact perineum. The fetal head was delivered in OA position with restitution to LOA. Double nuchal cord resolved with summersault maneuver. Anterior then posterior shoulders delivered spontaneously with minimal assistance. Baby placed on mom's abdomen and attended to by transition RN. Cord clamped and cut when pulseless by FOB. Cord blood obtained for newborn labs.  Third Stage: Placenta manually removed by Dr. Royal Hawthorn at [phone] - see Dr. Marisue [place] separate note Placenta disposition: Pathology Uterine tone firm / bleeding min after placenta removed TXA was given for heavy bleeding prior to placenta removal with total blood loss 1564mL IV pitocin given for hemorrhage prophylaxis  Anesthesia: Epidural Episiotomy: None Lacerations: None Suture Repair: n/a Est. Blood Loss (mL): 2111  Complications: Hemorrhage, manual removal of placenta  Mom to postpartum.  Baby to Couplet care / Skin to Skin.  Newborn: Birth Weight: 8 lb 1.5 oz (3670 g).  Apgar Scores: 8, 9 Feeding planned: breastfeeding   Kaitlyn Turner,  CNM 07/01/2021 2:53 AM

## 2021-06-04 DIAGNOSIS — O0993 Supervision of high risk pregnancy, unspecified, third trimester: Secondary | ICD-10-CM | POA: Diagnosis not present

## 2021-06-04 DIAGNOSIS — N898 Other specified noninflammatory disorders of vagina: Secondary | ICD-10-CM | POA: Diagnosis not present

## 2021-06-04 LAB — OB RESULTS CONSOLE GBS: GBS: NEGATIVE

## 2021-06-05 DIAGNOSIS — Z113 Encounter for screening for infections with a predominantly sexual mode of transmission: Secondary | ICD-10-CM | POA: Diagnosis not present

## 2021-06-05 DIAGNOSIS — N898 Other specified noninflammatory disorders of vagina: Secondary | ICD-10-CM | POA: Diagnosis not present

## 2021-06-05 DIAGNOSIS — O0993 Supervision of high risk pregnancy, unspecified, third trimester: Secondary | ICD-10-CM | POA: Diagnosis not present

## 2021-06-05 LAB — OB RESULTS CONSOLE RPR: RPR: NONREACTIVE

## 2021-06-05 LAB — OB RESULTS CONSOLE HIV ANTIBODY (ROUTINE TESTING): HIV: NONREACTIVE

## 2021-06-10 DIAGNOSIS — O99891 Other specified diseases and conditions complicating pregnancy: Secondary | ICD-10-CM | POA: Diagnosis not present

## 2021-06-10 DIAGNOSIS — Z86711 Personal history of pulmonary embolism: Secondary | ICD-10-CM | POA: Diagnosis not present

## 2021-06-14 ENCOUNTER — Observation Stay
Admission: EM | Admit: 2021-06-14 | Discharge: 2021-06-14 | Disposition: A | Discharge status: Home / Self Care | Payer: Medicaid Other | Attending: Certified Nurse Midwife | Admitting: Certified Nurse Midwife

## 2021-06-14 ENCOUNTER — Encounter: Payer: Self-pay | Admitting: Obstetrics and Gynecology

## 2021-06-14 ENCOUNTER — Other Ambulatory Visit: Payer: Self-pay

## 2021-06-14 DIAGNOSIS — Z3A36 36 weeks gestation of pregnancy: Secondary | ICD-10-CM | POA: Diagnosis not present

## 2021-06-14 DIAGNOSIS — O23593 Infection of other part of genital tract in pregnancy, third trimester: Secondary | ICD-10-CM | POA: Insufficient documentation

## 2021-06-14 DIAGNOSIS — O4703 False labor before 37 completed weeks of gestation, third trimester: Secondary | ICD-10-CM | POA: Diagnosis not present

## 2021-06-14 DIAGNOSIS — B3731 Acute candidiasis of vulva and vagina: Secondary | ICD-10-CM | POA: Insufficient documentation

## 2021-06-14 DIAGNOSIS — R102 Pelvic and perineal pain: Secondary | ICD-10-CM | POA: Diagnosis not present

## 2021-06-14 DIAGNOSIS — O479 False labor, unspecified: Secondary | ICD-10-CM | POA: Diagnosis present

## 2021-06-14 DIAGNOSIS — O99891 Other specified diseases and conditions complicating pregnancy: Secondary | ICD-10-CM | POA: Diagnosis not present

## 2021-06-14 LAB — WET PREP, GENITAL
Sperm: NONE SEEN
Trich, Wet Prep: NONE SEEN
WBC, Wet Prep HPF POC: >=10 — AB (ref <10)

## 2021-06-14 LAB — URINALYSIS, ROUTINE W REFLEX MICROSCOPIC
Bilirubin Urine: NEGATIVE
Glucose, UA: NEGATIVE mg/dL
Ketones, ur: 15 mg/dL — AB
Nitrite: NEGATIVE
Protein, ur: NEGATIVE mg/dL
Specific Gravity, Urine: 1.015 (ref 1.005–1.030)
pH: 6 (ref 5.0–8.0)

## 2021-06-14 LAB — URINALYSIS, MICROSCOPIC (REFLEX)

## 2021-06-14 MED ORDER — FLUCONAZOLE 150 MG PO TABS
150.0000 mg | ORAL_TABLET | Freq: Every day | ORAL | 0 refills | Status: DC
Start: 2021-06-14 — End: 2021-07-02

## 2021-06-14 MED ORDER — METRONIDAZOLE 500 MG PO TABS: 500.0000 mg | ORAL_TABLET | Freq: Two times a day (BID) | ORAL | 0 refills | Status: AC

## 2021-06-14 NOTE — OB Triage Note (Signed)
Pt is a [id number] at [redacted]w[redacted]d presenting to L&D triage complaining of ctx that "would not go away" and pressure in her lower abdomen. Pt states she cannot tell if she is LOF. Pt also endorse vaginal bleeding 06/13/2021 in the afternoon. +FM. Monitors applied and assessing. VSS.

## 2021-06-14 NOTE — Discharge Summary (Signed)
Patient ID: ROMY IPOCK MRN: [id number] DOB/AGE: October 19, 1985 36 y.o.  Admit date: 06/14/2021 Discharge date: 06/14/2021  Admission Diagnoses: 35yo G9P3 at [redacted]w[redacted]d with uterine contraction and pressure.   Discharge Diagnoses: Yeast infection and BV  Factors complicating pregnancy: 1. History of pulmonary embolism  2. GBS bacteruria  3. Obesity in pregnancy - BMI 30 4. Maternal mental health diagnosis  5. Migraines  6. History of malignant hyperthermia  Prenatal Procedures: NST  Consults: None  Significant Diagnostic Studies:  Results for orders placed or performed during the hospital encounter of 06/14/21 (from the past 168 hour(s))  Wet prep, genital   Collection Time: 06/14/21  7:37 AM   Specimen: Vaginal  Result Value Ref Range   Yeast Wet Prep HPF POC PRESENT (A) NONE SEEN   Trich, Wet Prep NONE SEEN NONE SEEN   Clue Cells Wet Prep HPF POC PRESENT (A) NONE SEEN   WBC, Wet Prep HPF POC >=10 (A) <10   Sperm NONE SEEN   Urinalysis, Routine w reflex microscopic Urine, Clean Catch   Collection Time: 06/14/21  7:37 AM  Result Value Ref Range   Color, Urine YELLOW YELLOW   APPearance CLEAR CLEAR   Specific Gravity, Urine 1.015 1.005 - 1.030   pH 6.0 5.0 - 8.0   Glucose, UA NEGATIVE NEGATIVE mg/dL   Hgb urine dipstick TRACE (A) NEGATIVE   Bilirubin Urine NEGATIVE NEGATIVE   Ketones, ur 15 (A) NEGATIVE mg/dL   Protein, ur NEGATIVE NEGATIVE mg/dL   Nitrite NEGATIVE NEGATIVE   Leukocytes,Ua SMALL (A) NEGATIVE  Urinalysis, Microscopic (reflex)   Collection Time: 06/14/21  7:37 AM  Result Value Ref Range   RBC / HPF 0-5 0 - 5 RBC/hpf   WBC, UA 0-5 0 - 5 WBC/hpf   Bacteria, UA RARE (A) NONE SEEN   Squamous Epithelial / LPF 0-5 0 - 5   Mucus PRESENT     Treatments: none  Hospital Course:  This is a 36 y.o. [id number] with IUP at [redacted]w[redacted]d seen for uterine contractions and pressure, noted to have a cervical exam of 2.5/60/high.  No leaking of fluid and no bleeding.  UA and wet  prep were done and pt was positive for a yeast infection and BV.  Medications sent to her pharmacy.  She was observed, fetal heart rate monitoring remained reassuring, and she had no signs/symptoms of preterm labor or other maternal-fetal concerns.  Her cervical exam was unchanged from admission.  She was deemed stable for discharge to home with outpatient follow up.  Discharge Physical Exam:  BP 132/73 (BP Location: Left Arm)    Pulse 88    Temp 98.1 F (36.7 C) (Oral)    Resp 16    Ht 5\' 6"  (1.676 m)    Wt 90.3 kg    LMP 10/04/2020 (Exact Date)    BMI 32.12 kg/m   General: NAD CV: RRR Pulm: CTABL, nl effort ABD: s/nd/nt, gravid DVT Evaluation: LE non-ttp, no evidence of DVT on exam.  NST: FHR baseline: 135 bpm Variability: moderate Accelerations: yes Decelerations: none Category/reactivity: reactive  TOCO: occasional  SVE:  Dilation: 2.5 Effacement (%): 60 Cervical Position: Posterior, Middle Station: Ballotable Exam by:: Luis Abed RN   Discharge Condition: Stable  Disposition:  Discharge disposition: 01-Home or Self Care        Allergies as of 06/14/2021       Reactions   Stadol [butorphanol] Itching   Per Harden Mo, RN   Keflex [cephalexin] Hives  Other    General anesthesia - reaction was hypothermia   Pork-derived Products Hives, Itching   Patient is allergic to pork   Toradol [ketorolac Tromethamine] Hives        Medication List     TAKE these medications    acetaminophen 325 MG tablet Commonly known as: TYLENOL Take 650 mg by mouth every 6 (six) hours as needed.   enoxaparin 40 MG/0.4ML injection Commonly known as: LOVENOX Inject 40 mg into the skin daily.   fluconazole 150 MG tablet Commonly known as: Diflucan Take 1 tablet (150 mg total) by mouth daily.   metroNIDAZOLE 500 MG tablet Commonly known as: Flagyl Take 1 tablet (500 mg total) by mouth 2 (two) times daily for 7 days.   ondansetron 4 MG disintegrating tablet Commonly  known as: ZOFRAN-ODT Take 1 tablet (4 mg total) by mouth every 8 (eight) hours as needed for nausea or vomiting.         SignedLinda Hedges, CNM 06/14/2021 9:30 AM

## 2021-06-14 NOTE — Discharge Instructions (Signed)
Please keep scheduled appointment. You may call the office for follow up.  If you have questions or concerns you may call your on call provider.  If you have urgent concerns you may go to the nearest emergency department for evaluation.  Prescriptions have been sent to your pharmacy.

## 2021-06-17 DIAGNOSIS — Z419 Encounter for procedure for purposes other than remedying health state, unspecified: Secondary | ICD-10-CM | POA: Diagnosis not present

## 2021-06-17 DIAGNOSIS — Z87898 Personal history of other specified conditions: Secondary | ICD-10-CM | POA: Insufficient documentation

## 2021-06-18 DIAGNOSIS — O0993 Supervision of high risk pregnancy, unspecified, third trimester: Secondary | ICD-10-CM | POA: Diagnosis not present

## 2021-06-18 DIAGNOSIS — O99113 Other diseases of the blood and blood-forming organs and certain disorders involving the immune mechanism complicating pregnancy, third trimester: Secondary | ICD-10-CM | POA: Diagnosis not present

## 2021-06-18 DIAGNOSIS — Z86711 Personal history of pulmonary embolism: Secondary | ICD-10-CM | POA: Diagnosis not present

## 2021-06-22 ENCOUNTER — Other Ambulatory Visit: Payer: Self-pay | Admitting: Obstetrics and Gynecology

## 2021-06-22 NOTE — Progress Notes (Signed)
Dating: EDD: 07/09/21  by LMP: 10/02/20 and c/w [place] at [redacted]w[redacted]d wks.   Preg c/b: Hx Pulmonary embolus 2011, Lovenox daily until signs of labor, plan Lovenox 1mg /kg BID 12 hrs after SVD. 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, BMI 30 Maternal mental health dx: depression, not taking Zoloft  Hx migraines Hx malignant hyperthermia, anesthesia consult complete 03/2021 Hx Covid in 3rd trimester  Prenatal Labs: Blood type/Rh O Pos  Antibody screen neg  Rubella Immune  Varicella Immune  RPR NR  HBsAg Neg  HIV NR  GC neg  Chlamydia neg  Genetic screening negative  1 hour GTT 116  3 hour GTT   GBS Pos    Contraception: none Infant feeding: breast Tdap: 04/15/21 Flu: declined

## 2021-06-24 DIAGNOSIS — O99113 Other diseases of the blood and blood-forming organs and certain disorders involving the immune mechanism complicating pregnancy, third trimester: Secondary | ICD-10-CM | POA: Diagnosis not present

## 2021-06-24 DIAGNOSIS — Z86711 Personal history of pulmonary embolism: Secondary | ICD-10-CM | POA: Diagnosis not present

## 2021-06-24 DIAGNOSIS — O0993 Supervision of high risk pregnancy, unspecified, third trimester: Secondary | ICD-10-CM | POA: Diagnosis not present

## 2021-06-24 DIAGNOSIS — O99213 Obesity complicating pregnancy, third trimester: Secondary | ICD-10-CM | POA: Diagnosis not present

## 2021-06-29 DIAGNOSIS — O98512 Other viral diseases complicating pregnancy, second trimester: Secondary | ICD-10-CM | POA: Diagnosis not present

## 2021-06-29 DIAGNOSIS — O99891 Other specified diseases and conditions complicating pregnancy: Secondary | ICD-10-CM | POA: Diagnosis not present

## 2021-06-29 DIAGNOSIS — U071 COVID-19: Secondary | ICD-10-CM | POA: Diagnosis not present

## 2021-06-29 DIAGNOSIS — Z86711 Personal history of pulmonary embolism: Secondary | ICD-10-CM | POA: Diagnosis not present

## 2021-06-29 DIAGNOSIS — O0993 Supervision of high risk pregnancy, unspecified, third trimester: Secondary | ICD-10-CM | POA: Diagnosis not present

## 2021-06-29 DIAGNOSIS — Z87898 Personal history of other specified conditions: Secondary | ICD-10-CM | POA: Diagnosis not present

## 2021-06-30 ENCOUNTER — Inpatient Hospital Stay
Admission: EM | Admit: 2021-06-30 | Discharge: 2021-07-02 | DRG: 797 | Disposition: A | Discharge status: Home / Self Care | Payer: Medicaid Other | Attending: Obstetrics | Admitting: Obstetrics

## 2021-06-30 ENCOUNTER — Encounter: Payer: Self-pay | Admitting: Obstetrics and Gynecology

## 2021-06-30 ENCOUNTER — Other Ambulatory Visit: Payer: Self-pay

## 2021-06-30 ENCOUNTER — Inpatient Hospital Stay: Payer: Medicaid Other | Admitting: Anesthesiology

## 2021-06-30 DIAGNOSIS — Z3A38 38 weeks gestation of pregnancy: Secondary | ICD-10-CM

## 2021-06-30 DIAGNOSIS — Z8616 Personal history of COVID-19: Secondary | ICD-10-CM | POA: Diagnosis not present

## 2021-06-30 DIAGNOSIS — D62 Acute posthemorrhagic anemia: Secondary | ICD-10-CM | POA: Diagnosis not present

## 2021-06-30 DIAGNOSIS — O9081 Anemia of the puerperium: Secondary | ICD-10-CM | POA: Diagnosis not present

## 2021-06-30 DIAGNOSIS — Z20822 Contact with and (suspected) exposure to covid-19: Secondary | ICD-10-CM | POA: Diagnosis not present

## 2021-06-30 DIAGNOSIS — O26893 Other specified pregnancy related conditions, third trimester: Secondary | ICD-10-CM | POA: Diagnosis not present

## 2021-06-30 DIAGNOSIS — O479 False labor, unspecified: Principal | ICD-10-CM | POA: Diagnosis present

## 2021-06-30 DIAGNOSIS — O99214 Obesity complicating childbirth: Secondary | ICD-10-CM | POA: Diagnosis present

## 2021-06-30 DIAGNOSIS — Z86711 Personal history of pulmonary embolism: Secondary | ICD-10-CM

## 2021-06-30 LAB — CBC
HCT: 35.6 % — ABNORMAL LOW (ref 36.0–46.0)
Hemoglobin: 12 g/dL (ref 12.0–15.0)
MCH: 30.3 pg (ref 26.0–34.0)
MCHC: 33.7 g/dL (ref 30.0–36.0)
MCV: 89.9 fL (ref 80.0–100.0)
Platelets: 239 10*3/uL (ref 150–400)
RBC: 3.96 MIL/uL (ref 3.87–5.11)
RDW: 13.3 % (ref 11.5–15.5)
WBC: 9.9 10*3/uL (ref 4.0–10.5)
nRBC: 0 % (ref 0.0–0.2)

## 2021-06-30 LAB — RESP PANEL BY RT-PCR (FLU A&B, COVID) ARPGX2
Influenza A by PCR: NEGATIVE
Influenza B by PCR: NEGATIVE
SARS Coronavirus 2 by RT PCR: NEGATIVE

## 2021-06-30 LAB — TYPE AND SCREEN
ABO/RH(D): O POS
Antibody Screen: NEGATIVE

## 2021-06-30 MED ORDER — SOD CITRATE-CITRIC ACID 500-334 MG/5ML PO SOLN: 30.0000 mL | ORAL | Status: DC | PRN

## 2021-06-30 MED ORDER — ONDANSETRON HCL 4 MG/2ML IJ SOLN
4.0000 mg | Freq: Four times a day (QID) | INTRAMUSCULAR | Status: DC | PRN
  Administered 2021-07-01: 4 mg via INTRAVENOUS
  Filled 2021-06-30 (×2): qty 2

## 2021-06-30 MED ORDER — PHENYLEPHRINE 40 MCG/ML (10ML) SYRINGE FOR IV PUSH (FOR BLOOD PRESSURE SUPPORT): 80.0000 ug | PREFILLED_SYRINGE | INTRAVENOUS | Status: DC | PRN

## 2021-06-30 MED ORDER — TERBUTALINE SULFATE 1 MG/ML IJ SOLN: 0.2500 mg | Freq: Once | INTRAMUSCULAR | Status: DC | PRN

## 2021-06-30 MED ORDER — FENTANYL-BUPIVACAINE-NACL 0.5-0.125-0.9 MG/250ML-% EP SOLN
EPIDURAL | Status: AC
  Filled 2021-06-30: qty 250

## 2021-06-30 MED ORDER — LIDOCAINE-EPINEPHRINE (PF) 1.5 %-1:200000 IJ SOLN
INTRAMUSCULAR | Status: DC | PRN
  Administered 2021-06-30: 3 mL via EPIDURAL

## 2021-06-30 MED ORDER — OXYTOCIN-SODIUM CHLORIDE 30-0.9 UT/500ML-% IV SOLN
2.5000 [IU]/h | INTRAVENOUS | Status: DC
  Administered 2021-07-01: 2.5 [IU]/h via INTRAVENOUS
  Filled 2021-06-30 (×2): qty 500

## 2021-06-30 MED ORDER — DIPHENHYDRAMINE HCL 50 MG/ML IJ SOLN
12.5000 mg | INTRAMUSCULAR | Status: DC | PRN
  Administered 2021-06-30: 12.5 mg via INTRAVENOUS
  Filled 2021-06-30: qty 1

## 2021-06-30 MED ORDER — EPHEDRINE 5 MG/ML INJ: 10.0000 mg | INTRAVENOUS | Status: DC | PRN

## 2021-06-30 MED ORDER — BUPIVACAINE HCL (PF) 0.25 % IJ SOLN
INTRAMUSCULAR | Status: DC | PRN
  Administered 2021-06-30: 4 mL via EPIDURAL

## 2021-06-30 MED ORDER — FENTANYL-BUPIVACAINE-NACL 0.5-0.125-0.9 MG/250ML-% EP SOLN: 12.0000 mL/h | EPIDURAL | Status: DC | PRN

## 2021-06-30 MED ORDER — LACTATED RINGERS IV SOLN: 500.0000 mL | INTRAVENOUS | Status: DC | PRN

## 2021-06-30 MED ORDER — LIDOCAINE HCL (PF) 1 % IJ SOLN
INTRAMUSCULAR | Status: DC | PRN
  Administered 2021-06-30: 2 mL via SUBCUTANEOUS

## 2021-06-30 MED ORDER — LIDOCAINE HCL (PF) 1 % IJ SOLN: 30.0000 mL | INTRAMUSCULAR | Status: DC | PRN

## 2021-06-30 MED ORDER — OXYTOCIN BOLUS FROM INFUSION
333.0000 mL | Freq: Once | INTRAVENOUS | Status: AC
  Administered 2021-07-01: 333 mL via INTRAVENOUS

## 2021-06-30 MED ORDER — OXYTOCIN-SODIUM CHLORIDE 30-0.9 UT/500ML-% IV SOLN
1.0000 m[IU]/min | INTRAVENOUS | Status: DC
  Administered 2021-06-30: 2 m[IU]/min via INTRAVENOUS

## 2021-06-30 MED ORDER — LACTATED RINGERS IV SOLN
500.0000 mL | Freq: Once | INTRAVENOUS | Status: AC
  Administered 2021-06-30: 500 mL via INTRAVENOUS

## 2021-06-30 MED ORDER — ACETAMINOPHEN 325 MG PO TABS: 650.0000 mg | ORAL_TABLET | ORAL | Status: DC | PRN

## 2021-06-30 MED ORDER — LACTATED RINGERS IV SOLN: INTRAVENOUS | Status: DC

## 2021-06-30 MED ORDER — CLINDAMYCIN PHOSPHATE 900 MG/50ML IV SOLN
900.0000 mg | Freq: Three times a day (TID) | INTRAVENOUS | Status: DC
  Administered 2021-06-30 – 2021-07-01 (×2): 900 mg via INTRAVENOUS
  Filled 2021-06-30 (×2): qty 50

## 2021-06-30 NOTE — Anesthesia Procedure Notes (Signed)
Epidural Patient location during procedure: OB Start time: 06/30/2021 5:46 PM End time: 06/30/2021 5:49 PM  Staffing Anesthesiologist: Arita Miss, MD Resident/CRNA: Jerrye Noble, CRNA Performed: resident/CRNA   Preanesthetic Checklist Completed: patient identified, IV checked, site marked, risks and benefits discussed, surgical consent, monitors and equipment checked, pre-op evaluation and timeout performed  Epidural Patient position: sitting Prep: ChloraPrep Patient monitoring: heart rate, continuous pulse ox and blood pressure Approach: midline Location: L3-L4 Injection technique: LOR saline  Needle:  Needle type: Tuohy  Needle gauge: 17 G Needle length: 9 cm and 9 Needle insertion depth: 7 cm Catheter type: closed end flexible Catheter size: 19 Gauge Catheter at skin depth: 11 cm Test dose: negative and 1.5% lidocaine with Epi 1:200 K  Assessment Sensory level: T10 Events: blood not aspirated, injection not painful, no injection resistance, no paresthesia and negative IV test  Additional Notes 1 attempt Pt. Evaluated and documentation done after procedure finished. Patient identified. Risks/Benefits/Options discussed with patient including but not limited to bleeding, infection, nerve damage, paralysis, failed block, incomplete pain control, headache, blood pressure changes, nausea, vomiting, reactions to medication both or allergic, itching and postpartum back pain. Confirmed with bedside nurse the patient's most recent platelet count. Confirmed with patient that they are not currently taking any anticoagulation, have any bleeding history or any family history of bleeding disorders. Patient expressed understanding and wished to proceed. All questions were answered. Sterile technique was used throughout the entire procedure. Please see nursing notes for vital signs. Test dose was given through epidural catheter and negative prior to continuing to dose epidural or start  infusion. Warning signs of high block given to the patient including shortness of breath, tingling/numbness in hands, complete motor block, or any concerning symptoms with instructions to call for help. Patient was given instructions on fall risk and not to get out of bed. All questions and concerns addressed with instructions to call with any issues or inadequate analgesia.    Patient tolerated the insertion well without immediate complications.Reason for block:procedure for pain

## 2021-06-30 NOTE — Progress Notes (Signed)
Labor Progress Note  Kaitlyn Turner is a 36 y.o. [phone] at [redacted]w[redacted]d by LMP admitted for advanced dilation and irregular UCs  Subjective: Pt is comfortable with epidural.  Objective: BP 126/81 (BP Location: Right Arm)    Pulse 94    Temp 97.8 F (36.6 C) (Oral)    Resp 18    Ht 5\' 6"  (1.676 m)    Wt 92.1 kg    LMP 10/04/2020 (Exact Date)    SpO2 98%    BMI 32.77 kg/m   Fetal Assessment: FHT:  FHR: 130 bpm, variability: moderate,  accelerations:  Present,  decelerations:  Absent Category/reactivity:  Category I UC:   irregular SVE:    Dilation: 5cm  Effacement: 80%  Station:  -2  Consistency: soft  Position: anterior  Membrane status:AROM at 1940 Amniotic color: clear  Labs: Lab Results  Component Value Date   WBC 9.9 06/30/2021   HGB 12.0 06/30/2021   HCT 35.6 (L) 06/30/2021   MCV 89.9 06/30/2021   PLT 239 06/30/2021    Assessment / Plan: Augmentation of labor, progressing well  Labor: Progressing normally AROM Starting Pitocin Preeclampsia:   126/81 Fetal Wellbeing:  Category I Pain Control:  Epidural I/D:   Afebrile, GBS pos - 1 dose completed, AROM x 1 hr Anticipated MOD:  NSVD  Clydene Laming, CNM 06/30/2021, 7:39 PM

## 2021-06-30 NOTE — OB Triage Note (Signed)
Patient is G93, [redacted]w[redacted]d that came in due to having contractions and states that she was 5cm yesterday in office. Patient states that she called and was told to come in by the midwife. Patient states that she had consistent contractions yesterday, but today her contractions are random. She said that the contractions have gotten stronger and longer. Patient denies any bleeding. Patient does say that she has some leaking but is not sure if it is amniotic fluid, no gushes of fluid. Patient has +FM. VSS. Monitors applied and assessing.

## 2021-06-30 NOTE — Anesthesia Preprocedure Evaluation (Addendum)
Anesthesia Evaluation  Patient identified by MRN, date of birth, ID band Patient awake    Reviewed: Allergy & Precautions, H&P , NPO status , Patient's Chart, lab work & pertinent test results  History of Anesthesia Complications (+) MALIGNANT HYPERTHERMIA and history of anesthetic complications  Airway Mallampati: II       Dental no notable dental hx. (+) Teeth Intact   Pulmonary neg sleep apnea, neg COPD, Patient abstained from smoking.Not current smoker, PE          Cardiovascular Exercise Tolerance: Good METS(-) hypertension(-) CAD and (-) Past MI (-) dysrhythmias      Neuro/Psych PSYCHIATRIC DISORDERS Depression negative neurological ROS     GI/Hepatic GERD  Controlled,(+)     (-) substance abuse  ,   Endo/Other  neg diabetes  Renal/GU negative Renal ROS     Musculoskeletal   Abdominal   Peds  Hematology  (+) Blood dyscrasia, anemia ,  On Lovenox for PE; last dose more than 12 hours prior (yesterday afternoon)   Anesthesia Other Findings Past Medical History: No date: Anemia 01/08/2015: Family history of alcoholism     Comment:  Per [employer] No date: Hypotension No date: Malignant hyperthermia No date: Mental disorder     Comment:  Panic attacks No date: Pulmonary embolism (HCC) No date: Vaginal Pap smear, abnormal  Reproductive/Obstetrics (+) Pregnancy                            Anesthesia Physical Anesthesia Plan  ASA: 2  Anesthesia Plan: Epidural   Post-op Pain Management:    Induction:   PONV Risk Score and Plan: 2 and Treatment may vary due to age or medical condition and Ondansetron  Airway Management Planned: Natural Airway  Additional Equipment:   Intra-op Plan:   Post-operative Plan:   Informed Consent: I have reviewed the patients History and Physical, chart, labs and discussed the procedure including the risks, benefits and alternatives for the  proposed anesthesia with the patient or authorized representative who has indicated his/her understanding and acceptance.       Plan Discussed with: Anesthesiologist  Anesthesia Plan Comments: (Discussed R/B/A of neuraxial anesthesia technique with patient: - rare risks of spinal/epidural hematoma, nerve damage, infection - Risk of PDPH - Risk of itching - Risk of nausea and vomiting - Risk of poor block necessitating replacement of epidural. - Risk of allergic reactions. Patient voiced understanding.)       Anesthesia Quick Evaluation

## 2021-06-30 NOTE — H&P (Signed)
OB History & Physical   History of Present Illness:  Chief Complaint:   HPI:  Kaitlyn Turner is a 35 y.o. D3T7017 female at [redacted]w[redacted]d dated by LMP of 10/02/20.  She presents to L&D for contractions and leaking fluid  She reports:  -active fetal movement -worried about leaking fluid earlier today but does not seem grossly ruptured -no vaginal bleeding -onset of contractions yesterday after her office appt. Have become stronger but more spaced out today  Pregnancy Issues: Hx Pulmonary embolus 2011, Lovenox daily until signs of labor, plan Lovenox 1mg /kg BID 12 hrs after SVD. 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, BMI 30 Maternal mental health dx: depression, not taking Zoloft  Hx migraines Hx malignant hyperthermia, anesthesia consult complete 03/2021 Hx Covid in 3rd trimester   Maternal Medical History:   Past Medical History:  Diagnosis Date   Anemia    Family history of alcoholism 01/08/2015   Per [employer]   Hypotension    Malignant hyperthermia    Mental disorder    Panic attacks   Pulmonary embolism ([place])    Vaginal Pap smear, abnormal     Past Surgical History:  Procedure Laterality Date   DENTAL SURGERY      Allergies  Allergen Reactions   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   Toradol [Ketorolac Tromethamine] Hives    Prior to Admission medications   Medication Sig Start Date End Date Taking? Authorizing Provider  acetaminophen (TYLENOL) 325 MG tablet Take 650 mg by mouth every 6 (six) hours as needed.    [provider]  enoxaparin (LOVENOX) 40 MG/0.4ML injection Inject 40 mg into the skin daily.    [provider]  fluconazole (DIFLUCAN) 150 MG tablet Take 1 tablet (150 mg total) by mouth daily. 06/14/21   Linda Hedges, CNM  ondansetron  (ZOFRAN-ODT) 4 MG disintegrating tablet Take 1 tablet (4 mg total) by mouth every 8 (eight) hours as needed for nausea or vomiting. 04/23/21   Linda Hedges, CNM  COLCRYS 0.6 MG tablet Take 1 tablet (0.6 mg total) by mouth daily. Patient not taking: Reported on 01/30/2020 06/26/19 05/30/20  Kate Sable, MD  pantoprazole (PROTONIX) 40 MG tablet Take 1 tablet (40 mg total) by mouth daily. For 1 month. Patient not taking: Reported on 01/30/2020 06/21/19 05/30/20  Kate Sable, MD  traZODone (DESYREL) 50 MG tablet Take 1 tablet (50 mg total) by mouth at bedtime and may repeat dose one time if needed. For sleep 08/23/18 01/06/19  Connye Burkitt, NP     Prenatal care site: [place]   Social History: She  reports that she has never smoked. She has never used smokeless tobacco. She reports that she does not currently use alcohol. She reports that she does not use drugs.  Family History: family history includes Cancer in her mother; Heart disease in her maternal grandmother; Hypertension in her father and maternal grandmother.   Review of Systems: A full review of systems was performed and negative except as noted in the HPI.    Physical Exam:  Vital Signs: BP 132/75    Pulse 93    Ht 5\' 6"  (1.676 m)    Wt 92.1 kg    LMP 10/04/2020 (Exact Date)    BMI  32.77 kg/m   General:   alert and cooperative  Skin:  normal  Neurologic:    Alert & oriented x 3  Lungs:    Nl effort  Heart:   regular rate and rhythm  Abdomen:  normal findings: soft, non-tender  Extremities: : non-tender, symmetric, no edema bilaterally.       Pertinent Results:  Prenatal Labs: Blood type/Rh O Pos  Antibody screen neg  Rubella Immune  Varicella Immune  RPR NR  HBsAg Neg  HIV NR  GC neg  Chlamydia neg  Genetic screening negative  1 hour GTT 116  3 hour GTT    GBS Pos   FHT: FHR: 145 bpm, variability: moderate,  accelerations:  Present,  decelerations:  Absent Category/reactivity:  Category  I TOCO: irregular SVE: Dilation: 5 / Effacement (%): 80 / Station: -2     Assessment:  Kaitlyn Turner is a 36 y.o. F3O3291 female at [redacted]w[redacted]d with advanced dilation and uterine contractions.   Plan:  1. Admit to Labor & Delivery; consents reviewed and obtained  2. Fetal Well being  - Fetal Tracing: Cat I - GBS Pos - Presentation: VTX confirmed by SVE   3. Routine OB: - Prenatal labs reviewed, as above - Rh pos - CBC & T&S on admit - Clear fluids, IVF  4. Labor Augmentation -  Contractions byexternal toco in place -  Plan for augmentation with Pitocin -  Plan for continuous fetal monitoring  -  Maternal pain control as desired: IVPM, nitrous, regional anesthesia - Anticipate vaginal delivery  5. Post Partum Planning: - Infant feeding: Breast - Contraception: None - Tdap: 04/15/21 - Flu: declined  Jeffre Enriques, CNM 06/30/2021 2:50 PM

## 2021-07-01 ENCOUNTER — Encounter: Payer: Self-pay | Admitting: Obstetrics and Gynecology

## 2021-07-01 DIAGNOSIS — Z86711 Personal history of pulmonary embolism: Secondary | ICD-10-CM

## 2021-07-01 DIAGNOSIS — Z3A38 38 weeks gestation of pregnancy: Secondary | ICD-10-CM | POA: Diagnosis not present

## 2021-07-01 DIAGNOSIS — O9081 Anemia of the puerperium: Secondary | ICD-10-CM | POA: Diagnosis not present

## 2021-07-01 HISTORY — DX: Personal history of pulmonary embolism: Z86.711

## 2021-07-01 LAB — CBC
HCT: 30.6 % — ABNORMAL LOW (ref 36.0–46.0)
HCT: 27.2 % — ABNORMAL LOW (ref 36.0–46.0)
HCT: 24.8 % — ABNORMAL LOW (ref 36.0–46.0)
Hemoglobin: 10 g/dL — ABNORMAL LOW (ref 12.0–15.0)
Hemoglobin: 9.1 g/dL — ABNORMAL LOW (ref 12.0–15.0)
Hemoglobin: 8.4 g/dL — ABNORMAL LOW (ref 12.0–15.0)
MCH: 30.3 pg (ref 26.0–34.0)
MCH: 30.5 pg (ref 26.0–34.0)
MCH: 30.8 pg (ref 26.0–34.0)
MCHC: 32.7 g/dL (ref 30.0–36.0)
MCHC: 33.5 g/dL (ref 30.0–36.0)
MCHC: 33.9 g/dL (ref 30.0–36.0)
MCV: 92.7 fL (ref 80.0–100.0)
MCV: 91.3 fL (ref 80.0–100.0)
MCV: 90.8 fL (ref 80.0–100.0)
Platelets: 183 10*3/uL (ref 150–400)
Platelets: 176 10*3/uL (ref 150–400)
Platelets: 168 10*3/uL (ref 150–400)
RBC: 3.3 MIL/uL — ABNORMAL LOW (ref 3.87–5.11)
RBC: 2.98 MIL/uL — ABNORMAL LOW (ref 3.87–5.11)
RBC: 2.73 MIL/uL — ABNORMAL LOW (ref 3.87–5.11)
RDW: 13.3 % (ref 11.5–15.5)
RDW: 13.5 % (ref 11.5–15.5)
RDW: 13.4 % (ref 11.5–15.5)
WBC: 12.9 10*3/uL — ABNORMAL HIGH (ref 4.0–10.5)
WBC: 15.5 10*3/uL — ABNORMAL HIGH (ref 4.0–10.5)
WBC: 11.9 10*3/uL — ABNORMAL HIGH (ref 4.0–10.5)
nRBC: 0 % (ref 0.0–0.2)
nRBC: 0 % (ref 0.0–0.2)
nRBC: 0 % (ref 0.0–0.2)

## 2021-07-01 LAB — SYPHILIS: RPR W/REFLEX TO RPR TITER AND TREPONEMAL ANTIBODIES, TRADITIONAL SCREENING AND DIAGNOSIS ALGORITHM: RPR Ser Ql: NONREACTIVE

## 2021-07-01 MED ORDER — BENZOCAINE-MENTHOL 20-0.5 % EX AERO
1.0000 "application " | INHALATION_SPRAY | CUTANEOUS | Status: DC | PRN
  Filled 2021-07-01 (×2): qty 56

## 2021-07-01 MED ORDER — SODIUM CHLORIDE 0.9 % IV SOLN: 5.0000 10*6.[IU] | Freq: Once | INTRAVENOUS | Status: DC

## 2021-07-01 MED ORDER — ONDANSETRON HCL 4 MG PO TABS
4.0000 mg | ORAL_TABLET | ORAL | Status: DC | PRN
  Administered 2021-07-01: 4 mg via ORAL
  Filled 2021-07-01: qty 1

## 2021-07-01 MED ORDER — OXYTOCIN-SODIUM CHLORIDE 30-0.9 UT/500ML-% IV SOLN: 2.5000 [IU]/h | INTRAVENOUS | Status: DC

## 2021-07-01 MED ORDER — ONDANSETRON HCL 4 MG/2ML IJ SOLN: 4.0000 mg | Freq: Four times a day (QID) | INTRAMUSCULAR | Status: DC | PRN

## 2021-07-01 MED ORDER — TERBUTALINE SULFATE 1 MG/ML IJ SOLN: 0.2500 mg | Freq: Once | INTRAMUSCULAR | Status: DC | PRN

## 2021-07-01 MED ORDER — DIBUCAINE (PERIANAL) 1 % EX OINT
1.0000 "application " | TOPICAL_OINTMENT | CUTANEOUS | Status: DC | PRN
  Filled 2021-07-01 (×2): qty 28

## 2021-07-01 MED ORDER — OXYTOCIN-SODIUM CHLORIDE 30-0.9 UT/500ML-% IV SOLN: 1.0000 m[IU]/min | INTRAVENOUS | Status: DC

## 2021-07-01 MED ORDER — LIDOCAINE HCL (PF) 1 % IJ SOLN: 30.0000 mL | INTRAMUSCULAR | Status: DC | PRN

## 2021-07-01 MED ORDER — ACETAMINOPHEN 325 MG PO TABS
650.0000 mg | ORAL_TABLET | ORAL | Status: DC | PRN
  Administered 2021-07-01 (×5): 650 mg via ORAL
  Filled 2021-07-01 (×5): qty 2

## 2021-07-01 MED ORDER — TRANEXAMIC ACID-NACL 1000-0.7 MG/100ML-% IV SOLN
1000.0000 mg | INTRAVENOUS | Status: AC
  Administered 2021-07-01: 1000 mg via INTRAVENOUS

## 2021-07-01 MED ORDER — FERROUS SULFATE 325 (65 FE) MG PO TABS
325.0000 mg | ORAL_TABLET | Freq: Two times a day (BID) | ORAL | Status: DC
  Administered 2021-07-01 – 2021-07-02 (×3): 325 mg via ORAL
  Filled 2021-07-01 (×3): qty 1

## 2021-07-01 MED ORDER — PENICILLIN G POT IN DEXTROSE 60000 UNIT/ML IV SOLN: 3.0000 10*6.[IU] | INTRAVENOUS | Status: DC

## 2021-07-01 MED ORDER — ONDANSETRON HCL 4 MG/2ML IJ SOLN: 4.0000 mg | INTRAMUSCULAR | Status: DC | PRN

## 2021-07-01 MED ORDER — TRANEXAMIC ACID-NACL 1000-0.7 MG/100ML-% IV SOLN
INTRAVENOUS | Status: AC
  Filled 2021-07-01: qty 100

## 2021-07-01 MED ORDER — DOCUSATE SODIUM 100 MG PO CAPS
100.0000 mg | ORAL_CAPSULE | Freq: Two times a day (BID) | ORAL | Status: DC
  Administered 2021-07-02: 100 mg via ORAL
  Filled 2021-07-01: qty 1

## 2021-07-01 MED ORDER — TETANUS-DIPHTH-ACELL PERTUSSIS 5-2.5-18.5 LF-MCG/0.5 IM SUSY
0.5000 mL | PREFILLED_SYRINGE | INTRAMUSCULAR | Status: DC | PRN
  Filled 2021-07-01: qty 0.5

## 2021-07-01 MED ORDER — LACTATED RINGERS IV SOLN: INTRAVENOUS | Status: DC

## 2021-07-01 MED ORDER — IBUPROFEN 600 MG PO TABS
600.0000 mg | ORAL_TABLET | Freq: Four times a day (QID) | ORAL | Status: DC
  Administered 2021-07-01 – 2021-07-02 (×4): 600 mg via ORAL
  Filled 2021-07-01 (×4): qty 1

## 2021-07-01 MED ORDER — WITCH HAZEL-GLYCERIN EX PADS
1.0000 "application " | MEDICATED_PAD | CUTANEOUS | Status: DC | PRN
  Filled 2021-07-01 (×3): qty 100

## 2021-07-01 MED ORDER — FENTANYL CITRATE PF 50 MCG/ML IJ SOSY: 50.0000 ug | PREFILLED_SYRINGE | INTRAMUSCULAR | Status: DC | PRN

## 2021-07-01 MED ORDER — SOD CITRATE-CITRIC ACID 500-334 MG/5ML PO SOLN: 30.0000 mL | ORAL | Status: DC | PRN

## 2021-07-01 MED ORDER — DIPHENHYDRAMINE HCL 25 MG PO CAPS: 25.0000 mg | ORAL_CAPSULE | Freq: Four times a day (QID) | ORAL | Status: DC | PRN

## 2021-07-01 MED ORDER — LACTATED RINGERS IV SOLN: 500.0000 mL | INTRAVENOUS | Status: DC | PRN

## 2021-07-01 MED ORDER — ENOXAPARIN SODIUM 100 MG/ML IJ SOSY: 92.0000 mg | PREFILLED_SYRINGE | INTRAMUSCULAR | Status: DC

## 2021-07-01 MED ORDER — PRENATAL MULTIVITAMIN CH
1.0000 | ORAL_TABLET | Freq: Every day | ORAL | Status: DC
  Administered 2021-07-01: 1 via ORAL
  Filled 2021-07-01: qty 1

## 2021-07-01 MED ORDER — SIMETHICONE 80 MG PO CHEW
80.0000 mg | CHEWABLE_TABLET | ORAL | Status: DC | PRN
  Administered 2021-07-01: 80 mg via ORAL
  Filled 2021-07-01: qty 1

## 2021-07-01 MED ORDER — COCONUT OIL OIL
1.0000 "application " | TOPICAL_OIL | Status: DC | PRN
  Filled 2021-07-01: qty 120

## 2021-07-01 MED ORDER — OXYTOCIN BOLUS FROM INFUSION: 333.0000 mL | Freq: Once | INTRAVENOUS | Status: DC

## 2021-07-01 MED ORDER — ENOXAPARIN SODIUM 100 MG/ML IJ SOSY
1.0000 mg/kg | PREFILLED_SYRINGE | Freq: Two times a day (BID) | INTRAMUSCULAR | Status: DC
  Administered 2021-07-01 – 2021-07-02 (×2): 92.5 mg via SUBCUTANEOUS
  Filled 2021-07-01 (×4): qty 0.93

## 2021-07-01 MED ORDER — ACETAMINOPHEN 325 MG PO TABS: 650.0000 mg | ORAL_TABLET | ORAL | Status: DC | PRN

## 2021-07-01 NOTE — Progress Notes (Signed)
Postpartum Day  1  Subjective: no complaints, up ad lib, and tolerating PO  Doing well, no concerns. Ambulating with assistance, reports leg still feels numb post delivery. Pain managed with PO meds, tolerating regular diet. Due to void.   No fever/chills, chest pain, shortness of breath, nausea/vomiting, or leg pain. No nipple or breast pain. No headache, visual changes, or RUQ/epigastric pain.  Objective: BP 109/66 (BP Location: Left Arm)    Pulse 83    Temp 98.2 F (36.8 C) (Oral)    Resp 18    Ht 5\' 6"  (1.676 m)    Wt 92.1 kg    LMP 10/04/2020 (Exact Date)    SpO2 99%    Breastfeeding Unknown    BMI 32.77 kg/m    Physical Exam:  General: alert, cooperative, and no distress Breasts: soft/nontender CV: RRR Pulm: nl effort, CTABL Abdomen: soft, non-tender, active bowel sounds Uterine Fundus: firm Perineum: minimal edema, intact Lochia: appropriate DVT Evaluation: No evidence of DVT seen on physical exam.  Recent Labs    07/01/21 0223 07/01/21 0640  HGB 10.0* 9.1*  HCT 30.6* 27.2*  WBC 12.9* 15.5*  PLT 183 176    Assessment/Plan: 36 y.o. [id number] postpartum day # 1  -Continue routine postpartum care -Lactation consult PRN for breastfeeding  -Acute blood loss anemia - hemodynamically stable and asymptomatic; start PO ferrous sulfate BID with stool softeners  -Immunization status:   all immunizations up to date -Plan to assist to bathroom to void.  Discussed I/O catheter if unable to void.   -Will start Lovenox 12 hours PP.     Disposition: Continue inpatient postpartum care    LOS: 1 day   Minda Meo, Mallory Shirk 07/01/2021, 8:55 AM   ----- Drinda Butts  Certified Nurse Midwife [facility]

## 2021-07-01 NOTE — Anesthesia Postprocedure Evaluation (Signed)
Anesthesia Post Note  Patient: Kaitlyn Turner  Procedure(s) Performed: AN AD HOC LABOR EPIDURAL  Patient location during evaluation: Mother Baby Anesthesia Type: Epidural Level of consciousness: awake and alert Pain management: pain level controlled Vital Signs Assessment: post-procedure vital signs reviewed and stable Respiratory status: spontaneous breathing, nonlabored ventilation and respiratory function stable Cardiovascular status: stable Postop Assessment: no headache, no backache and epidural receding Anesthetic complications: no   No notable events documented.   Last Vitals:  Vitals:   07/01/21 0803 07/01/21 0820  BP: (!) 96/54 109/66  Pulse: 75 83  Resp: 18 18  Temp: 36.5 C 36.8 C  SpO2: 99%     Last Pain:  Vitals:   07/01/21 0820  TempSrc: Oral  PainSc:                  Caryl Asp

## 2021-07-01 NOTE — Discharge Summary (Signed)
Obstetrical Discharge Summary  Patient Name: Kaitlyn Turner DOB: [birth year] MRN: [id number]  Date of Admission: 06/30/2021 Date of Delivery: 07/01/21 Delivered by: Birdie Riddle Date of Discharge: 07/02/21  Primary OB: [facility]  [id number] last menstrual period was 10/04/2020 (exact date). EDC Estimated Date of Delivery: 07/10/21 Gestational Age at Delivery: [redacted]w[redacted]d   Antepartum complications:  Hx Pulmonary embolus 2011, Lovenox daily until signs of labor, plan Lovenox 1mg /kg BID 12 hrs after SVD. 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, BMI 30 Maternal mental health dx: depression, not taking Zoloft  Hx migraines Hx malignant hyperthermia, anesthesia consult complete 03/2021 Hx Covid in 3rd trimester  Admitting Diagnosis: Advanced dilation   Secondary Diagnosis: Patient Active Problem List   Diagnosis Date Noted   NSVD (normal spontaneous vaginal delivery) 07/01/2021   Postpartum hemorrhage, third stage, delivered 07/01/2021   Personal history of PE (pulmonary embolism) 07/01/2021   Nausea & vomiting 04/23/2021   Overweight BMI=28.8 01/30/2020   H/O alcohol abuse--DUI 2019 01/30/2020   History of pulmonary embolism 01/30/2020   Chest pain 01/06/2019   MDD (major depressive disorder), recurrent episode ([place]) 08/21/2018   Suicidal ideation 08/20/2018   Family disruption due to divorce or legal separation 06/03/2015   Adjustment disorder with mixed anxiety and depressed mood 06/03/2015   Family history of alcoholism 01/08/2015    Augmentation: AROM and Pitocin Complications: [id number]  Intrapartum complications/course:  Delivery Type: spontaneous vaginal delivery Anesthesia: epidural Placenta: spontaneous Laceration: none Episiotomy: none Newborn Data: Live born female  Birth Weight: 8 lb 1.5 oz (3670 g) APGAR: 8, 9  Newborn Delivery   Birth date/time: 07/01/2021 00:59:00 Delivery type: Vaginal, Spontaneous      36 y.o. [id number]  at [redacted]w[redacted]d presenting with advanced dilation, AROM with clear fluid.  She progressed to complete and pushed over an intact perineum and delivered the fetal head, followed promptly by the shoulders. She was in control the whole time, and the baby placed on the maternal abdomen. Delayed cord clamping and the FOB cut the baby's cord, while the baby was skin to skin. The placenta was manually delivered by Dr. Glennon Mac. No lacerations noted. Mom and baby tolerated the procedure well.   Postpartum Procedures: Lovenox started at 12 hrs PP per MFM - H/o PE  Post partum course:  Patient had an uncomplicated postpartum course.  By time of discharge on PPD#2, her pain was controlled on oral pain medications; she had appropriate lochia and was ambulating, voiding without difficulty and tolerating regular diet.  She was deemed stable for discharge to home.    Discharge Physical Exam:  BP 108/67 (BP Location: Left Arm)    Pulse 70    Temp 97.9 F (36.6 C) (Oral)    Resp 20    Ht 5\' 6"  (1.676 m)    Wt 92.1 kg    LMP 10/04/2020 (Exact Date)    SpO2 100%    Breastfeeding Unknown    BMI 32.77 kg/m   General: alert and no distress Pulm: normal respiratory effort Lochia: appropriate Abdomen: soft, NT Uterine Fundus: firm, below umbilicus Extremities: No evidence of DVT seen on physical exam. No lower extremity edema. EdinburghFlavia Shipper Postnatal Depression Scale Screening Tool 07/02/2021  I have been able to laugh and see the funny side of things. 0  I have looked forward with enjoyment to things. 0  I have blamed myself unnecessarily when things went wrong. 0  I have been anxious or worried for no good reason.  0  I have felt scared or panicky for no good reason. 0  Things have been getting on top of me. 0  I have been so unhappy that I have had difficulty sleeping. 0  I have felt sad or miserable. 0  I have been so unhappy that I have been crying. 0  The thought of harming myself has occurred to me. 0   Edinburgh Postnatal Depression Scale Total 0     Labs: CBC Latest Ref Rng & Units 07/02/2021 07/01/2021 07/01/2021  WBC 4.0 - 10.5 K/uL 10.7(H) 11.9(H) 15.5(H)  Hemoglobin 12.0 - 15.0 g/dL 7.9(L) 8.4(L) 9.1(L)  Hematocrit 36.0 - 46.0 % 23.5(L) 24.8(L) 27.2(L)  Platelets 150 - 400 K/uL 150 168 176   O POS Hemoglobin  Date Value Ref Range Status  07/02/2021 7.9 (L) 12.0 - 15.0 g/dL Final   HGB  Date Value Ref Range Status  05/29/2013 10.4 (L) 12.0 - 16.0 g/dL Final   HCT  Date Value Ref Range Status  07/02/2021 23.5 (L) 36.0 - 46.0 % Final  05/28/2013 34.4 (L) 35.0 - 47.0 % Final    Disposition: stable, discharge to home Baby Feeding: breastmilk Baby Disposition: home with mom  Contraception: None  Prenatal Labs:  Blood type/Rh O Pos  Antibody screen neg  Rubella Immune  Varicella Immune  RPR NR  HBsAg Neg  HIV NR  GC neg  Chlamydia neg  Genetic screening negative  1 hour GTT 116  3 hour GTT    GBS Pos   Rh Immune globulin given: n/a Rubella vaccine given: Immune Varicella vaccine given: Immune Tdap vaccine given in AP or PP setting: 04/15/21 Flu vaccine given in AP or PP setting: declined in AP setting  Plan: Troy Sine Tarleton was discharged to home in good condition.  Discharge Instructions: Per After Visit Summary. Activity: Advance as tolerated. Pelvic rest for 6 weeks.   Diet: Regular Discharge Medications: Allergies as of 07/02/2021       Reactions   Stadol [butorphanol] Itching   Per Harden Mo, RN   Keflex [cephalexin] Hives   Other    General anesthesia - reaction was hypothermia   Pork-derived Products Hives, Itching   Patient is allergic to pork   Toradol [ketorolac Tromethamine] Hives        Medication List     STOP taking these medications    fluconazole 150 MG tablet Commonly known as: Diflucan   ondansetron 4 MG disintegrating tablet Commonly known as: ZOFRAN-ODT       TAKE these medications    acetaminophen 325 MG  tablet Commonly known as: Tylenol Take 2 tablets (650 mg total) by mouth every 4 (four) hours as needed (for pain scale < 4). What changed:  when to take this reasons to take this   benzocaine-Menthol 20-0.5 % Aero Commonly known as: DERMOPLAST Apply 1 application topically as needed for irritation (perineal discomfort).   coconut oil Oil Apply 1 application topically as needed.   dibucaine 1 % Oint Commonly known as: NUPERCAINAL Place 1 application rectally as needed for hemorrhoids.   docusate sodium 100 MG capsule Commonly known as: COLACE Take 1 capsule (100 mg total) by mouth 2 (two) times daily.   enoxaparin 100 MG/ML injection Commonly known as: LOVENOX Inject 0.925 mLs (92.5 mg total) into the skin every 12 (twelve) hours. What changed:  medication strength how much to take when to take this   ferrous sulfate 325 (65 FE) MG tablet Take 1 tablet (325 mg  total) by mouth 2 (two) times daily with a meal.   ibuprofen 600 MG tablet Commonly known as: ADVIL Take 1 tablet (600 mg total) by mouth every 6 (six) hours.   prenatal multivitamin Tabs tablet Take 1 tablet by mouth daily at 12 noon.   simethicone 80 MG chewable tablet Commonly known as: MYLICON Chew 1 tablet (80 mg total) by mouth as needed for flatulence.   witch hazel-glycerin pad Commonly known as: TUCKS Apply 1 application topically as needed for hemorrhoids.       Outpatient follow up:   Follow-up [facility] Follow up in 2 week(s).   Why: mood check Contact information: [place] [phone]        Linda Hedges, CNM Follow up in 6 week(s).   Specialty: Certified Nurse Midwife Why: 6 weeks pp visit Contact information: [place] [address] [phone]                 Signed: Avelino Leeds [facility]  07/02/2021 10:29 AM

## 2021-07-01 NOTE — Progress Notes (Signed)
Called in to see patient due to cord evulsion after delivery of baby. Placenta still in place at 45 minutes after birth, roughly when I arrived.  Estimated blood loss at that time was roughly 1,100 mL.  She was a bit hypotensive with mild tachycardia. She had IV fluids going. She had not yet had pitocin started.  I gowned and gloved.  The placenta was mostly in the vagina, but still attached inside the uterus.  A manual sweep of the uterus allowed me to manually extract the uterus.  I did a second pass with my hands and brought out a small amount of tissue more.  A third and final pass did not reveal any more tissue. A bedside ultrasound revealed a thin endometrial stripe apart from the right upper horn of the uterus.  A Banjo curettge was used under ultrasound guidance to get any tissue that might be there. Two passes made with the Banjo curette and there was a thin stripe in this area after this.   Pitocin was already started at this point and her fundus was firm.  Lochia at expected level.  CBC drawn, IVF bolus given.  QBL at end of procedure was about 1,500 mL. Continue to monitor closely She had already received TXA.    Prentice Docker, MD, [facility] 07/01/2021 2:33 AM

## 2021-07-01 NOTE — Lactation Note (Signed)
This note was copied from a baby's chart. Lactation Consultation Note  Patient Name: Girl Tesslyn Baumert RCVEL'F Date: 07/01/2021 Reason for consult: Initial assessment;Early term 37-38.6wks Age:36 hours  Initial lactation visit. Mom is P4, SVD 11 hours ago.  Maternal Data Has patient been taught Hand Expression?: Yes Does the patient have breastfeeding experience prior to this delivery?: Yes How long did the patient breastfeed?: 11 months+ (with all previous children)  Feeding Mother's Current Feeding Choice: Breast Milk Mom plans exclusive breastfeeding and has lengthy breastfeeding experience although the other children are 35yrs and older.  LATCH Score-Feeding was attempted however baby was sleepy and also has mucous in mouth.                    Lactation Tools Discussed/Used   Mom was provided with comfort gels. [employer] student educated mom on use, cleaning and storage. Lanolin is only option available, but mom was given guidance on using coconut oil.  Interventions Interventions: Breast feeding basics reviewed;Hand express;Coconut oil;Comfort gels;Education  Reviewed normal newborn feeding patterns and behaviors, impact of baby being spitty may have on desire to eat, 8 or more attempts in first 24 hours w/ reassurance that baby may not eat every time, and output expectations.  Discharge    Consult Status Consult Status: PRN  Encouraged to call as needed to see/assist with breastfeeding or with questions and concerns.   Lavonia Drafts 07/01/2021, 12:27 PM

## 2021-07-02 LAB — CBC
HCT: 23.5 % — ABNORMAL LOW (ref 36.0–46.0)
Hemoglobin: 7.9 g/dL — ABNORMAL LOW (ref 12.0–15.0)
MCH: 30.7 pg (ref 26.0–34.0)
MCHC: 33.6 g/dL (ref 30.0–36.0)
MCV: 91.4 fL (ref 80.0–100.0)
Platelets: 150 10*3/uL (ref 150–400)
RBC: 2.57 MIL/uL — ABNORMAL LOW (ref 3.87–5.11)
RDW: 13.6 % (ref 11.5–15.5)
WBC: 10.7 10*3/uL — ABNORMAL HIGH (ref 4.0–10.5)
nRBC: 0 % (ref 0.0–0.2)

## 2021-07-02 LAB — SURGICAL PATHOLOGY

## 2021-07-02 MED ORDER — ENOXAPARIN SODIUM 100 MG/ML IJ SOSY: 1.0000 mg/kg | PREFILLED_SYRINGE | Freq: Two times a day (BID) | INTRAMUSCULAR | 0 refills | Status: DC

## 2021-07-02 MED ORDER — PRENATAL MULTIVITAMIN CH: 1.0000 | ORAL_TABLET | Freq: Every day | ORAL | Status: DC

## 2021-07-02 MED ORDER — DOCUSATE SODIUM 100 MG PO CAPS: 100.0000 mg | ORAL_CAPSULE | Freq: Two times a day (BID) | ORAL | 0 refills | Status: DC

## 2021-07-02 MED ORDER — FERROUS SULFATE 325 (65 FE) MG PO TABS: 325.0000 mg | ORAL_TABLET | Freq: Two times a day (BID) | ORAL | 3 refills | Status: DC

## 2021-07-02 MED ORDER — DIBUCAINE (PERIANAL) 1 % EX OINT: 1.0000 "application " | TOPICAL_OINTMENT | CUTANEOUS | Status: DC | PRN

## 2021-07-02 MED ORDER — SIMETHICONE 80 MG PO CHEW: 80.0000 mg | CHEWABLE_TABLET | ORAL | 0 refills | Status: DC | PRN

## 2021-07-02 MED ORDER — IBUPROFEN 600 MG PO TABS: 600.0000 mg | ORAL_TABLET | Freq: Four times a day (QID) | ORAL | 0 refills | Status: DC

## 2021-07-02 MED ORDER — BENZOCAINE-MENTHOL 20-0.5 % EX AERO: 1.0000 "application " | INHALATION_SPRAY | CUTANEOUS | Status: DC | PRN

## 2021-07-02 MED ORDER — ACETAMINOPHEN 325 MG PO TABS: 650.0000 mg | ORAL_TABLET | ORAL | Status: DC | PRN

## 2021-07-02 MED ORDER — COCONUT OIL OIL: 1.0000 "application " | TOPICAL_OIL | 0 refills | Status: DC | PRN

## 2021-07-02 MED ORDER — WITCH HAZEL-GLYCERIN EX PADS: 1.0000 "application " | MEDICATED_PAD | CUTANEOUS | 12 refills | Status: DC | PRN

## 2021-07-02 MED ORDER — IRON SUCROSE 20 MG/ML IV SOLN
300.0000 mg | Freq: Once | INTRAVENOUS | Status: AC
  Administered 2021-07-02: 300 mg via INTRAVENOUS
  Filled 2021-07-02: qty 300

## 2021-07-02 NOTE — Progress Notes (Signed)
Pt discharged with infant. Discharge instructions, prescriptions, and follow up appointments given to and reviewed with patient. Pt verbalized understanding. To be escorted out by auxillary.

## 2021-07-04 ENCOUNTER — Other Ambulatory Visit: Payer: Self-pay

## 2021-07-04 ENCOUNTER — Ambulatory Visit
Admission: EM | Admit: 2021-07-04 | Discharge: 2021-07-04 | Disposition: A | Discharge status: Home / Self Care | Payer: Medicaid Other | Attending: Internal Medicine | Admitting: Internal Medicine

## 2021-07-04 ENCOUNTER — Encounter: Payer: Self-pay | Admitting: Internal Medicine

## 2021-07-04 DIAGNOSIS — M79601 Pain in right arm: Secondary | ICD-10-CM | POA: Diagnosis not present

## 2021-07-04 DIAGNOSIS — L03113 Cellulitis of right upper limb: Secondary | ICD-10-CM

## 2021-07-04 MED ORDER — CLINDAMYCIN HCL 300 MG PO CAPS: 300.0000 mg | ORAL_CAPSULE | Freq: Three times a day (TID) | ORAL | 0 refills | Status: DC

## 2021-07-04 NOTE — Discharge Instructions (Addendum)
Apply dry heat on your tender area for 15-20 minutes every 2 hours for the next 3 days. Follow up with your primary care doctor If the pain gets worse and more swollen, or you get a streak running to your upper arm towards your armpit, you need to go to the ER.

## 2021-07-04 NOTE — ED Provider Notes (Signed)
[facility]    CSN: [id number] Arrival date & time: 07/04/21  0905      History   Chief Complaint Chief Complaint  Patient presents with   Arm Pain    Right forearm    HPI Kaitlyn Turner is a 36 y.o. female presents with R forearm pain and redness x 1 day where the IV site was. She had a complicated delivery and was admitted for 2 days. She deliver on 2/15 She was d/c 2/16 She is currently breast feeding    Past Medical History:  Diagnosis Date   Anemia    Family history of alcoholism 01/08/2015   Per [employer]   Hypotension    Malignant hyperthermia    Mental disorder    Panic attacks   Pulmonary embolism ([place])    Vaginal Pap smear, abnormal     Patient Active Problem List   Diagnosis Date Noted   NSVD (normal spontaneous vaginal delivery) 07/01/2021   Postpartum hemorrhage, third stage, delivered 07/01/2021   Personal history of PE (pulmonary embolism) 07/01/2021   Nausea & vomiting 04/23/2021   Overweight BMI=28.8 01/30/2020   H/O alcohol abuse--DUI 2019 01/30/2020   History of pulmonary embolism 01/30/2020   Chest pain 01/06/2019   MDD (major depressive disorder), recurrent episode ([place]) 08/21/2018   Suicidal ideation 08/20/2018   Family disruption due to divorce or legal separation 06/03/2015   Adjustment disorder with mixed anxiety and depressed mood 06/03/2015   Family history of alcoholism 01/08/2015    Past Surgical History:  Procedure Laterality Date   DENTAL SURGERY      OB History     Gravida  9   Para  4   Term  4   Preterm  0   AB  5   Living  4      SAB  3   IAB  2   Ectopic  0   Multiple  0   Live Births  4            Home Medications    Prior to Admission medications   Medication Sig Start Date End Date Taking? Authorizing Provider  acetaminophen (TYLENOL) 325 MG tablet Take 2 tablets (650 mg total) by mouth every 4 (four) hours as needed (for pain scale < 4). 07/02/21  Yes Avelino Leeds  Lucy Lorena, CNM  clindamycin (CLEOCIN) 300 MG capsule Take 1 capsule (300 mg total) by mouth 3 (three) times daily. 07/04/21  Yes Rodriguez-Southworth, Sunday Spillers, PA-C  ferrous sulfate 325 (65 FE) MG tablet Take 1 tablet (325 mg total) by mouth 2 (two) times daily with a meal. 07/02/21  Yes Bobette Mo, Burt Ek, CNM  Prenatal Vit-Fe Fumarate-FA (PRENATAL MULTIVITAMIN) TABS tablet Take 1 tablet by mouth daily at 12 noon. 07/02/21  Yes Avelino Leeds Arlyn Leak, CNM  simethicone (MYLICON) 80 MG chewable tablet Chew 1 tablet (80 mg total) by mouth as needed for flatulence. 07/02/21  Yes Avelino Leeds Arlyn Leak, CNM  witch hazel-glycerin (TUCKS) pad Apply 1 application topically as needed for hemorrhoids. 07/02/21  Yes Dickerson, Felicia Lucy Lorena, CNM  benzocaine-Menthol (DERMOPLAST) 20-0.5 % AERO Apply 1 application topically as needed for irritation (perineal discomfort). 3/81/01   Ed Blalock, CNM  coconut oil OIL Apply 1 application topically as needed. 7/51/02   Ed Blalock, CNM  dibucaine (NUPERCAINAL) 1 % OINT Place 1 application rectally as needed for hemorrhoids. 5/85/27   Ed Blalock, CNM  docusate sodium (COLACE)  100 MG capsule Take 1 capsule (100 mg total) by mouth 2 (two) times daily. 2/99/24   Ed Blalock, CNM  ibuprofen (ADVIL) 600 MG tablet Take 1 tablet (600 mg total) by mouth every 6 (six) hours. 2/68/34   Avelino Leeds Lucy Lorena, CNM  COLCRYS 0.6 MG tablet Take 1 tablet (0.6 mg total) by mouth daily. Patient not taking: Reported on 01/30/2020 06/26/19 05/30/20  Kate Sable, MD  pantoprazole (PROTONIX) 40 MG tablet Take 1 tablet (40 mg total) by mouth daily. For 1 month. Patient not taking: Reported on 01/30/2020 06/21/19 05/30/20  Kate Sable, MD  traZODone (DESYREL) 50 MG tablet Take 1 tablet (50 mg total) by mouth at bedtime and may repeat dose one time if needed. For sleep  08/23/18 01/06/19  Connye Burkitt, NP    Family History Family History  Problem Relation Age of Onset   Cancer Mother        Unsure   Hypertension Father    Hypertension Maternal Grandmother    Heart disease Maternal Grandmother     Social History Social History   Tobacco Use   Smoking status: Never   Smokeless tobacco: Never  Vaping Use   Vaping Use: Never used  Substance Use Topics   Alcohol use: Not Currently    Comment: occasionally   Drug use: No     Allergies   Stadol [butorphanol], Keflex [cephalexin], Other, Pork-derived products, and Toradol [ketorolac tromethamine]   Review of Systems Review of Systems  Constitutional:  Positive for fatigue. Negative for appetite change, chills and fever.  Musculoskeletal:        R forearm pain, swelling and redness  Skin:  Positive for color change. Negative for wound.  Hematological:  Negative for adenopathy.    Physical Exam Triage Vital Signs ED Triage Vitals  Enc Vitals Group     BP 07/04/21 0922 126/82     Pulse Rate 07/04/21 0922 82     Resp 07/04/21 0922 18     Temp 07/04/21 0922 99 F (37.2 C)     Temp Source 07/04/21 0922 Oral     SpO2 07/04/21 0922 96 %     Weight 07/04/21 0919 203 lb 0.7 oz (92.1 kg)     Height 07/04/21 0919 5\' 6"  (1.676 m)     Head Circumference --      Peak Flow --      Pain Score 07/04/21 0919 8     Pain Loc --      Pain Edu? --      Excl. in [place]? --    No data found.  Updated Vital Signs BP 126/82 (BP Location: Left Arm)    Pulse 82    Temp 99 F (37.2 C) (Oral)    Resp 18    Ht 5\' 6"  (1.676 m)    Wt 203 lb 0.7 oz (92.1 kg)    SpO2 96%    Breastfeeding Yes    BMI 32.77 kg/m   Visual Acuity Right Eye Distance:   Left Eye Distance:   Bilateral Distance:    Right Eye Near:   Left Eye Near:    Bilateral Near:     Physical Exam Vitals and nursing note reviewed.  Constitutional:      General: She is not in acute distress.    Appearance: She is not toxic-appearing.   HENT:     Right Ear: External ear normal.     Left Ear: External ear normal.  Eyes:     General: No scleral icterus.    Conjunctiva/sclera: Conjunctivae normal.  Cardiovascular:     Pulses: Normal pulses.  Pulmonary:     Effort: Pulmonary effort is normal.  Musculoskeletal:        General: Normal range of motion.     Cervical back: Neck supple.  Lymphadenopathy:     Upper Body:     Right upper body: No axillary adenopathy.  Skin:    Capillary Refill: Capillary refill takes less than 2 seconds.     Findings: Erythema present. No rash.     Comments: R  dorsal forearm with swelling and erythema and warmth of  8 cm x 6 cm, and no streaking to upper arm noted. Is indurated right below the puncture area where the IV was, but I cant tell  that is a linear induration. It is not fluctuant like an abscess. She is very guarded from the pain.   Neurological:     Mental Status: She is alert and oriented to person, place, and time.     Gait: Gait normal.  Psychiatric:        Mood and Affect: Mood normal.        Behavior: Behavior normal.        Thought Content: Thought content normal.        Judgment: Judgment normal.     UC Treatments / Results  Labs (all labs ordered are listed, but only abnormal results are displayed) Labs Reviewed - No data to display  EKG   Radiology No results found.  Procedures Procedures (including critical care time)  Medications Ordered in UC Medications - No data to display  Initial Impression / Assessment and Plan / UC Course  I have reviewed the triage vital signs and the nursing notes. R forearm  cellulitis with possible phlebitis She was placed on Clindamycin as noted, and advised to apply heat on this area. See instructions.      Final Clinical Impressions(s) / UC Diagnoses   Final diagnoses:  Right arm pain  Cellulitis of right upper extremity     Discharge Instructions      Apply dry heat on your tender area for 15-20 minutes  every 2 hours for the next 3 days. Follow up with your primary care doctor If the pain gets worse and more swollen, or you get a streak running to your upper arm towards your armpit, you need to go to the ER.      ED Prescriptions     Medication Sig Dispense Auth. Provider   clindamycin (CLEOCIN) 300 MG capsule Take 1 capsule (300 mg total) by mouth 3 (three) times daily. 21 capsule Rodriguez-Southworth, Sunday Spillers, PA-C      PDMP not reviewed this encounter.   Shelby Mattocks, [place] 07/04/21 [phone]

## 2021-07-04 NOTE — ED Triage Notes (Signed)
Pt c/o right forearm pain, pain,a nd redness. She recently had a hospitalization from giving birth. She states the area that is bothering her is where her IV was. She was discharged from the hospital on 07/02/21. Pt is breastfeeding.

## 2021-07-15 DIAGNOSIS — Z419 Encounter for procedure for purposes other than remedying health state, unspecified: Secondary | ICD-10-CM | POA: Diagnosis not present

## 2021-08-15 DIAGNOSIS — Z419 Encounter for procedure for purposes other than remedying health state, unspecified: Secondary | ICD-10-CM | POA: Diagnosis not present

## 2021-08-28 IMAGING — US US OB COMP LESS 14 WK
1 series · 14 of 28 positions shown · non-contrast
Comparison: None.

CLINICAL DATA: 34-year-old pregnant female with vaginal bleeding.
LMP: 10/04/2020 corresponding to an estimated gestational age of 12
weeks, 5 days. The estimated gestational age based on the first
ultrasound is 13 weeks, 0 days.

EXAM:
OBSTETRIC <14 WK ULTRASOUND
TECHNIQUE: Transabdominal ultrasound was performed for evaluation of the
gestation as well as the maternal uterus and adnexal regions.

[Series 1: us ob comp less 14 wks · 14 of 54 slices shown]
[im 2/54]
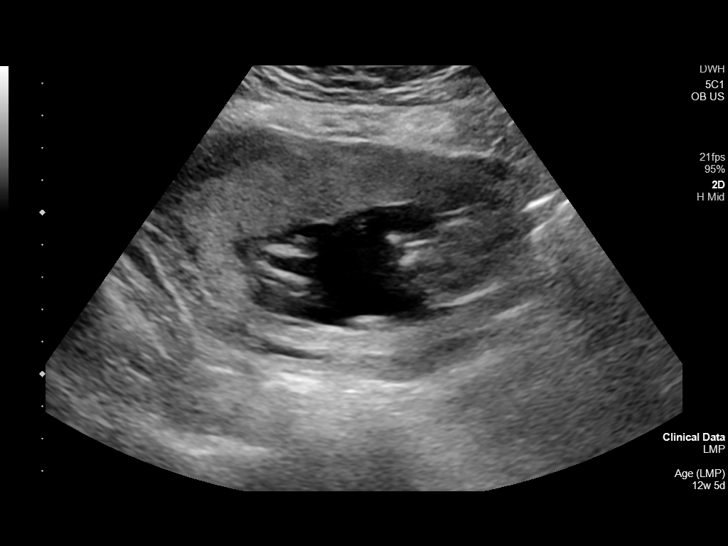
[im 6/54]
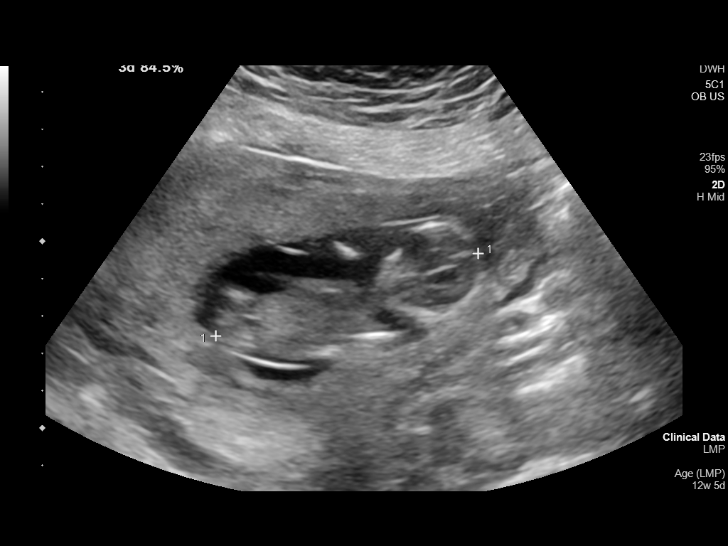
[im 10/54]
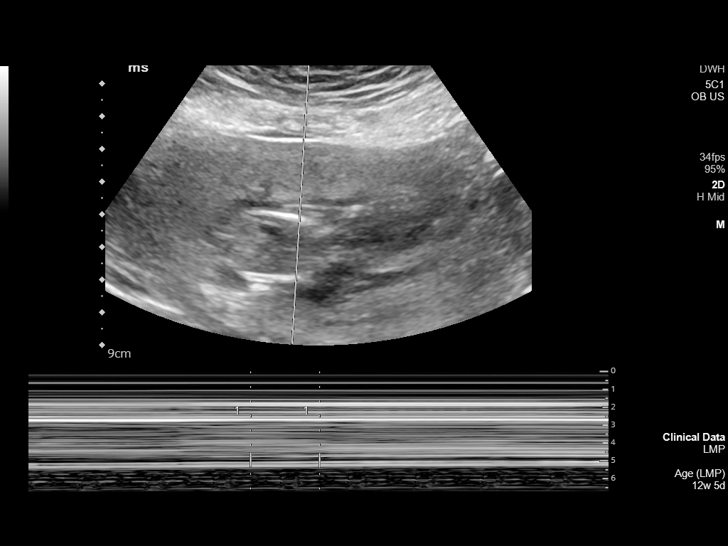
[im 14/54]
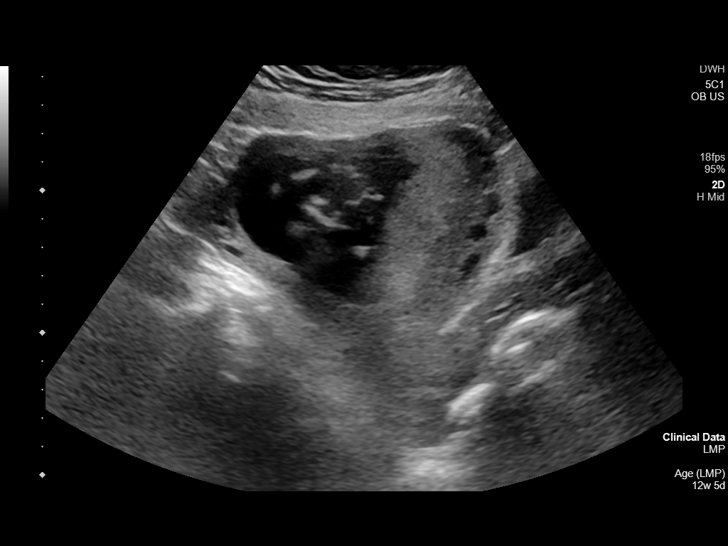
[im 18/54]
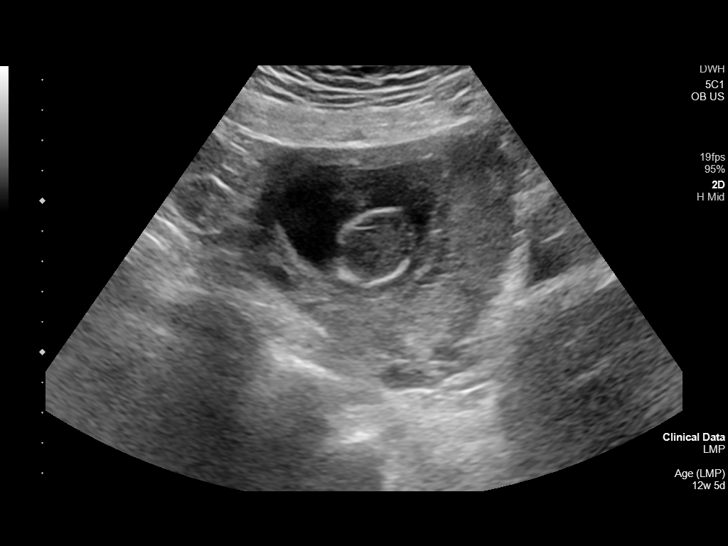
[im 22/54]
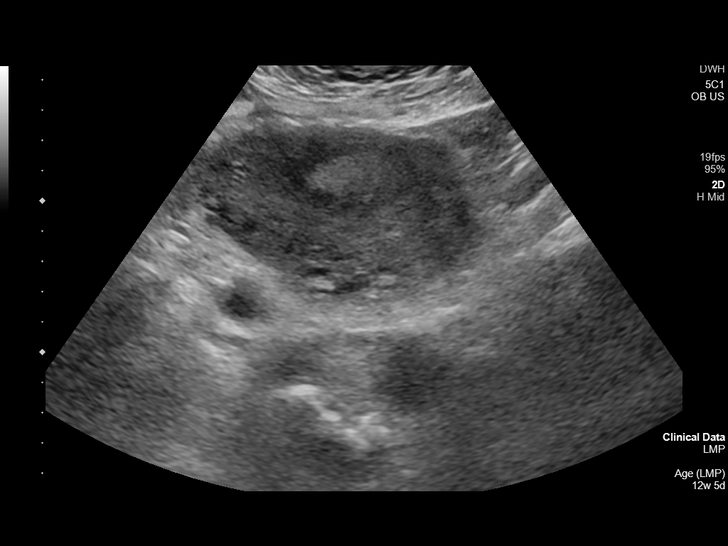
[im 26/54]
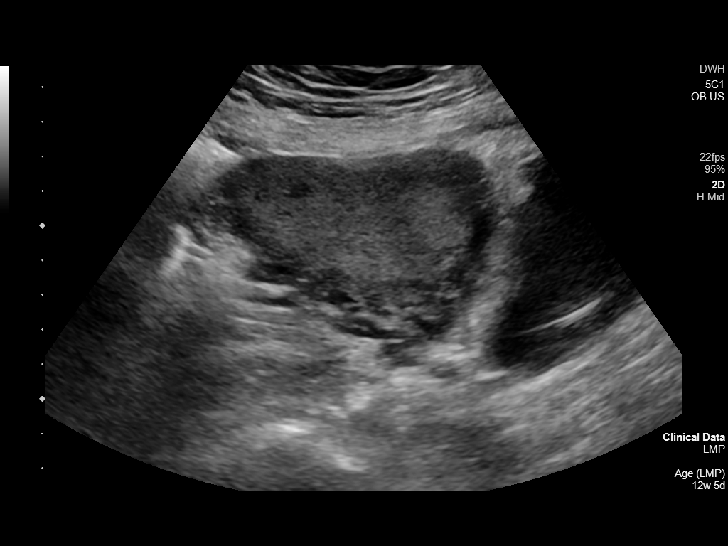
[im 30/54]
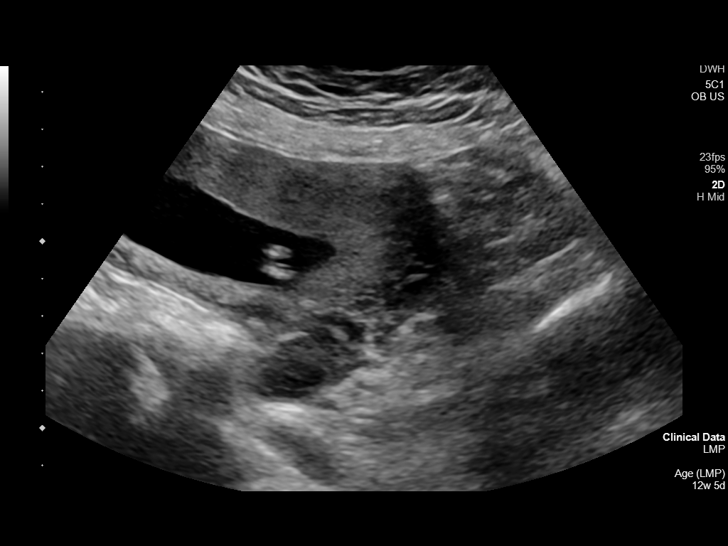
[im 34/54]
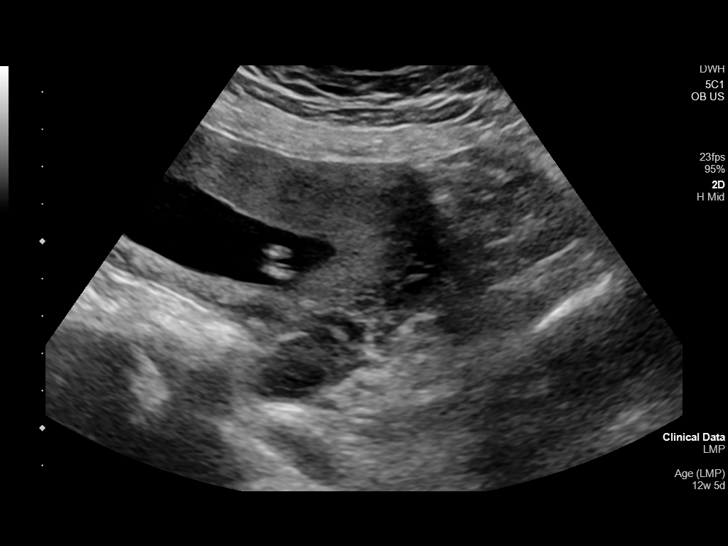
[im 38/54]
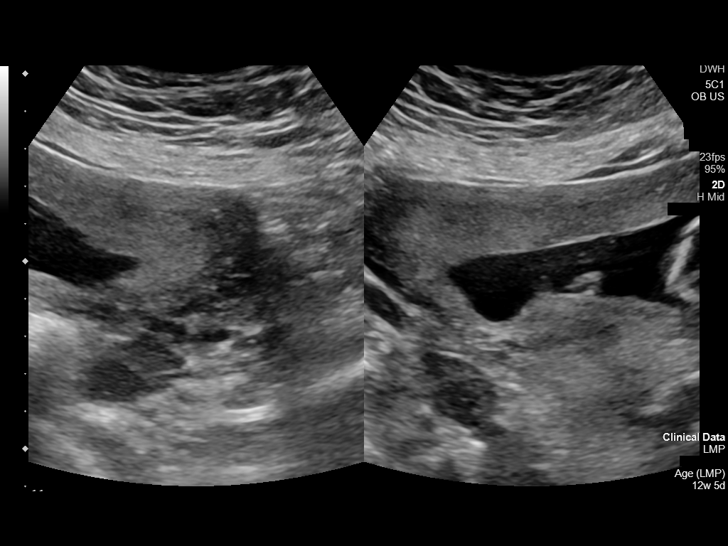
[im 42/54]
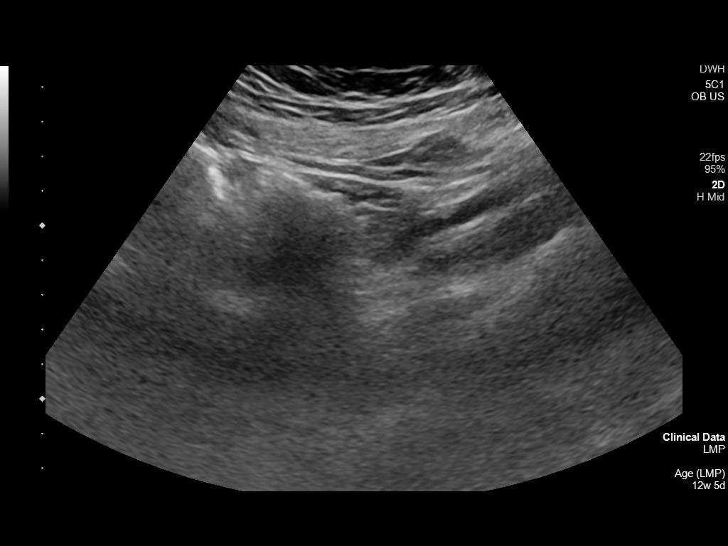
[im 46/54]
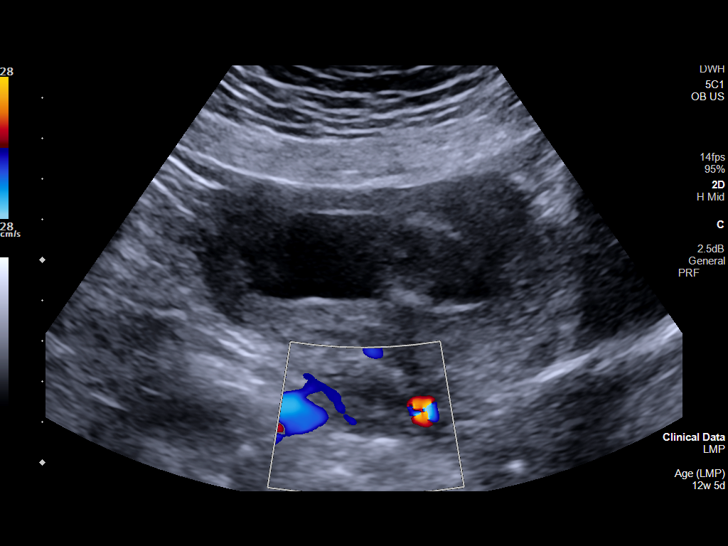
[im 50/54]
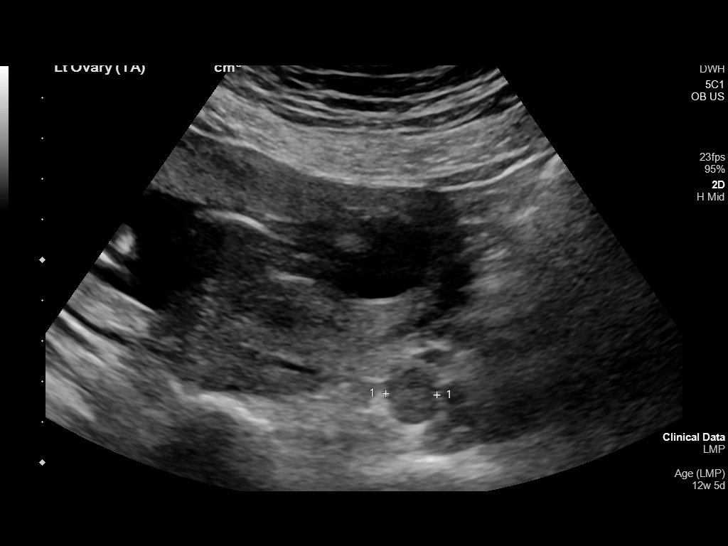
[im 54/54]
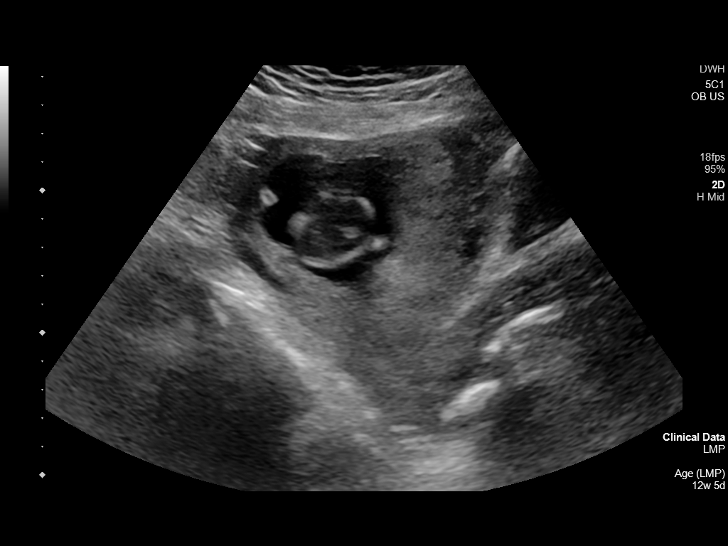

[14 of 28 positions shown; findings below may reference images not displayed]

FINDINGS: Intrauterine gestational sac: Single intrauterine gestational sac.

Yolk sac:  Not seen

Embryo:  Present

Cardiac Activity: Detected

Heart Rate: 148 bpm

CRL: 73 mm   13 w 3 d                  US EDC: 07/06/2021

Subchorionic hemorrhage:  None visualized.

Maternal uterus/adnexae: The ovaries are unremarkable.
IMPRESSION: Single live intrauterine pregnancy with an estimated gestational age
of 13 weeks, 3 days.

## 2021-09-14 ENCOUNTER — Ambulatory Visit: Payer: Medicaid Other | Admitting: Physical Therapy

## 2021-09-14 DIAGNOSIS — Z419 Encounter for procedure for purposes other than remedying health state, unspecified: Secondary | ICD-10-CM | POA: Diagnosis not present

## 2021-09-16 ENCOUNTER — Ambulatory Visit: Payer: Medicaid Other | Admitting: Physical Therapy

## 2021-09-21 ENCOUNTER — Ambulatory Visit: Payer: Medicaid Other | Attending: Certified Nurse Midwife | Admitting: Physical Therapy

## 2021-09-21 ENCOUNTER — Encounter: Payer: Self-pay | Admitting: Physical Therapy

## 2021-09-21 DIAGNOSIS — M6281 Muscle weakness (generalized): Secondary | ICD-10-CM | POA: Diagnosis not present

## 2021-09-21 DIAGNOSIS — R278 Other lack of coordination: Secondary | ICD-10-CM | POA: Diagnosis not present

## 2021-09-21 DIAGNOSIS — R293 Abnormal posture: Secondary | ICD-10-CM | POA: Insufficient documentation

## 2021-09-21 NOTE — Therapy (Signed)
?OUTPATIENT PHYSICAL THERAPY FEMALE PELVIC EVALUATION ? ? ?Patient Name: Kaitlyn Turner ?MRN: [id number] ?DOB:01-09-1986, 36 y.o., female ?Today's Date: 09/21/2021 ? ? PT End of Session - 09/21/21 1410   ? ? Visit Number 1   ? Number of Visits 12   ? Date for PT Re-Evaluation 12/14/21   ? Authorization Type [employer]   ? Authorization Time Period IE 09/21/2021   ? PT Start Time 1415   ? PT Stop Time 1455   ? PT Time Calculation (min) 40 min   ? Activity Tolerance Patient tolerated treatment well   ? Behavior During Therapy [facility] for tasks assessed/performed   ? ?  ?  ? ?  ? ? ?Past Medical History:  ?Diagnosis Date  ? Anemia   ? Family history of alcoholism 01/08/2015  ? Per Centricity  ? Hypotension   ? Malignant hyperthermia   ? Mental disorder   ? Panic attacks  ? Pulmonary embolism ([place])   ? Vaginal Pap smear, abnormal   ? ?Past Surgical History:  ?Procedure Laterality Date  ? DENTAL SURGERY    ? ?Patient Active Problem List  ? Diagnosis Date Noted  ? NSVD (normal spontaneous vaginal delivery) 07/01/2021  ? Postpartum hemorrhage, third stage, delivered 07/01/2021  ? Personal history of PE (pulmonary embolism) 07/01/2021  ? History of malignant hyperthermia 06/17/2021  ? Nausea & vomiting 04/23/2021  ? Low grade squamous intraepithelial lesion on cytologic smear of cervix (LGSIL) 12/23/2020  ? Overweight BMI=28.8 01/30/2020  ? H/O alcohol abuse--DUI 2019 01/30/2020  ? History of pulmonary embolism 01/30/2020  ? Chest pain 01/06/2019  ? MDD (major depressive disorder), recurrent episode ([place]) 08/21/2018  ? Suicidal ideation 08/20/2018  ? Family disruption due to divorce or legal separation 06/03/2015  ? Adjustment disorder with mixed anxiety and depressed mood 06/03/2015  ? Family history of alcoholism 01/08/2015  ? ? ?PCP: Pcp, No ? ?REFERRING PROVIDER: Linda Hedges, CNM ? ?REFERRING DIAG: R32 (ICD-10-CM) - Unspecified urinary incontinence  ? ?THERAPY DIAG:  ?Other lack of coordination - Plan: PT plan of care  cert/re-cert ? ?Muscle weakness (generalized) - Plan: PT plan of care cert/re-cert ? ?Abnormal posture - Plan: PT plan of care cert/re-cert ? ?ONSET DATE: DoD 07/01/2021 ? ?SUBJECTIVE:                                                                                                                                                                                          ? ?Chief complaint: Patient reports that within a week after delivering most recent child, she had increased UI. Patient notes lack of bladder control in the presence of urge. Patient  notes now it is more controllable and she can make it to the restroom but not to the toilet. Patient also reports that she has increased pain with sexual activity (deep thrusting). Patient notes that she also continues to have pain suprapubically. Patient notes with use of versaclimber she has pain at SIJ, B, as well as deadlifts. Patient notes limitations with cleaning, laundry, driving, cooking dinner.  ? ? ?Pain: ?Are you having pain? No ?Current NPRS scale: 0/10 ?Pain location: Vaginal and Suprapubically ? ?Pain type: "hurts", "instant pain", "stab" ?Pain description: intermittent  ? ?Aggravating factors: sexual activity, hip hinge (lumbopelvic), lifting (lumbopelvic), versaclimber (suprapubic and SIJ) ?Relieving factors: rest, change positions ? ?Precautions: None ? ?Falls: Has patient fallen in last 6 months? No ? ?Occupation/Current Activities: versaclimber, weightlifting, former bikini competitor ? ?PLOF: Independent ? ?Patient stated goals: "somewhat back to normal: not having the peeing problem, I would like to be able to bend over and workout without pain, and then definitely not hurting with sex" ? ?Pertinent History: ?Scoliosis Negative. ?Pulmonary disease/dysfunction Positive for history of PE. ?Surgical history: Negative. ? ?"Third Stage: ?Placenta manually removed by Dr. Glennon [facility] at [phone] - see Dr. Marisue [place] separate note ?Placenta disposition:  Pathology ?Uterine tone firm / bleeding min after placenta removed ?TXA was given for heavy bleeding prior to placenta removal with total blood loss 1515m ?IV pitocin given for hemorrhage prophylaxis" (hospital note) ?"Interval Hx:  ?-Concerns: abdominal pain when she presses hard just to the right and below her umbilicus ?- also notes some weakness in her left leg with a little edema - will allow more time to heal but consider Neuro referral if it doesn't resolve ?- no active vaginal discharge or bleeding ?- pain (see above) ?- urinary incontinence (noted right after delivery but none now)  ?- no fecal incontinence  ?- n/a incision" ? ?Obstetrical History: ?G9P4 ?Deliveries: SVD ?Tearing/Episiotomy: n/a with most recent delivery *DoD 07/01/2021 ? ?Gynecological History: ?Hysterectomy: No  ?Endometriosis: Negative ?Pelvic Organ Prolapse: Negative ?Last Menstrual Period:  ?Pain with exam: No ?Heaviness/pressure: No ? ?Urinary History: ?Frequency of urination: every 2 hours ?Incontinence: Urge to void, Coughing, Sneezing, Lifting, and post-void micturition ? Onset: 07/01/2021 Amount: Min/Mod ?Protective undergarments: Yes   ?Type: pantyliners; Number used/day: 3 ?Fluid Intake: 6 x 16.9 oz H20, 1 cup decaffeinated, 24-32 oz coconut water, body armor, 1/day caffeine free soda ?Nocturia: 2-3x/night ?Incomplete emptying: No ?Pain with urination: Negative ?Stream: Strong ?Urgency: Yes:   ?Difficulty initiating urination: Negative ?Intermittent stream: Negative ?Frequent UTI: Negative. ? ?Gastrointestinal History: ?Type of bowel movement:Type (Bristol Stool Scale) Type 4 ?Frequency of BMs: 3x/day ?Incomplete bowel movement: No ?Pain with defecation: Negative ?Straining with defecation: Negative ?Hemorrhoids: Negative ; internal/external; active/latent ?Toileting posture: feet flat ?Fiber supplement: No ?Incontinence: Negative.  ? ?Sexual History: ?Pain with penetration: Initial Penetration, During Penetration, and After  Intercourse ?Pain with external stimulation: No ?Change in ability to achieve orgasm: Yes: increased time ?Sexual abuse: No ? ? ?OBJECTIVE:  ? ?PATIENT SURVEYS:  ?FOTO Urinary Problem 53 ?PFDI Pain 58, PFDI Urinary 33 ? ?COGNITION: ?Overall cognitive status: Within functional limits for tasks assessed   ?  ?POSTURE/OBSERVATIONS:  ?Lumbar lordosis: diminished ?Thoracic kyphosis: increased ?Iliac crest height: not formally assessed  ?Lumbar lateral shift: not formally assessed  ?Pelvic obliquity: not formally assessed  ?Leg length discrepancy: not formally assessed  ? ?GAIT: ?Grossly WFL.  ? ?RANGE OF MOTION: deferred 2/2 to time constraints ?  LEFT RIGHT  ?Lumbar forward flexion (65):      ?  Lumbar extension (30):     ?Lumbar lateral flexion (25):     ?Thoracic and Lumbar rotation (30 degrees):       ?Hip Flexion (0-125):      ?Hip IR (0-45):     ?Hip ER (0-45):     ?Hip Abduction (0-40):     ?Hip extension (0-15):     ? ?SENSATION:deferred 2/2 to time constraints ?Grossly intact to light touch bilateral LEs as determined by testing dermatomes L2-S2 ?Proprioception and hot/cold testing deferred on this date ? ?STRENGTH: MMT deferred 2/2 to time constraints ? RLE LLE  ?Hip Flexion    ?Hip Extension    ?Hip Abduction     ?Hip Adduction     ?Hip ER     ?Hip IR     ?Knee Extension    ?Knee Flexion    ?Dorsiflexion     ?Plantarflexion (seated)    ? ?ABDOMINAL: deferred 2/2 to time constraints ?Palpation: ?Diastasis: ?Scar mobility: ?Rib flare: ? ?SPECIAL TESTS: deferred 2/2 to time constraints ? ? ?PHYSICAL PERFORMANCE MEASURES: ?STS: WNL ?Deep Squat: ?RLE STS: ?LLE STS:  ?6MWT: ?5TSTS:   ? ?EXTERNAL PELVIC EXAM: deferred 2/2 to time constraints ?Breath coordination: ?Voluntary Contraction: present/absent ?Relaxation: full/delayed/non-relaxing ?Perineal movement with sustained IAP increase ("bear down"): descent/no change/elevation/excessive descent ?Perineal movement with rapid IAP increase ("cough"): elevation/no  change/descent ? ?INTERNAL VAGINAL EXAM: deferred 2/2 to time constraints ?Introitus Appears:  ?Skin integrity:  ?Scar mobility: ?Strength (PERF):  ?Symmetry: ?Palpation: ?Prolapse: ?(0 no contraction, 1 flicker, 2 weak squeeze

## 2021-09-28 ENCOUNTER — Ambulatory Visit: Payer: Medicaid Other | Admitting: Physical Therapy

## 2021-09-28 ENCOUNTER — Encounter: Payer: Self-pay | Admitting: Physical Therapy

## 2021-09-28 DIAGNOSIS — R278 Other lack of coordination: Secondary | ICD-10-CM | POA: Diagnosis not present

## 2021-09-28 DIAGNOSIS — R293 Abnormal posture: Secondary | ICD-10-CM

## 2021-09-28 DIAGNOSIS — M6281 Muscle weakness (generalized): Secondary | ICD-10-CM | POA: Diagnosis not present

## 2021-09-28 NOTE — Therapy (Signed)
?OUTPATIENT PHYSICAL THERAPY TREATMENT NOTE ? ? ?Patient Name: Kaitlyn Turner ?MRN: [id number] ?DOB:12/27/1985, 36 y.o., female ?Today's Date: 09/28/2021 ? ?PCP: None noted ?REFERRING PROVIDER: Linda Hedges ? ?END OF SESSION:  ? PT End of Session - 09/28/21 1417   ? ? Visit Number 2   ? Number of Visits 12   ? Date for PT Re-Evaluation 12/14/21   ? Authorization Type [employer]   ? Authorization Time Period IE 09/21/2021   ? PT Start Time 1415   ? PT Stop Time 1455   ? PT Time Calculation (min) 40 min   ? Activity Tolerance Patient tolerated treatment well   ? Behavior During Therapy [facility] for tasks assessed/performed   ? ?  ?  ? ?  ? ? ?Past Medical History:  ?Diagnosis Date  ? Anemia   ? Family history of alcoholism 01/08/2015  ? Per Centricity  ? Hypotension   ? Malignant hyperthermia   ? Mental disorder   ? Panic attacks  ? Pulmonary embolism ([place])   ? Vaginal Pap smear, abnormal   ? ?Past Surgical History:  ?Procedure Laterality Date  ? DENTAL SURGERY    ? ?Patient Active Problem List  ? Diagnosis Date Noted  ? NSVD (normal spontaneous vaginal delivery) 07/01/2021  ? Postpartum hemorrhage, third stage, delivered 07/01/2021  ? Personal history of PE (pulmonary embolism) 07/01/2021  ? History of malignant hyperthermia 06/17/2021  ? Nausea & vomiting 04/23/2021  ? Low grade squamous intraepithelial lesion on cytologic smear of cervix (LGSIL) 12/23/2020  ? Overweight BMI=28.8 01/30/2020  ? H/O alcohol abuse--DUI 2019 01/30/2020  ? History of pulmonary embolism 01/30/2020  ? Chest pain 01/06/2019  ? MDD (major depressive disorder), recurrent episode ([place]) 08/21/2018  ? Suicidal ideation 08/20/2018  ? Family disruption due to divorce or legal separation 06/03/2015  ? Adjustment disorder with mixed anxiety and depressed mood 06/03/2015  ? Family history of alcoholism 01/08/2015  ? ? ?REFERRING DIAG: R32 (ICD-10-CM) - Unspecified urinary incontinence  ? ?THERAPY DIAG:  ?Other lack of coordination ? ?Muscle weakness  (generalized) ? ?Abnormal posture ? ?PERTINENT HISTORY: Interval Hx:  ?-Concerns: abdominal pain when she presses hard just to the right and below her umbilicus ?- also notes some weakness in her left leg with a little edema - will allow more time to heal but consider Neuro referral if it doesn't resolve ?- no active vaginal discharge or bleeding ?- pain (see above) ?- urinary incontinence (noted right after delivery but none now)  ?- no fecal incontinence  ?- n/a incision  ? ?PRECAUTIONS: post-partum ? ?SUBJECTIVE: Patient noticed that toileting posture includes heel elevated. Patient denies any other changes. Patient did try wearing wedges over the weekend and had increased suprapubic pain (4-5/10).  ? ?PAIN:  ?Are you having pain? Yes: NPRS scale: 4-5/10 ?Pain location: L lumbar ? ? ?OBJECTIVE: (objective measures completed at initial evaluation unless otherwise dated) ? ?TREATMENT ? ?Pre-treatment assessment: ?RANGE OF MOTION:  ?  LEFT RIGHT  ?Lumbar forward flexion (65):  WNL    ?Lumbar extension (30): WNL*    ?Lumbar lateral flexion (25):  WNL* WNL  ?Thoracic and Lumbar rotation (30 degrees):    WNL WNL  ?Hip Flexion (0-125):   WNL WNL  ?Hip IR (0-45):  WNL WNL  ?Hip ER (0-45):  WNL WNL  ?Hip Abduction (0-40):  WNL WNL  ? ?SENSATION: ?Grossly intact to light touch bilateral LEs as determined by testing dermatomes L2-S2 ?Proprioception and hot/cold testing deferred on this date ? ?  STRENGTH: MMT  ? RLE LLE  ?Hip Flexion 5 5  ?Hip Abduction  5* (LBP) 5* (LBP)  ?Hip Adduction  5* (PSJ) 5* (PSJ)  ?Hip ER  5* 5*  ?Hip IR  5* 5*  ?Knee Extension 5 5  ?Knee Flexion 5 5  ?Dorsiflexion  5 5  ? ?ABDOMINAL:  ?Palpation: no TTP ?Diastasis: 3 finger separation at umbilicus, 2 finger inferior and superior ?Rib flare: none ? ?SPECIAL TESTS: ?SLR (SN 92, -LR 0.29): R: Negative L:  Negative ?Lumbar quadrant (SN 70): R: Negative L: Positive ?FABER (SN 81): R: Negative L: Negative ?FADIR (SN 94): R: Negative L:  Negative ?Stork/March (SP 93): R: Negative L: Negative ? ? ?Manual Therapy: ? ? ?Neuromuscular Re-education: ?Patient education on postural rehabilitation interventions including: ([place]) ?- Hooklying Transversus Abdominis Palpation  - 2 sets - 10 reps ?- Hooklying Small March  - 2 sets - 10 reps ?- Bent Knee Fallouts  - 2 sets - 10 reps ?- Scapula Isolations  - 2 sets - 10 reps ?- Beginner Bridge  - 2 sets - 10 reps ? ?Therapeutic Exercise: ? ? ?Treatments unbilled: ? ?Post-treatment assessment: ? ?Patient educated throughout session on appropriate technique and form using multi-modal cueing, HEP, and activity modification. Patient articulated understanding and returned demonstration. ? ?Patient Response to interventions: ? ? ?ASSESSMENT ? ?PATIENT SURVEYS:  ?FOTO Urinary Problem 53 ?PFDI Pain 58, PFDI Urinary 33 ? ?ASSESSMENT: ? ?Clinical impression: ?Patient presents to clinic with excellent motivation to participate in therapy. Patient demonstrates deficits in deep core strength and coordination, PFM coordination, PFM strength, postural control/endurance, and pain. Patient had (+) L lumbar quadrant test, 2-2.5 finger width separation of rectus abdominus, and pain with passive hip adduction B during today's session and responded positively to postural education interventions. Patient will benefit from continued skilled therapeutic intervention to address remaining deficits in deep core strength and coordination, PFM coordination, PFM strength, postural control/endurance, and pain in order to increase function and improve overall QOL. ? ? ?Objective impairments: decreased activity tolerance, decreased coordination, decreased endurance, decreased strength, improper body mechanics, postural dysfunction, and pain.  ? ?Activity limitations: cleaning, community activity, driving, meal prep, occupation, and laundry.  ? ?Personal factors: Behavior pattern, Fitness, Past/current experiences, Time since onset of  injury/illness/exacerbation, and 3+ comorbidities: MDD, personal history of PE, LGSIL, hypotension, anemia are also affecting patient's functional outcome.  ? ?Rehab Potential: Good ? ?Clinical decision making: Evolving/moderate complexity ? ?Evaluation complexity: Moderate ? ? ?GOALS: ?Goals reviewed with patient? Yes ? ?LONG TERM GOALS: Target date: 12/14/2021 ? ?Patient will demonstrate improved function as evidenced by a score of 61 on FOTO measure for full participation in activities at home and in the community.  ?Baseline: 53 (urinary) ?Goal status: INITIAL ? ?2.  Patient will demonstrate independence with HEP in order to maximize therapeutic gains and improve carryover from physical therapy sessions to ADLs in the home and community. ?Baseline: not initiated ?Goal status: INITIAL ? ?3.  Patient will demonstrate improved function as evidenced by a score of <21 on FOTO PFDI Pain measure for full participation in activities at home and in the community. ?Baseline: 58 ?Goal status: INITIAL ? ?4.  Patient will demonstrate improved function as evidenced by a score of <14 on FOTO PFDI Urinary measure for full participation in activities at home and in the community. ?Baseline: 33 ?Goal status: INITIAL ? ?5. Patient will demonstrate circumferential and sequential contraction of >4/5 MMT, > 6 sec hold x10 and 5 consecutive quick flicks  with </= 10 min rest between testing bouts, and relaxation of the PFM coordinated with breath for improved management of intra-abdominal pressure and normal bowel and bladder function without the presence of pain nor incontinence in order to improve participation at home and in the community. ?Baseline: not formally assessed  ?Goal status: INITIAL ? ? ? ?PLAN: ?Rehab frequency: 1x/week ? ?Rehab duration: 12 weeks ? ?Planned interventions: Therapeutic exercises, Therapeutic activity, Neuromuscular re-education, Balance training, Gait training, Patient/Family education, Joint  mobilization, Orthotic/Fit training, Dry Needling, Electrical stimulation, Spinal manipulation, Spinal mobilization, Cryotherapy, Moist heat, scar mobilization, Taping, and Manual therapy ? ? ? ?Myles Gip PT, DPT [phone]  ?09/28/2021, 2:17 P

## 2021-10-05 ENCOUNTER — Ambulatory Visit: Payer: Medicaid Other | Admitting: Physical Therapy

## 2021-10-09 DIAGNOSIS — Z30017 Encounter for initial prescription of implantable subdermal contraceptive: Secondary | ICD-10-CM | POA: Diagnosis not present

## 2021-10-09 DIAGNOSIS — Z3202 Encounter for pregnancy test, result negative: Secondary | ICD-10-CM | POA: Diagnosis not present

## 2021-10-14 ENCOUNTER — Encounter: Payer: Medicaid Other | Admitting: Physical Therapy

## 2021-10-15 ENCOUNTER — Ambulatory Visit: Payer: Medicaid Other | Attending: Certified Nurse Midwife | Admitting: Physical Therapy

## 2021-10-15 ENCOUNTER — Encounter: Payer: Self-pay | Admitting: Physical Therapy

## 2021-10-15 DIAGNOSIS — R278 Other lack of coordination: Secondary | ICD-10-CM | POA: Diagnosis not present

## 2021-10-15 DIAGNOSIS — M6281 Muscle weakness (generalized): Secondary | ICD-10-CM | POA: Diagnosis not present

## 2021-10-15 DIAGNOSIS — Z419 Encounter for procedure for purposes other than remedying health state, unspecified: Secondary | ICD-10-CM | POA: Diagnosis not present

## 2021-10-15 DIAGNOSIS — R293 Abnormal posture: Secondary | ICD-10-CM | POA: Diagnosis not present

## 2021-10-15 NOTE — Therapy (Signed)
OUTPATIENT PHYSICAL THERAPY TREATMENT NOTE   Patient Name: Kaitlyn Turner MRN: [id number] DOB:[birth year], 36 y.o., female Today's Date: 10/15/2021  PCP: None noted REFERRING PROVIDER: Linda Hedges  END OF SESSION:   PT End of Session - 10/15/21 1420     Visit Number 3    Number of Visits 12    Date for PT Re-Evaluation 12/14/21    Authorization Type [employer]    Authorization Time Period IE 09/21/2021    PT Start Time 1415    PT Stop Time 1455    PT Time Calculation (min) 40 min    Activity Tolerance Patient tolerated treatment well    Behavior During Therapy [facility] for tasks assessed/performed             Past Medical History:  Diagnosis Date   Anemia    Family history of alcoholism 01/08/2015   Per [employer]   Hypotension    Malignant hyperthermia    Mental disorder    Panic attacks   Pulmonary embolism ([place])    Vaginal Pap smear, abnormal    Past Surgical History:  Procedure Laterality Date   DENTAL SURGERY     Patient Active Problem List   Diagnosis Date Noted   NSVD (normal spontaneous vaginal delivery) 07/01/2021   Postpartum hemorrhage, third stage, delivered 07/01/2021   Personal history of PE (pulmonary embolism) 07/01/2021   History of malignant hyperthermia 06/17/2021   Nausea & vomiting 04/23/2021   Low grade squamous intraepithelial lesion on cytologic smear of cervix (LGSIL) 12/23/2020   Overweight BMI=28.8 01/30/2020   H/O alcohol abuse--DUI 2019 01/30/2020   History of pulmonary embolism 01/30/2020   Chest pain 01/06/2019   MDD (major depressive disorder), recurrent episode ([place]) 08/21/2018   Suicidal ideation 08/20/2018   Family disruption due to divorce or legal separation 06/03/2015   Adjustment disorder with mixed anxiety and depressed mood 06/03/2015   Family history of alcoholism 01/08/2015    REFERRING DIAG: R32 (ICD-10-CM) - Unspecified urinary incontinence   THERAPY DIAG:  Other lack of coordination  Muscle weakness  (generalized)  Abnormal posture  PERTINENT HISTORY: Interval Hx:  -Concerns: abdominal pain when she presses hard just to the right and below her umbilicus - also notes some weakness in her left leg with a little edema - will allow more time to heal but consider Neuro referral if it doesn't resolve - no active vaginal discharge or bleeding - pain (see above) - urinary incontinence (noted right after delivery but none now)  - no fecal incontinence  - n/a incision   PRECAUTIONS: post-partum  SUBJECTIVE: Patient reports that she is feeling better form recent illness. She has been doing deep core exercises with good effect and can feel some muscle soreness in the low back and suprapubic area. Patient notes increased SIJ pain.   PAIN:  Are you having pain? Yes: NPRS scale: 7/10 Pain location: B SIJ (sharp)   OBJECTIVE: (objective measures completed at initial evaluation unless otherwise dated)     RANGE OF MOTION:    LEFT RIGHT  Lumbar forward flexion (65):  WNL    Lumbar extension (30): WNL*    Lumbar lateral flexion (25):  WNL* WNL  Thoracic and Lumbar rotation (30 degrees):    WNL WNL  Hip Flexion (0-125):   WNL WNL  Hip IR (0-45):  WNL WNL  Hip ER (0-45):  WNL WNL  Hip Abduction (0-40):  WNL WNL   SENSATION: Grossly intact to light touch bilateral LEs as determined by  testing dermatomes L2-S2 Proprioception and hot/cold testing deferred on this date  STRENGTH: MMT   RLE LLE  Hip Flexion 5 5  Hip Abduction  5* (LBP) 5* (LBP)  Hip Adduction  5* (PSJ) 5* (PSJ)  Hip ER  5* 5*  Hip IR  5* 5*  Knee Extension 5 5  Knee Flexion 5 5  Dorsiflexion  5 5   ABDOMINAL:  Palpation: no TTP Diastasis: 3 finger separation at umbilicus, 2 finger inferior and superior Rib flare: none  SPECIAL TESTS: SLR (SN 92, -LR 0.29): R: Negative L:  Negative Lumbar quadrant (SN 70): R: Negative L: Positive FABER (SN 81): R: Negative L: Negative FADIR (SN 94): R: Negative L:  Negative Stork/March (SP 93): R: Negative L: Negative  TREATMENT Manual Therapy: STM and TPR performed to B gluteal mm and lumbar mm to allow for decreased tension and pain and improved posture and function with vibratory and percussive device Sacral border mobilizations with movement for decreased spasm and improved mobility, grade II/III   Neuromuscular Re-education: Pelvic MET correction (L hip extension, R hip flexion isometrics) 5 x5 sec for improved pelvic alignment Prone heel squeezes, 5 x5 sec for improved pain and pelvic posture  Therapeutic Exercise:   Treatments unbilled:  Post-treatment assessment:  Patient educated throughout session on appropriate technique and form using multi-modal cueing, HEP, and activity modification. Patient articulated understanding and returned demonstration.  Patient Response to interventions: 4-5/10  ASSESSMENT  PATIENT SURVEYS:  FOTO Urinary Problem 53 PFDI Pain 58, PFDI Urinary 33  ASSESSMENT:  Clinical impression: Patient presents to clinic with excellent motivation to participate in therapy. Patient demonstrates deficits in deep core strength and coordination, PFM coordination, PFM strength, postural control/endurance, and pain. Patient had significant pain reduction with manual and active interventions during today's session and responded positively to use of isometrics for pelvic realignment. Patient will benefit from continued skilled therapeutic intervention to address remaining deficits in deep core strength and coordination, PFM coordination, PFM strength, postural control/endurance, and pain in order to increase function and improve overall QOL.   Objective impairments: decreased activity tolerance, decreased coordination, decreased endurance, decreased strength, improper body mechanics, postural dysfunction, and pain.   Activity limitations: cleaning, community activity, driving, meal prep, occupation, and laundry.    Personal factors: Behavior pattern, Fitness, Past/current experiences, Time since onset of injury/illness/exacerbation, and 3+ comorbidities: MDD, personal history of PE, LGSIL, hypotension, anemia are also affecting patient's functional outcome.   Rehab Potential: Good  Clinical decision making: Evolving/moderate complexity  Evaluation complexity: Moderate   GOALS: Goals reviewed with patient? Yes  LONG TERM GOALS: Target date: 12/14/2021  Patient will demonstrate improved function as evidenced by a score of 61 on FOTO measure for full participation in activities at home and in the community.  Baseline: 53 (urinary) Goal status: INITIAL  2.  Patient will demonstrate independence with HEP in order to maximize therapeutic gains and improve carryover from physical therapy sessions to ADLs in the home and community. Baseline: not initiated Goal status: INITIAL  3.  Patient will demonstrate improved function as evidenced by a score of <21 on FOTO PFDI Pain measure for full participation in activities at home and in the community. Baseline: 58 Goal status: INITIAL  4.  Patient will demonstrate improved function as evidenced by a score of <14 on FOTO PFDI Urinary measure for full participation in activities at home and in the community. Baseline: 33 Goal status: INITIAL  5. Patient will demonstrate circumferential and sequential contraction of >  4/5 MMT, > 6 sec hold x10 and 5 consecutive quick flicks with </= 10 min rest between testing bouts, and relaxation of the PFM coordinated with breath for improved management of intra-abdominal pressure and normal bowel and bladder function without the presence of pain nor incontinence in order to improve participation at home and in the community. Baseline: not formally assessed  Goal status: INITIAL    PLAN: Rehab frequency: 1x/week  Rehab duration: 12 weeks  Planned interventions: Therapeutic exercises, Therapeutic activity,  Neuromuscular re-education, Balance training, Gait training, Patient/Family education, Joint mobilization, Orthotic/Fit training, Dry Needling, Electrical stimulation, Spinal manipulation, Spinal mobilization, Cryotherapy, Moist heat, scar mobilization, Taping, and Manual therapy    Myles Gip PT, DPT [phone]  10/15/2021, 2:21 PM

## 2021-10-20 ENCOUNTER — Encounter: Payer: Medicaid Other | Admitting: Physical Therapy

## 2021-10-21 ENCOUNTER — Encounter: Payer: Medicaid Other | Admitting: Physical Therapy

## 2021-10-27 ENCOUNTER — Encounter: Payer: Medicaid Other | Admitting: Physical Therapy

## 2021-11-05 ENCOUNTER — Encounter: Payer: Medicaid Other | Admitting: Physical Therapy

## 2021-11-05 ENCOUNTER — Encounter: Payer: Self-pay | Admitting: Physical Therapy

## 2021-11-14 DIAGNOSIS — Z419 Encounter for procedure for purposes other than remedying health state, unspecified: Secondary | ICD-10-CM | POA: Diagnosis not present

## 2021-12-15 DIAGNOSIS — Z419 Encounter for procedure for purposes other than remedying health state, unspecified: Secondary | ICD-10-CM | POA: Diagnosis not present

## 2022-01-15 DIAGNOSIS — Z419 Encounter for procedure for purposes other than remedying health state, unspecified: Secondary | ICD-10-CM | POA: Diagnosis not present

## 2022-02-14 DIAGNOSIS — Z419 Encounter for procedure for purposes other than remedying health state, unspecified: Secondary | ICD-10-CM | POA: Diagnosis not present

## 2022-02-17 NOTE — Progress Notes (Unsigned)
New patient visit  I,[name],acting as a scribe for [employer], MD.,have documented all relevant documentation on the behalf of [name], MD,as directed by  [name], MD while in the presence of [name], MD.   Patient: Kaitlyn Turner   DOB: 21-Jun-1985   36 y.o. Female  MRN: [id number] Visit Date: 02/18/2022  Today's healthcare provider: [name], MD   No chief complaint on file.  Subjective    Kaitlyn Turner is a 36 y.o. female who presents today as a new patient to establish care.  HPI  ***  Past Medical History:  Diagnosis Date   Anemia    Family history of alcoholism 01/08/2015   Per [employer]   Hypotension    Malignant hyperthermia    Mental disorder    Panic attacks   Pulmonary embolism ([place])    Vaginal Pap smear, abnormal    Past Surgical History:  Procedure Laterality Date   DENTAL SURGERY     Family Status  Relation Name Status   Mother  Alive   Father  Alive   MGM  (Not Specified)   Family History  Problem Relation Age of Onset   Cancer Mother        Unsure   Hypertension Father    Hypertension Maternal Grandmother    Heart disease Maternal Grandmother    Social History   Socioeconomic History   Marital status: Significant Other    Spouse name: Not on file   Number of children: 3   Years of education: 14   Highest education level: Associate degree: occupational, Hotel manager, or vocational program  Occupational History   Not on file  Tobacco Use   Smoking status: Never   Smokeless tobacco: Never  Vaping Use   Vaping Use: Never used  Substance and Sexual Activity   Alcohol use: Not Currently    Comment: occasionally   Drug use: No   Sexual activity: Yes    Partners: Male    Birth control/protection: None  Other Topics Concern   Not on file  Social History Narrative   Not on file   Social Determinants of Health   Financial Resource Strain: Not  on file  Food Insecurity: Not on file  Transportation Needs: Not on file  Physical Activity: Not on file  Stress: Not on file  Social Connections: Not on file   Outpatient Medications Prior to Visit  Medication Sig   acetaminophen (TYLENOL) 325 MG tablet Take 2 tablets (650 mg total) by mouth every 4 (four) hours as needed (for pain scale < 4). (Patient not taking: Reported on 09/21/2021)   benzocaine-Menthol (DERMOPLAST) 20-0.5 % AERO Apply 1 application topically as needed for irritation (perineal discomfort). (Patient not taking: Reported on 09/21/2021)   clindamycin (CLEOCIN) 300 MG capsule Take 1 capsule (300 mg total) by mouth 3 (three) times daily. (Patient not taking: Reported on 09/21/2021)   coconut oil OIL Apply 1 application topically as needed. (Patient not taking: Reported on 09/21/2021)   dibucaine (NUPERCAINAL) 1 % OINT Place 1 application rectally as needed for hemorrhoids. (Patient not taking: Reported on 09/21/2021)   docusate sodium (COLACE) 100 MG capsule Take 1 capsule (100 mg total) by mouth 2 (two) times daily. (Patient not taking: Reported on 09/21/2021)   ferrous sulfate 325 (65 FE) MG tablet Take 1 tablet (325 mg total) by mouth 2 (two) times daily with a meal. (Patient not taking: Reported on 09/21/2021)   ibuprofen (ADVIL) 600 MG  tablet Take 1 tablet (600 mg total) by mouth every 6 (six) hours. (Patient not taking: Reported on 09/21/2021)   Prenatal Vit-Fe Fumarate-FA (PRENATAL MULTIVITAMIN) TABS tablet Take 1 tablet by mouth daily at 12 noon.   simethicone (MYLICON) 80 MG chewable tablet Chew 1 tablet (80 mg total) by mouth as needed for flatulence. (Patient not taking: Reported on 09/21/2021)   witch hazel-glycerin (TUCKS) pad Apply 1 application topically as needed for hemorrhoids. (Patient not taking: Reported on 09/21/2021)   No facility-administered medications prior to visit.   Allergies  Allergen Reactions   Stadol [Butorphanol] Itching    Per Harden Mo, RN   Keflex  [Cephalexin] Hives   Other     General anesthesia - reaction was hypothermia   Pork-Derived Products Hives and Itching    Patient is allergic to pork   Toradol [Ketorolac Tromethamine] Hives    Immunization History  Administered Date(s) Administered   Pneumococcal Polysaccharide-23 01/07/2019    Health Maintenance  Topic Date Due   COVID-19 Vaccine (3 - Pfizer series) 08/13/2019   PAP SMEAR-Modifier  01/29/2021   INFLUENZA VACCINE  Never done   TETANUS/TDAP  04/16/2031   Hepatitis C Screening  Completed   HIV Screening  Completed   HPV VACCINES  Aged Out    Patient Care Team: Pcp, No as PCP - General  Review of Systems  {Labs  Heme  Chem  Endocrine  Serology  Results Review (optional):23779}   Objective    There were no vitals taken for this visit. {Show previous vital signs (optional):23777}  Physical Exam ***  Depression Screen    11/03/2020    4:02 PM 01/30/2020    8:48 AM  PHQ 2/9 Scores  PHQ - 2 Score 0 0   No results found for any visits on 02/18/22.  Assessment & Plan     ***  No follow-ups on file.     {provider attestation***:1}   [name], MD  [facility] [phone] (phone) [phone] (fax)  [place]

## 2022-02-18 ENCOUNTER — Ambulatory Visit (INDEPENDENT_AMBULATORY_CARE_PROVIDER_SITE_OTHER): Payer: Medicaid Other | Admitting: Family Medicine

## 2022-02-18 ENCOUNTER — Encounter: Payer: Self-pay | Admitting: Family Medicine

## 2022-02-18 VITALS — BP 134/81 | HR 77 | Temp 98.3°F | Resp 16 | Ht 66.0 in | Wt 179.0 lb

## 2022-02-18 DIAGNOSIS — Z23 Encounter for immunization: Secondary | ICD-10-CM | POA: Diagnosis not present

## 2022-02-18 DIAGNOSIS — R55 Syncope and collapse: Secondary | ICD-10-CM | POA: Diagnosis not present

## 2022-02-18 DIAGNOSIS — R2232 Localized swelling, mass and lump, left upper limb: Secondary | ICD-10-CM | POA: Diagnosis not present

## 2022-02-18 NOTE — Assessment & Plan Note (Signed)
Acute, new problem Could potentially be enlarged LN or lipoma  nontender nodule in left upper extremity  Nodule is soft and mobile  Will send for soft tissue ultrasound No nodules present in breast  Patient is currently lactating

## 2022-02-18 NOTE — Patient Instructions (Signed)
Syncope, Adult  Syncope is when you pass out or faint for a short time. It is caused by a sudden decrease in blood flow to the brain. This can happen for many reasons. It can sometimes happen when seeing blood, getting a shot (injection), or having pain or strong emotions. Most causes of fainting are not dangerous, but in some cases it can be a sign of a serious medical problem. If you faint, get help right away. Call your local emergency services (911 in the [place] Follow these instructions at home: Watch for any changes in your symptoms. Take these actions to stay safe and help with your symptoms: Knowing when you may be about to faint Signs that you may be about to faint include: Feeling dizzy or light-headed. It may feel like the room is spinning. Feeling weak. Feeling like you may vomit (nauseous). Seeing spots or seeing all white or all black. Having cold, clammy skin. Feeling warm and sweaty. Hearing ringing in the ears. If you start to feel like you might faint, sit or lie down right away. If sitting, lower your head down between your legs. If lying down, raise (elevate) your feet above the level of your heart. Breathe deeply and steadily. Wait until all of the symptoms are gone. Have someone stay with you until you feel better. Medicines Take over-the-counter and prescription medicines only as told by your doctor. If you are taking blood pressure or heart medicine, sit up and stand up slowly. Spend a few minutes getting ready to sit and then stand. This can help you feel less dizzy. Lifestyle Do not drive, use machinery, or play sports until your doctor says it is okay. Do not drink alcohol. Do not smoke or use any products that contain nicotine or tobacco. If you need help quitting, ask your doctor. Avoid hot tubs and saunas. General instructions Talk with your doctor about your symptoms. You may need to have testing to help find the cause. Drink enough fluid to keep your pee  (urine) pale yellow. Avoid standing for a long time. If you must stand for a long time, do movements such as: Moving your legs. Crossing your legs. Flexing and stretching your leg muscles. Squatting. Keep all follow-up visits. Contact a doctor if: You have episodes of near fainting. Get help right away if: You pass out or faint. You hit your head or are injured after fainting. You have any of these symptoms: Fast or uneven heartbeats (palpitations). Pain in your chest, belly, or back. Shortness of breath. You have jerky movements that you cannot control (seizure). You have a very bad headache. You are confused. You have problems with how you see (vision). You are very weak. You have trouble walking. You are bleeding from your mouth or your butt (rectum). You have black or tarry poop (stool). These symptoms may be an emergency. Get help right away. Call your local emergency services (911 in the [place] Do not wait to see if the symptoms will go away. Do not drive yourself to the hospital. Summary Syncope is when you pass out or faint for a short time. It is caused by a sudden decrease in blood flow to the brain. Signs that you may be about to faint include feeling dizzy or light-headed, feeling like you may vomit, seeing all white or all black, or having cold, clammy skin. If you start to feel like you might faint, sit or lie down right away. Lower your head if sitting, or raise (elevate)   your feet if lying down. Breathe deeply and steadily. Wait until all of the symptoms are gone. This information is not intended to replace advice given to you by your health care provider. Make sure you discuss any questions you have with your health care provider. Document Revised: 09/11/2020 Document Reviewed: 09/11/2020 Elsevier Patient Education  2023 [employer] Inc[employer]  

## 2022-02-18 NOTE — Assessment & Plan Note (Addendum)
New problem Syncopal events appear to be in setting of increased emotional stress Discussed maintaining oral hydration with fluids and monitoring stress Discussed potential contribution of anxiety to increased irritability, patient state please EKG today s that she does not feel anxious and reports history of inconsistency with pills Reports that this started 2-3 months ago and occurs occasionally Previously evaluated by cardiology with no further follow-up We will complete EKG to assess for any arrhythmia We will check CMP, magnesium, CBC and TSH F/u in 2-3 weeks    Indication/Reason for EKG: syncope Comparison to prior : improved  Findings: NSR, normal intervals, no signs of ischemia, regular rate  Conclusion: normal EKG  Date: 02/18/22  [name], MD

## 2022-02-19 ENCOUNTER — Ambulatory Visit
Admission: EM | Admit: 2022-02-19 | Discharge: 2022-02-19 | Disposition: A | Discharge status: Home / Self Care | Payer: Medicaid Other | Attending: Emergency Medicine | Admitting: Emergency Medicine

## 2022-02-19 ENCOUNTER — Other Ambulatory Visit: Payer: Self-pay

## 2022-02-19 DIAGNOSIS — J069 Acute upper respiratory infection, unspecified: Secondary | ICD-10-CM | POA: Diagnosis not present

## 2022-02-19 DIAGNOSIS — Z1152 Encounter for screening for COVID-19: Secondary | ICD-10-CM | POA: Insufficient documentation

## 2022-02-19 LAB — TSH+FREE T4
Free T4: 2.03 ng/dL — ABNORMAL HIGH (ref 0.82–1.77)
TSH: 1.12 u[IU]/mL (ref 0.450–4.500)

## 2022-02-19 LAB — CBC
Hematocrit: 38.5 % (ref 34.0–46.6)
Hemoglobin: 12.9 g/dL (ref 11.1–15.9)
MCH: 30.8 pg (ref 26.6–33.0)
MCHC: 33.5 g/dL (ref 31.5–35.7)
MCV: 92 fL (ref 79–97)
Platelets: 259 10*3/uL (ref 150–450)
RBC: 4.19 x10E6/uL (ref 3.77–5.28)
RDW: 12 % (ref 11.7–15.4)
WBC: 7.1 10*3/uL (ref 3.4–10.8)

## 2022-02-19 LAB — COMPREHENSIVE METABOLIC PANEL
ALT: 14 IU/L (ref 0–32)
AST: 10 IU/L (ref 0–40)
Albumin/Globulin Ratio: 1.7 (ref 1.2–2.2)
Albumin: 4.7 g/dL (ref 3.9–4.9)
Alkaline Phosphatase: 88 IU/L (ref 44–121)
BUN/Creatinine Ratio: 19 (ref 9–23)
BUN: 15 mg/dL (ref 6–20)
Bilirubin Total: 0.4 mg/dL (ref 0.0–1.2)
CO2: 23 mmol/L (ref 20–29)
Calcium: 9.3 mg/dL (ref 8.7–10.2)
Chloride: 104 mmol/L (ref 96–106)
Creatinine, Ser: 0.78 mg/dL (ref 0.57–1.00)
Globulin, Total: 2.7 g/dL (ref 1.5–4.5)
Glucose: 83 mg/dL (ref 70–99)
Potassium: 4 mmol/L (ref 3.5–5.2)
Sodium: 141 mmol/L (ref 134–144)
Total Protein: 7.4 g/dL (ref 6.0–8.5)
eGFR: 102 mL/min/{1.73_m2} (ref >59)

## 2022-02-19 LAB — GROUP A STREP BY PCR: Group A Strep by PCR: NOT DETECTED

## 2022-02-19 LAB — MAGNESIUM: Magnesium: 1.9 mg/dL (ref 1.6–2.3)

## 2022-02-19 LAB — SARS CORONAVIRUS 2 BY RT PCR: SARS Coronavirus 2 by RT PCR: NEGATIVE

## 2022-02-19 MED ORDER — IPRATROPIUM BROMIDE 0.06 % NA SOLN: 2.0000 | Freq: Four times a day (QID) | NASAL | 12 refills | Status: DC

## 2022-02-19 MED ORDER — LIDOCAINE VISCOUS HCL 2 % MT SOLN: 5.0000 mL | Freq: Four times a day (QID) | OROMUCOSAL | 0 refills | Status: DC | PRN

## 2022-02-19 NOTE — ED Triage Notes (Signed)
Pt states all 3 of her children tested positive for strep yesterday, pt c/o sore throat, congestion onset yesterday.

## 2022-02-19 NOTE — ED Provider Notes (Signed)
[facility]    CSN: [id number] Arrival date & time: 02/19/22  1114      History   Chief Complaint Chief Complaint  Patient presents with   Sore Throat   Nasal Congestion    HPI YOSELYN MCGLADE is a 36 y.o. female.   HPI  36 year old female here for evaluation of sore throat.  Patient reports that her sore throat developed last evening and is also associated with some nasal congestion, sweating last night, and a mild cough last night that is not present today.  She denies any nasal discharge or ear pain.  She did receive her flu shot yesterday morning.  All 3 of her children also tested positive for strep so she is concerned she may have strep throat.  Past Medical History:  Diagnosis Date   Anemia    Clotting disorder ([place])    Family history of alcoholism 01/08/2015   Per Centricity   Hypotension    Malignant hyperthermia    Mental disorder    Panic attacks   Pulmonary embolism ([place])    Pulmonary embolism (HCC)    Vaginal Pap smear, abnormal     Patient Active Problem List   Diagnosis Date Noted   Syncope, vasovagal 02/18/2022   Subcutaneous nodule of left upper extremity 02/18/2022   NSVD (normal spontaneous vaginal delivery) 07/01/2021   Postpartum hemorrhage, third stage, delivered 07/01/2021   Personal history of PE (pulmonary embolism) 07/01/2021   History of malignant hyperthermia 06/17/2021   Nausea & vomiting 04/23/2021   Low grade squamous intraepithelial lesion on cytologic smear of cervix (LGSIL) 12/23/2020   Overweight BMI=28.8 01/30/2020   H/O alcohol abuse--DUI 2019 01/30/2020   History of pulmonary embolism 01/30/2020   Chest pain 01/06/2019   MDD (major depressive disorder), recurrent episode ([place]) 08/21/2018   Suicidal ideation 08/20/2018   Family disruption due to divorce or legal separation 06/03/2015   Adjustment disorder with mixed anxiety and depressed mood 06/03/2015   Family history of alcoholism 01/08/2015    Past  Surgical History:  Procedure Laterality Date   DENTAL SURGERY      OB History     Gravida  9   Para  4   Term  4   Preterm  0   AB  5   Living  4      SAB  3   IAB  2   Ectopic  0   Multiple  0   Live Births  4            Home Medications    Prior to Admission medications   Medication Sig Start Date End Date Taking? Authorizing Provider  ipratropium (ATROVENT) 0.06 % nasal spray Place 2 sprays into both nostrils 4 (four) times daily. 02/19/22  Yes Margarette Canada, NP  magic mouthwash (lidocaine, diphenhydrAMINE, alum & mag hydroxide) suspension Swish and spit 5 mLs 4 (four) times daily as needed for mouth pain. 02/19/22  Yes Margarette Canada, NP  NEXPLANON 68 MG IMPL implant  10/01/21  Yes [provider]  Prenatal Vit-Fe Fumarate-FA (PRENATAL MULTIVITAMIN) TABS tablet Take 1 tablet by mouth daily at 12 noon. 07/02/21  Yes Dickerson, Felicia Lucy Lorena, CNM  COLCRYS 0.6 MG tablet Take 1 tablet (0.6 mg total) by mouth daily. Patient not taking: Reported on 01/30/2020 06/26/19 05/30/20  Kate Sable, MD  pantoprazole (PROTONIX) 40 MG tablet Take 1 tablet (40 mg total) by mouth daily. For 1 month. Patient not taking: Reported on 01/30/2020 06/21/19  05/30/20  Kate Sable, MD  traZODone (DESYREL) 50 MG tablet Take 1 tablet (50 mg total) by mouth at bedtime and may repeat dose one time if needed. For sleep 08/23/18 01/06/19  Connye Burkitt, NP    Family History Family History  Problem Relation Age of Onset   Cancer Mother        Unsure   Hypertension Father    Hypertension Maternal Grandmother    Heart disease Maternal Grandmother     Social History Social History   Tobacco Use   Smoking status: Never   Smokeless tobacco: Never  Vaping Use   Vaping Use: Never used  Substance Use Topics   Alcohol use: Not Currently    Comment: occasionally   Drug use: No     Allergies   Stadol [butorphanol], Keflex [cephalexin], Other, Pork-derived products, and  Toradol [ketorolac tromethamine]   Review of Systems Review of Systems  Constitutional:  Positive for diaphoresis. Negative for fever.  HENT:  Positive for congestion and sore throat. Negative for ear pain and rhinorrhea.   Respiratory:  Positive for cough. Negative for shortness of breath and wheezing.   Hematological: Negative.   Psychiatric/Behavioral: Negative.       Physical Exam Triage Vital Signs ED Triage Vitals  Enc Vitals Group     BP 02/19/22 1217 104/72     Pulse Rate 02/19/22 1217 79     Resp --      Temp 02/19/22 1217 98.5 F (36.9 C)     Temp Source 02/19/22 1217 Oral     SpO2 02/19/22 1217 97 %     Weight 02/19/22 1216 179 lb (81.2 kg)     Height 02/19/22 1216 '5\' 6"'$  (1.676 m)     Head Circumference --      Peak Flow --      Pain Score 02/19/22 1215 3     Pain Loc --      Pain Edu? --      Excl. in [place]? --    No data found.  Updated Vital Signs BP 104/72 (BP Location: Left Arm)   Pulse 79   Temp 98.5 F (36.9 C) (Oral)   Ht '5\' 6"'$  (1.676 m)   Wt 179 lb (81.2 kg)   SpO2 97%   Breastfeeding Yes   BMI 28.89 kg/m   Visual Acuity Right Eye Distance:   Left Eye Distance:   Bilateral Distance:    Right Eye Near:   Left Eye Near:    Bilateral Near:     Physical Exam Vitals and nursing note reviewed.  Constitutional:      Appearance: Normal appearance. She is ill-appearing.  HENT:     Head: Normocephalic and atraumatic.     Right Ear: Tympanic membrane, ear canal and external ear normal. There is no impacted cerumen.     Left Ear: Tympanic membrane, ear canal and external ear normal. There is no impacted cerumen.     Nose: Congestion and rhinorrhea present.     Mouth/Throat:     Mouth: Mucous membranes are moist.     Pharynx: Oropharynx is clear. Posterior oropharyngeal erythema present. No oropharyngeal exudate.  Cardiovascular:     Rate and Rhythm: Normal rate and regular rhythm.     Pulses: Normal pulses.     Heart sounds: Normal heart  sounds. No murmur heard.    No friction rub. No gallop.  Pulmonary:     Effort: Pulmonary effort is normal.  Breath sounds: Normal breath sounds. No wheezing, rhonchi or rales.  Musculoskeletal:     Cervical back: Normal range of motion and neck supple.  Lymphadenopathy:     Cervical: Cervical adenopathy present.  Skin:    General: Skin is warm and dry.     Capillary Refill: Capillary refill takes less than 2 seconds.     Findings: No erythema or rash.  Neurological:     General: No focal deficit present.     Mental Status: She is alert and oriented to person, place, and time.  Psychiatric:        Mood and Affect: Mood normal.        Behavior: Behavior normal.        Thought Content: Thought content normal.        Judgment: Judgment normal.      UC Treatments / Results  Labs (all labs ordered are listed, but only abnormal results are displayed) Labs Reviewed  GROUP A STREP BY PCR  SARS CORONAVIRUS 2 BY RT PCR    EKG   Radiology No results found.  Procedures Procedures (including critical care time)  Medications Ordered in UC Medications - No data to display  Initial Impression / Assessment and Plan / UC Course  I have reviewed the triage vital signs and the nursing notes.  Pertinent labs & imaging results that were available during my care of the patient were reviewed by me and considered in my medical decision making (see chart for details).   Patient is a pleasant, nontoxic-appearing 36 year old female here for evaluation of sore throat with associated upper respiratory symptoms that began last night.  All 3 of her children tested positive for strep yesterday and she is concerned she may have strep throat.  She states that her throat was so painful this morning she had difficulty swallowing.  Patient did receive her flu vaccination yesterday in the morning and her symptoms began in the afternoon.  On exam she has nasal congestion with erythematous and edematous  tissues with clear rhinorrhea.  Oropharyngeal exam reveals bilateral tonsillar pillars.  Her posterior oropharynx is erythematous and injected with small lymphoid follicles.  She does have anterior cervical of adenopathy on exam.  Cardiopulmonary exam bisque lung sounds in all fields.  Given her strep exposure I think is reasonable to run a strep PCR.  With upper respiratory symptoms I will also order a COVID PCR.  The symptoms may be related to her recent immunization as well.  Strep PCR is negative.  COVID PCR is negative.  I will discharge patient home with a diagnosis of viral URI.  Is entirely possible that her symptoms are being caused by her recent influenza vaccination.  I we will give her prescription for Atrovent nasal spray to help with the congestion as well as Magic mouthwash that she can gargle and spit to help with her throat pain.  She can also use over-the-counter Tylenol and ibuprofen, salt water gargles, and Chloraseptic and Sucrets lozenges.   Final Clinical Impressions(s) / UC Diagnoses   Final diagnoses:  Upper respiratory tract infection, unspecified type     Discharge Instructions      Your test today was negative for both COVID and strep.  Your viral upper respiratory symptoms may be coming from your recent flu vaccination or you may be developing a viral respiratory infection.  Use the Atrovent nasal spray, 2 squirts up each nostril every 6 hours, as needed for nasal congestion.  Use the Magic  mouthwash for sore throat pain prior to meals and at bedtime.  Gargle and spit.  Gargle with warm salt water 2-3 times a day to soothe your throat, aid in pain relief, and aid in healing.  Take over-the-counter ibuprofen according to the package instructions as needed for pain.  You can also use Chloraseptic or Sucrets lozenges, 1 lozenge every 2 hours as needed for throat pain.  If you develop any new or worsening symptoms return for reevaluation.      ED  Prescriptions     Medication Sig Dispense Auth. Provider   ipratropium (ATROVENT) 0.06 % nasal spray Place 2 sprays into both nostrils 4 (four) times daily. 15 mL Margarette Canada, NP   magic mouthwash (lidocaine, diphenhydrAMINE, alum & mag hydroxide) suspension Swish and spit 5 mLs 4 (four) times daily as needed for mouth pain. 360 mL Margarette Canada, NP      PDMP not reviewed this encounter.   Margarette Canada, NP 02/19/22 1317

## 2022-02-19 NOTE — Discharge Instructions (Addendum)
Your test today was negative for both COVID and strep.  Your viral upper respiratory symptoms may be coming from your recent flu vaccination or you may be developing a viral respiratory infection.  Use the Atrovent nasal spray, 2 squirts up each nostril every 6 hours, as needed for nasal congestion.  Use the Magic mouthwash for sore throat pain prior to meals and at bedtime.  Gargle and spit.  Gargle with warm salt water 2-3 times a day to soothe your throat, aid in pain relief, and aid in healing.  Take over-the-counter ibuprofen according to the package instructions as needed for pain.  You can also use Chloraseptic or Sucrets lozenges, 1 lozenge every 2 hours as needed for throat pain.  If you develop any new or worsening symptoms return for reevaluation.

## 2022-02-24 ENCOUNTER — Ambulatory Visit
Admission: RE | Admit: 2022-02-24 | Discharge: 2022-02-24 | Disposition: A | Discharge status: Home / Self Care | Payer: Medicaid Other | Source: Ambulatory Visit | Attending: Family Medicine | Admitting: Family Medicine

## 2022-02-24 DIAGNOSIS — R2232 Localized swelling, mass and lump, left upper limb: Secondary | ICD-10-CM | POA: Diagnosis not present

## 2022-03-11 ENCOUNTER — Encounter: Payer: Self-pay | Admitting: Family Medicine

## 2022-03-11 ENCOUNTER — Ambulatory Visit (INDEPENDENT_AMBULATORY_CARE_PROVIDER_SITE_OTHER): Payer: Medicaid Other | Admitting: Family Medicine

## 2022-03-11 VITALS — BP 116/79 | HR 71 | Resp 16 | Wt 178.3 lb

## 2022-03-11 DIAGNOSIS — R55 Syncope and collapse: Secondary | ICD-10-CM

## 2022-03-11 DIAGNOSIS — B351 Tinea unguium: Secondary | ICD-10-CM

## 2022-03-11 DIAGNOSIS — D1722 Benign lipomatous neoplasm of skin and subcutaneous tissue of left arm: Secondary | ICD-10-CM | POA: Diagnosis not present

## 2022-03-11 MED ORDER — TERBINAFINE HCL 5 % EX SOLN: 1.0000 | Freq: Every day | CUTANEOUS | 2 refills | Status: DC

## 2022-03-11 MED ORDER — EFINACONAZOLE 10 % EX SOLN: 1.0000 "application " | Freq: Every day | CUTANEOUS | 2 refills | Status: DC

## 2022-03-11 NOTE — Progress Notes (Signed)
I,[name],acting as a scribe for [employer], MD.,have documented all relevant documentation on the behalf of [name], MD,as directed by  [name], MD while in the presence of [name], MD.   Established patient visit   Patient: Kaitlyn Turner   DOB: 1985-09-29   36 y.o. Female  MRN: [id number] Visit Date: 03/11/2022  Today's healthcare provider: [name], MD   Chief Complaint  Patient presents with   Follow-Up Syncopal Episodes   Subjective    HPI   Syncopal Episodes vs Panic Attacks  She states that she has had no episodes including increased anxiety and brief loss of consciousness since she was last seen 2 weeks ago  CMP, CBC and TSH/free T4 were collected at that time and normal except for mildly elevated free T4 Today she reports feeling better. Just a little flush on her face.    LUE Lipoma  Patient was last evaluated on 02/18/22 for nodule on upper part of her LUE  This was imaged via [place] and diagnosed as lipoma  Options for treatment include having it remove, recommend an MRI. Patient reports that is getting bigger, and has notice that her left arm hurts more in the inside like is pushing on something.   Medications: Outpatient Medications Prior to Visit  Medication Sig   ipratropium (ATROVENT) 0.06 % nasal spray Place 2 sprays into both nostrils 4 (four) times daily.   magic mouthwash (lidocaine, diphenhydrAMINE, alum & mag hydroxide) suspension Swish and spit 5 mLs 4 (four) times daily as needed for mouth pain.   NEXPLANON 68 MG IMPL implant    Prenatal Vit-Fe Fumarate-FA (PRENATAL MULTIVITAMIN) TABS tablet Take 1 tablet by mouth daily at 12 noon.   No facility-administered medications prior to visit.    Review of Systems     Objective    BP 116/79 (BP Location: Right Arm, Patient Position: Sitting, Cuff Size: Large)   Pulse 71   Resp 16   Wt 178 lb 4.8 oz (80.9 kg)    BMI 28.78 kg/m    Physical Exam Constitutional:      General: She is not in acute distress.    Appearance: She is not ill-appearing or toxic-appearing.  Pulmonary:     Effort: Pulmonary effort is normal.  Musculoskeletal:       Arms:     Right lower leg: No edema.     Left lower leg: No edema.     Comments: 2-2.5cm nodule, soft mobile subcutaneous nodule nontender without surrounding erythema        No results found for any visits on 03/11/22.    Assessment & Plan     Problem List Items Addressed This Visit       Musculoskeletal and Integument   Toenail fungus    New problem that has been present with toe nail changes for several months  We discussed recommendation for oral terbinafine after she transitions her daughter from breastfeeding as this medication systemically is excreted in breast milk  Patient prefers to follow up in 3 months and try topical therapy in the interim  Initially prescribed solution for terbinafine but pharmacy does not have this available so will prescribe efinaconazole solution as replacement  If not available, will recommend OTC topical antifungal treatments for mild onychomycosis         Relevant Medications   Terbinafine HCl 5 % SOLN   Efinaconazole 10 % SOLN   Other Relevant Orders   Ambulatory referral  to General Surgery     Other   Syncope, vasovagal    No additional episodes since last visit Will continue to monitor for any additional episodes       Subcutaneous nodule of left upper extremity - Primary    [place] with lipoma diagnosis  Patient experiencing symptoms with concern for enlargement  She is agreeable to referral for surgery for evaluation for removal  F/u in 3 months         Return in about 3 months (around 06/11/2022) for toe nail fungus .      I, [name], MD, have reviewed all documentation for this visit. The documentation on 03/11/22 for the exam, diagnosis, procedures, and orders are all  accurate and complete.  Portions of this information were initially documented by the CMA and reviewed by me for thoroughness and accuracy.      [name], MD  [facility] [phone] (phone) [phone] (fax)  [place]

## 2022-03-11 NOTE — Assessment & Plan Note (Signed)
New problem that has been present with toe nail changes for several months  We discussed recommendation for oral terbinafine after she transitions her daughter from breastfeeding as this medication systemically is excreted in breast milk  Patient prefers to follow up in 3 months and try topical therapy in the interim  Initially prescribed solution for terbinafine but pharmacy does not have this available so will prescribe efinaconazole solution as replacement  If not available, will recommend OTC topical antifungal treatments for mild onychomycosis

## 2022-03-11 NOTE — Assessment & Plan Note (Signed)
No additional episodes since last visit Will continue to monitor for any additional episodes

## 2022-03-11 NOTE — Assessment & Plan Note (Signed)
[place] with lipoma diagnosis  Patient experiencing symptoms with concern for enlargement  She is agreeable to referral for surgery for evaluation for removal  F/u in 3 months

## 2022-03-17 ENCOUNTER — Encounter: Payer: Self-pay | Admitting: Surgery

## 2022-03-17 ENCOUNTER — Ambulatory Visit (INDEPENDENT_AMBULATORY_CARE_PROVIDER_SITE_OTHER): Payer: Medicaid Other | Admitting: Surgery

## 2022-03-17 VITALS — BP 120/80 | HR 67 | Temp 98.1°F | Ht 66.0 in | Wt 178.0 lb

## 2022-03-17 DIAGNOSIS — D1722 Benign lipomatous neoplasm of skin and subcutaneous tissue of left arm: Secondary | ICD-10-CM

## 2022-03-17 DIAGNOSIS — Z419 Encounter for procedure for purposes other than remedying health state, unspecified: Secondary | ICD-10-CM | POA: Diagnosis not present

## 2022-03-17 NOTE — H&P (View-Only) (Signed)
03/17/2022  Reason for Visit:  Left axillary lipoma  Requesting Provider:  [name], MD  History of Present Illness: Kaitlyn Turner is a 36 y.o. female presenting for evaluation of a left axillary lipoma.  The patient reports that she's noticed this getting bigger over the past few months, about 3 months.  She reports this started small and is now bigger.  She reports some symptoms of tingliness along the medial upper arm and also some numbness in her left 2nd/3rd fingers.  Denies any motor weakness.  She had an ultrasound of the area on 02/24/22 which showed a 6.1 x 1.7 x 4.9 cm lipoma.  Past Medical History: Past Medical History:  Diagnosis Date   Anemia    Clotting disorder ([place])    Family history of alcoholism 01/08/2015   Per Centricity   Hypotension    Malignant hyperthermia    Mental disorder    Panic attacks   Pulmonary embolism ([place])    Pulmonary embolism (HCC)    Vaginal Pap smear, abnormal      Past Surgical History: Past Surgical History:  Procedure Laterality Date   DENTAL SURGERY      Home Medications: Prior to Admission medications   Medication Sig Start Date End Date Taking? Authorizing Provider  [place] implant  10/01/21  Yes [provider]  Prenatal Vit-Fe Fumarate-FA (PRENATAL MULTIVITAMIN) TABS tablet Take 1 tablet by mouth daily at 12 noon. 07/02/21  Yes Dickerson, Felicia Lucy Lorena, CNM  COLCRYS 0.6 MG tablet Take 1 tablet (0.6 mg total) by mouth daily. Patient not taking: Reported on 01/30/2020 06/26/19 05/30/20  Kate Sable, MD  pantoprazole (PROTONIX) 40 MG tablet Take 1 tablet (40 mg total) by mouth daily. For 1 month. Patient not taking: Reported on 01/30/2020 06/21/19 05/30/20  Kate Sable, MD  traZODone (DESYREL) 50 MG tablet Take 1 tablet (50 mg total) by mouth at bedtime and may repeat dose one time if needed. For sleep 08/23/18 01/06/19  Connye Burkitt, NP    Allergies: Allergies  Allergen  Reactions   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   Toradol [Ketorolac Tromethamine] Hives    Social History:  reports that she has never smoked. She has never used smokeless tobacco. She reports that she does not currently use alcohol. She reports that she does not use drugs.   Family History: Family History  Problem Relation Age of Onset   Cancer Mother        Unsure   Hypertension Father    Hypertension Maternal Grandmother    Heart disease Maternal Grandmother     Review of Systems: Review of Systems  Constitutional:  Negative for chills and fever.  Respiratory:  Negative for shortness of breath.   Cardiovascular:  Negative for chest pain.  Gastrointestinal:  Negative for abdominal pain, nausea and vomiting.  Genitourinary:  Negative for dysuria.  Musculoskeletal:  Negative for myalgias.  Skin:        Mass of left axilla  Neurological:  Positive for tingling.    Physical Exam BP 120/80   Pulse 67   Temp 98.1 F (36.7 C) (Oral)   Ht '5\' 6"'$  (1.676 m)   Wt 178 lb (80.7 kg)   SpO2 99%   BMI 28.73 kg/m  CONSTITUTIONAL: No acute distress, well nourished. HEENT:  Normocephalic, atraumatic, extraocular motion intact. NECK: Trachea is midline, and there is no jugular venous distension.  RESPIRATORY:  Lungs are clear, and breath sounds are equal bilaterally. Normal respiratory effort without pathologic use of accessory muscles. CARDIOVASCULAR: Heart is regular without murmurs, gallops, or rubs. MUSCULOSKELETAL:  Normal muscle strength and tone in all four extremities.  No peripheral edema or cyanosis. SKIN: The patient has an approximately 5 cm lipoma in the crease of the left axilla.  It is mobile, soft, non-tender.  There is some tingliness over the medial upper left arm when pushing on  it. NEUROLOGIC:  Motor and sensation is grossly normal.  Cranial nerves are grossly intact. PSYCH:  Alert and oriented to person, place and time. Affect is normal.  Laboratory Analysis: Labs from 02/18/22: Na 141, K 4.0, CL 104, CO2 23, BUN 15, Cr 0.78.  LFTs within normal.  WBC 7.1, Hgb 12.9, Hct 38.5, Plt 259.  Imaging: U/S Left axilla on 02/24/22: IMPRESSION: 1. A 6.1 x 1.7 x 4.9 cm simple lipoma along the left under arm and proximal humerus. Given the overall size, a MRI of the area is recommended without and with intravenous contrast for definitive characterization.  Assessment and Plan: This is a 36 y.o. female with a left axillary lipoma  --Discussed with the patient that the mass is soft and mobile, more likely to be a lipoma, particularly given the appearance on her U/S.  The tingliness may be related to the mass pushing on the intercostobrachial cutaneous nerve. --Discussed with the patient that given the symptoms, it is reasonable to proceed with excision.  Also given the symptoms and location, would be more prudent to do the procedure in the OR under anesthesia and better exposure.  She's in agreement.  --Reviewed the surgery at length with her including the incision, risks of bleeding, infection, injury to surrounding structures, possible numbness in the area, post-op activity restriction, pain control, and she's willing to proceed. --Will schedule her for 03/25/22.  I spent 40 minutes dedicated to the care of this patient on the date of this encounter to include pre-visit review of records, face-to-face time with the patient discussing diagnosis and management, and any post-visit coordination of care.   Melvyn Neth, [facility]

## 2022-03-17 NOTE — Patient Instructions (Addendum)
Our surgery scheduler Pamala Hurry will call you within 24-48 hours to get you scheduled. If you have not heard from her after 48 hours, please call our office. Have the blue sheet available when she calls to write down important information.   If you have any concerns or questions, please feel free to call our office.   Lipoma Removal  Lipoma removal is a surgical procedure to remove a lipoma, which is a noncancerous (benign) tumor that is made up of fat cells. Most lipomas are small and painless and do not require treatment. They can form in many areas of the body but are most common under the skin of the back, arms, shoulders, buttocks, and thighs. You may need lipoma removal if you have a lipoma that is large, growing, or causing discomfort. Lipoma removal may also be done for cosmetic reasons. Tell a health care provider about: Any allergies you have. All medicines you are taking, including vitamins, herbs, eye drops, creams, and over-the-counter medicines. Any problems you or family members have had with anesthetic medicines. Any bleeding problems you have. Any surgeries you have had. Any medical conditions you have. Whether you are pregnant or may be pregnant. What are the risks? Generally, this is a safe procedure. However, problems may occur, including: Infection. Bleeding. Scarring. Allergic reactions to medicines. Damage to nearby structures or organs, such as damage to nerves or blood vessels near the lipoma. What happens before the procedure? When to Stop Eating and Drinking Follow instructions from your health care provider about what you may eat and drink before your procedure. These may include: 8 hours before your procedure Stop eating most foods. Do not eat meat, fried foods, or fatty foods. Eat only light foods, such as toast or crackers. All liquids are okay except energy drinks and alcohol. 6 hours before your procedure Stop eating. Drink only clear liquids, such as  water, clear fruit juice, black coffee, plain tea, and sports drinks. Do not drink energy drinks or alcohol. 2 hours before your procedure Stop drinking all liquids. You may be allowed to take medicines with small sips of water. If you do not follow your health care provider's instructions, your procedure may be delayed or canceled. Medicines Ask your health care provider about: Changing or stopping your regular medicines. This is especially important if you are taking diabetes medicines or blood thinners. Taking medicines such as aspirin and ibuprofen. These medicines can thin your blood. Do not take these medicines unless your health care provider tells you to take them. Taking over-the-counter medicines, vitamins, herbs, and supplements. General instructions You will have a physical exam. Your health care provider will check the size of the lipoma and whether it can be removed easily. You may have a biopsy and imaging tests, such as X-rays, a CT scan, and an MRI. Do not use any products that contain nicotine or tobacco for at least 4 weeks before the procedure. These products include cigarettes, chewing tobacco, and vaping devices, such as e-cigarettes. If you need help quitting, ask your health care provider. Ask your health care provider: How your surgery site will be marked. What steps will be taken to help prevent infection. These may include: Washing skin with a germ-killing soap. Taking antibiotic medicine. If you will be going home right after the procedure, plan to have a responsible adult: Take you home from the hospital or clinic. You will not be allowed to drive. Care for you for the time you are told. What happens during  the procedure?  An IV will be inserted into one of your veins. You will be given one or more of the following: A medicine to help you relax (sedative). A medicine to numb the area (local anesthetic). A medicine to make you fall asleep (general  anesthetic). A medicine that is injected into an area of your body to numb everything below the injection site (regional anesthetic). An incision will be made into the skin over the lipoma or very near the lipoma. The incision may be made in a natural skin line or crease. Tissues, nerves, and blood vessels near the lipoma will be moved out of the way. The lipoma and the capsule that surrounds it will be separated from the surrounding tissues. The lipoma will be removed. The incision may be closed with stitches (sutures). A bandage (dressing) will be placed over the incision. The procedure may vary among health care providers and hospitals. What happens after the procedure? Your blood pressure, heart rate, breathing rate, and blood oxygen level will be monitored until you leave the hospital or clinic. If you were prescribed an antibiotic medicine, use it as told by your health care provider. Do not stop using the antibiotic even if you start to feel better. If you were given a sedative during the procedure, it can affect you for several hours. Do not drive or operate machinery until your health care provider says that it is safe. Where to find more information OrthoInfo: orthoinfo.aaos.org Summary Before the procedure, follow instructions from your health care provider about eating and drinking, and changing or stopping your regular medicines. This is especially important if you are taking diabetes medicines or blood thinners. After the lipoma is removed, the incision may be closed with stitches (sutures) and covered with a bandage (dressing). If you were given a sedative during the procedure, it can affect you for several hours. Do not drive or operate machinery until your health care provider says that it is safe. This information is not intended to replace advice given to you by your health care provider. Make sure you discuss any questions you have with your health care provider. Document  Revised: 05/22/2021 Document Reviewed: 05/22/2021 Elsevier Patient Education  [place].

## 2022-03-17 NOTE — Progress Notes (Signed)
03/17/2022  Reason for Visit:  Left axillary lipoma  Requesting Provider:  [name], MD  History of Present Illness: JOSELINNE Turner is a 36 y.o. female presenting for evaluation of a left axillary lipoma.  The patient reports that she's noticed this getting bigger over the past few months, about 3 months.  She reports this started small and is now bigger.  She reports some symptoms of tingliness along the medial upper arm and also some numbness in her left 2nd/3rd fingers.  Denies any motor weakness.  She had an ultrasound of the area on 02/24/22 which showed a 6.1 x 1.7 x 4.9 cm lipoma.  Past Medical History: Past Medical History:  Diagnosis Date   Anemia    Clotting disorder ([place])    Family history of alcoholism 01/08/2015   Per Centric[employer]   Hypotension    Malignant hyperthermia    Mental disorder    Panic attacks   Pulmonary embolism ([place])    Pulmonary embolism ([facility])    Vaginal Pap smear, abnormal      Past Surgical History: Past Surgical History:  Procedure Laterality Date   DENTAL SURGERY      Home Medications: Prior to Admission medications   Medication Sig Start Date End Date Taking? Authorizing Provider  [place] implant  10/01/21  Yes [provider]  Prenatal Vit-Fe Fumarate-FA (PRENATAL MULTIVITAMIN) TABS tablet Take 1 tablet by mouth daily at 12 noon. 07/02/21  Yes Dickerson, Felicia Lucy Lorena, CNM  COLCRYS 0.6 MG tablet Take 1 tablet (0.6 mg total) by mouth daily. Patient not taking: Reported on 01/30/2020 06/26/19 05/30/20  Kate Sable, MD  pantoprazole (PROTONIX) 40 MG tablet Take 1 tablet (40 mg total) by mouth daily. For 1 month. Patient not taking: Reported on 01/30/2020 06/21/19 05/30/20  Kate Sable, MD  traZODone (DESYREL) 50 MG tablet Take 1 tablet (50 mg total) by mouth at bedtime and may repeat dose one time if needed. For sleep 08/23/18 01/06/19  Connye Burkitt, NP    Allergies: Allergies  Allergen  Reactions   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   Toradol [Ketorolac Tromethamine] Hives    Social History:  reports that she has never smoked. She has never used smokeless tobacco. She reports that she does not currently use alcohol. She reports that she does not use drugs.   Family History: Family History  Problem Relation Age of Onset   Cancer Mother        Unsure   Hypertension Father    Hypertension Maternal Grandmother    Heart disease Maternal Grandmother     Review of Systems: Review of Systems  Constitutional:  Negative for chills and fever.  Respiratory:  Negative for shortness of breath.   Cardiovascular:  Negative for chest pain.  Gastrointestinal:  Negative for abdominal pain, nausea and vomiting.  Genitourinary:  Negative for dysuria.  Musculoskeletal:  Negative for myalgias.  Skin:        Mass of left axilla  Neurological:  Positive for tingling.    Physical Exam BP 120/80   Pulse 67   Temp 98.1 F (36.7 C) (Oral)   Ht '5\' 6"'$  (1.676 m)   Wt 178 lb (80.7 kg)   SpO2 99%   BMI 28.73 kg/m  CONSTITUTIONAL: No acute distress, well nourished. HEENT:  Normocephalic, atraumatic, extraocular motion intact. NECK: Trachea is midline, and there is no jugular venous distension.  RESPIRATORY:  Lungs are clear, and breath sounds are equal bilaterally. Normal respiratory effort without pathologic use of accessory muscles. CARDIOVASCULAR: Heart is regular without murmurs, gallops, or rubs. MUSCULOSKELETAL:  Normal muscle strength and tone in all four extremities.  No peripheral edema or cyanosis. SKIN: The patient has an approximately 5 cm lipoma in the crease of the left axilla.  It is mobile, soft, non-tender.  There is some tingliness over the medial upper left arm when pushing on  it. NEUROLOGIC:  Motor and sensation is grossly normal.  Cranial nerves are grossly intact. PSYCH:  Alert and oriented to person, place and time. Affect is normal.  Laboratory Analysis: Labs from 02/18/22: Na 141, K 4.0, CL 104, CO2 23, BUN 15, Cr 0.78.  LFTs within normal.  WBC 7.1, Hgb 12.9, Hct 38.5, Plt 259.  Imaging: U/S Left axilla on 02/24/22: IMPRESSION: 1. A 6.1 x 1.7 x 4.9 cm simple lipoma along the left under arm and proximal humerus. Given the overall size, a MRI of the area is recommended without and with intravenous contrast for definitive characterization.  Assessment and Plan: This is a 36 y.o. female with a left axillary lipoma  --Discussed with the patient that the mass is soft and mobile, more likely to be a lipoma, particularly given the appearance on her U/S.  The tingliness may be related to the mass pushing on the intercostobrachial cutaneous nerve. --Discussed with the patient that given the symptoms, it is reasonable to proceed with excision.  Also given the symptoms and location, would be more prudent to do the procedure in the OR under anesthesia and better exposure.  She's in agreement.  --Reviewed the surgery at length with her including the incision, risks of bleeding, infection, injury to surrounding structures, possible numbness in the area, post-op activity restriction, pain control, and she's willing to proceed. --Will schedule her for 03/25/22.  I spent 40 minutes dedicated to the care of this patient on the date of this encounter to include pre-visit review of records, face-to-face time with the patient discussing diagnosis and management, and any post-visit coordination of care.   Melvyn Neth, [facility]

## 2022-03-18 ENCOUNTER — Telehealth: Payer: Self-pay | Admitting: Surgery

## 2022-03-18 NOTE — Telephone Encounter (Signed)
Patient has been advised of Pre-Admission date/time, and Surgery date at [facility].  Surgery Date: 03/25/22 Preadmission Testing Date: 03/24/22 (phone 8a-1p)  Patient has been made aware to call [phone], between 1-3:00pm the day before surgery, to find out what time to arrive for surgery.

## 2022-03-19 NOTE — Anesthesia Preprocedure Evaluation (Deleted)
Anesthesia Evaluation    Airway        Dental   Pulmonary           Cardiovascular      Neuro/Psych    GI/Hepatic   Endo/Other    Renal/GU      Musculoskeletal   Abdominal   Peds  Hematology   Anesthesia Other Findings   Reproductive/Obstetrics                              Anesthesia Physical Anesthesia Plan Anesthesia Quick Evaluation

## 2022-03-19 NOTE — Progress Notes (Addendum)
  Perioperative Services Pre-Admission/Anesthesia Testing    Date: 03/19/22  Name: Kaitlyn Turner MRN:   [id number]  Re: (+) MALIGNANT HYPERTHERMIA   Planned Surgical Procedure(s):     Case: [id number] Date/Time: 03/25/22 0715   Procedure: EXCISION LIPOMA, left axillary (Left)   Anesthesia type: General   Pre-op diagnosis: left axillary lipoma   Location: [facility] OR ROOM 06 / [place] ORS FOR ANESTHESIA GROUP   Surgeons: Olean Ree, MD   Clinical Notes:  Patient scheduled for the above procedure on 03/25/2022 with Dr. Olean Ree, MD. In review of her past medical history, it was noted that patient has a (+) familial history of MALIGNANT HYPERTHERMIA.  In efforts to ensure procedural safety during anesthesia administration, ensured that this information was included in the patient's medical history for review by the patient's surgical/anesthesia team.  Additionally, OR administrative staff Milly Jakob, RN and Vicente Serene, Immunologist) was made aware so that any appropriate/necessary precautions can be taken prior to the patient arriving to campus for her  planned surgical procedure.   Honor Loh, MSN, APRN, FNP-C, CEN [facility]  Peri-operative Services Nurse Practitioner Phone: [phone] 03/19/22 9:45 AM  NOTE: This note has been prepared using Dragon dictation software. Despite my best ability to proofread, there is always the potential that unintentional transcriptional errors may still occur from this process.

## 2022-03-24 ENCOUNTER — Encounter
Admission: RE | Admit: 2022-03-24 | Discharge: 2022-03-24 | Disposition: A | Discharge status: Home / Self Care | Payer: Medicaid Other | Source: Ambulatory Visit | Attending: Surgery | Admitting: Surgery

## 2022-03-24 DIAGNOSIS — Z01818 Encounter for other preprocedural examination: Secondary | ICD-10-CM

## 2022-03-24 HISTORY — DX: Other specified health status: Z78.9

## 2022-03-24 HISTORY — DX: Personal history of other mental and behavioral disorders: Z86.59

## 2022-03-24 HISTORY — DX: Encounter for adoption services: Z02.82

## 2022-03-24 HISTORY — DX: Family history of other specified conditions: Z84.89

## 2022-03-24 HISTORY — DX: Depression, unspecified: F32.A

## 2022-03-24 MED ORDER — ACETAMINOPHEN 500 MG PO TABS: 1000.0000 mg | ORAL_TABLET | ORAL | Status: AC

## 2022-03-24 MED ORDER — CHLORHEXIDINE GLUCONATE CLOTH 2 % EX PADS
6.0000 | MEDICATED_PAD | Freq: Once | CUTANEOUS | Status: AC
  Administered 2022-03-25: 6 via TOPICAL

## 2022-03-24 MED ORDER — GABAPENTIN 300 MG PO CAPS: 300.0000 mg | ORAL_CAPSULE | ORAL | Status: AC

## 2022-03-24 MED ORDER — BUPIVACAINE LIPOSOME 1.3 % IJ SUSP: 20.0000 mL | Freq: Once | INTRAMUSCULAR | Status: DC

## 2022-03-24 MED ORDER — FAMOTIDINE 20 MG PO TABS: 20.0000 mg | ORAL_TABLET | Freq: Once | ORAL | Status: AC

## 2022-03-24 MED ORDER — CHLORHEXIDINE GLUCONATE 0.12 % MT SOLN: 15.0000 mL | Freq: Once | OROMUCOSAL | Status: AC

## 2022-03-24 MED ORDER — CIPROFLOXACIN IN D5W 400 MG/200ML IV SOLN
400.0000 mg | INTRAVENOUS | Status: AC
  Administered 2022-03-25: 400 mg via INTRAVENOUS

## 2022-03-24 MED ORDER — CHLORHEXIDINE GLUCONATE CLOTH 2 % EX PADS: 6.0000 | MEDICATED_PAD | Freq: Once | CUTANEOUS | Status: DC

## 2022-03-24 MED ORDER — ORAL CARE MOUTH RINSE: 15.0000 mL | Freq: Once | OROMUCOSAL | Status: AC

## 2022-03-24 MED ORDER — LACTATED RINGERS IV SOLN: INTRAVENOUS | Status: DC

## 2022-03-25 ENCOUNTER — Ambulatory Visit
Admission: RE | Admit: 2022-03-25 | Discharge: 2022-03-25 | Disposition: A | Discharge status: Home / Self Care | Payer: Medicaid Other | Source: Ambulatory Visit | Attending: Surgery | Admitting: Surgery

## 2022-03-25 ENCOUNTER — Ambulatory Visit: Payer: Medicaid Other | Admitting: Urgent Care

## 2022-03-25 ENCOUNTER — Other Ambulatory Visit: Payer: Self-pay

## 2022-03-25 ENCOUNTER — Encounter
Admission: RE | Disposition: A | Discharge status: Home / Self Care | Payer: Self-pay | Source: Ambulatory Visit | Attending: Surgery

## 2022-03-25 ENCOUNTER — Encounter: Payer: Self-pay | Admitting: Surgery

## 2022-03-25 DIAGNOSIS — Z86711 Personal history of pulmonary embolism: Secondary | ICD-10-CM | POA: Insufficient documentation

## 2022-03-25 DIAGNOSIS — F32A Depression, unspecified: Secondary | ICD-10-CM | POA: Diagnosis not present

## 2022-03-25 DIAGNOSIS — D1722 Benign lipomatous neoplasm of skin and subcutaneous tissue of left arm: Secondary | ICD-10-CM | POA: Diagnosis not present

## 2022-03-25 DIAGNOSIS — D1779 Benign lipomatous neoplasm of other sites: Secondary | ICD-10-CM | POA: Diagnosis not present

## 2022-03-25 DIAGNOSIS — Z79899 Other long term (current) drug therapy: Secondary | ICD-10-CM | POA: Diagnosis not present

## 2022-03-25 DIAGNOSIS — D171 Benign lipomatous neoplasm of skin and subcutaneous tissue of trunk: Secondary | ICD-10-CM | POA: Diagnosis not present

## 2022-03-25 DIAGNOSIS — Z01818 Encounter for other preprocedural examination: Secondary | ICD-10-CM

## 2022-03-25 HISTORY — PX: LIPOMA EXCISION: SHX5283

## 2022-03-25 LAB — POCT PREGNANCY, URINE: Preg Test, Ur: NEGATIVE

## 2022-03-25 SURGERY — EXCISION LIPOMA
Anesthesia: General | Site: Axilla | Laterality: Left

## 2022-03-25 MED ORDER — LIDOCAINE HCL (CARDIAC) PF 100 MG/5ML IV SOSY
PREFILLED_SYRINGE | INTRAVENOUS | Status: DC | PRN
  Administered 2022-03-25: 100 mg via INTRAVENOUS

## 2022-03-25 MED ORDER — SODIUM CHLORIDE (PF) 0.9 % IJ SOLN
INTRAMUSCULAR | Status: AC
  Filled 2022-03-25: qty 50

## 2022-03-25 MED ORDER — KETAMINE HCL 10 MG/ML IJ SOLN
INTRAMUSCULAR | Status: DC | PRN
  Administered 2022-03-25: 30 mg via INTRAVENOUS

## 2022-03-25 MED ORDER — FENTANYL CITRATE (PF) 100 MCG/2ML IJ SOLN
INTRAMUSCULAR | Status: AC
  Filled 2022-03-25: qty 2

## 2022-03-25 MED ORDER — PROPOFOL 10 MG/ML IV BOLUS
INTRAVENOUS | Status: DC | PRN
  Administered 2022-03-25: 30 mg via INTRAVENOUS
  Administered 2022-03-25: 200 mg via INTRAVENOUS
  Administered 2022-03-25: 50 mg via INTRAVENOUS

## 2022-03-25 MED ORDER — CIPROFLOXACIN IN D5W 400 MG/200ML IV SOLN
INTRAVENOUS | Status: AC
  Filled 2022-03-25: qty 200

## 2022-03-25 MED ORDER — BUPIVACAINE-EPINEPHRINE (PF) 0.5% -1:200000 IJ SOLN
INTRAMUSCULAR | Status: DC | PRN
  Administered 2022-03-25: 20 mL

## 2022-03-25 MED ORDER — KETAMINE HCL 50 MG/5ML IJ SOSY
PREFILLED_SYRINGE | INTRAMUSCULAR | Status: AC
  Filled 2022-03-25: qty 5

## 2022-03-25 MED ORDER — FAMOTIDINE 20 MG PO TABS
ORAL_TABLET | ORAL | Status: AC
  Administered 2022-03-25: 20 mg via ORAL
  Filled 2022-03-25: qty 1

## 2022-03-25 MED ORDER — GABAPENTIN 300 MG PO CAPS
ORAL_CAPSULE | ORAL | Status: AC
  Administered 2022-03-25: 300 mg via ORAL
  Filled 2022-03-25: qty 1

## 2022-03-25 MED ORDER — FENTANYL CITRATE (PF) 100 MCG/2ML IJ SOLN
INTRAMUSCULAR | Status: DC | PRN
  Administered 2022-03-25: 50 ug via INTRAVENOUS

## 2022-03-25 MED ORDER — PROPOFOL 10 MG/ML IV BOLUS
INTRAVENOUS | Status: AC
  Filled 2022-03-25: qty 20

## 2022-03-25 MED ORDER — EPHEDRINE SULFATE (PRESSORS) 50 MG/ML IJ SOLN
INTRAMUSCULAR | Status: DC | PRN
  Administered 2022-03-25: 2.5 mg via INTRAVENOUS

## 2022-03-25 MED ORDER — ACETAMINOPHEN 500 MG PO TABS
ORAL_TABLET | ORAL | Status: AC
  Administered 2022-03-25: 1000 mg via ORAL
  Filled 2022-03-25: qty 2

## 2022-03-25 MED ORDER — CHLORHEXIDINE GLUCONATE 0.12 % MT SOLN
OROMUCOSAL | Status: AC
  Administered 2022-03-25: 15 mL via OROMUCOSAL
  Filled 2022-03-25: qty 15

## 2022-03-25 MED ORDER — DEXAMETHASONE SODIUM PHOSPHATE 10 MG/ML IJ SOLN
INTRAMUSCULAR | Status: DC | PRN
  Administered 2022-03-25: 10 mg via INTRAVENOUS

## 2022-03-25 MED ORDER — ACETAMINOPHEN 500 MG PO TABS: 1000.0000 mg | ORAL_TABLET | Freq: Four times a day (QID) | ORAL | Status: DC | PRN

## 2022-03-25 MED ORDER — MIDAZOLAM HCL 2 MG/2ML IJ SOLN
INTRAMUSCULAR | Status: AC
  Filled 2022-03-25: qty 2

## 2022-03-25 MED ORDER — OXYCODONE HCL 5 MG PO TABS: 5.0000 mg | ORAL_TABLET | ORAL | 0 refills | Status: DC | PRN

## 2022-03-25 MED ORDER — MIDAZOLAM HCL 5 MG/5ML IJ SOLN
INTRAMUSCULAR | Status: DC | PRN
  Administered 2022-03-25: 2 mg via INTRAVENOUS

## 2022-03-25 MED ORDER — BUPIVACAINE LIPOSOME 1.3 % IJ SUSP
INTRAMUSCULAR | Status: AC
  Filled 2022-03-25: qty 20

## 2022-03-25 MED ORDER — BUPIVACAINE-EPINEPHRINE (PF) 0.5% -1:200000 IJ SOLN
INTRAMUSCULAR | Status: AC
  Filled 2022-03-25: qty 30

## 2022-03-25 MED ORDER — PROPOFOL 500 MG/50ML IV EMUL
INTRAVENOUS | Status: DC | PRN
  Administered 2022-03-25: 175 ug/kg/min via INTRAVENOUS

## 2022-03-25 MED ORDER — ONDANSETRON HCL 4 MG/2ML IJ SOLN
INTRAMUSCULAR | Status: DC | PRN
  Administered 2022-03-25: 4 mg via INTRAVENOUS

## 2022-03-25 MED ORDER — BUPIVACAINE LIPOSOME 1.3 % IJ SUSP
INTRAMUSCULAR | Status: AC
  Filled 2022-03-25: qty 10

## 2022-03-25 MED ORDER — PROPOFOL 1000 MG/100ML IV EMUL
INTRAVENOUS | Status: AC
  Filled 2022-03-25: qty 100

## 2022-03-25 MED ORDER — BUPIVACAINE HCL (PF) 0.5 % IJ SOLN
INTRAMUSCULAR | Status: AC
  Filled 2022-03-25: qty 30

## 2022-03-25 MED ORDER — DEXMEDETOMIDINE HCL IN NACL 200 MCG/50ML IV SOLN
INTRAVENOUS | Status: DC | PRN
  Administered 2022-03-25: 8 ug via INTRAVENOUS

## 2022-03-25 MED ORDER — PHENYLEPHRINE HCL (PRESSORS) 10 MG/ML IV SOLN
INTRAVENOUS | Status: DC | PRN
  Administered 2022-03-25 (×3): 80 ug via INTRAVENOUS

## 2022-03-25 SURGICAL SUPPLY — 34 items
ADH SKN CLS APL DERMABOND .7 (GAUZE/BANDAGES/DRESSINGS) ×1
APL PRP STRL LF DISP 70% ISPRP (MISCELLANEOUS) ×1
CHLORAPREP W/TINT 26 (MISCELLANEOUS) ×1 IMPLANT
DERMABOND ADVANCED .7 DNX12 (GAUZE/BANDAGES/DRESSINGS) ×1 IMPLANT
DRAPE 3/4 80X56 (DRAPES) ×2 IMPLANT
DRAPE LAPAROTOMY 100X77 ABD (DRAPES) ×1 IMPLANT
ELECT CAUTERY BLADE TIP 2.5 (TIP) ×1
ELECT REM PT RETURN 9FT ADLT (ELECTROSURGICAL) ×1
ELECTRODE CAUTERY BLDE TIP 2.5 (TIP) ×1 IMPLANT
ELECTRODE REM PT RTRN 9FT ADLT (ELECTROSURGICAL) ×1 IMPLANT
GAUZE 4X4 16PLY ~~LOC~~+RFID DBL (SPONGE) ×1 IMPLANT
GLOVE SURG SYN 7.0 (GLOVE) ×1 IMPLANT
GLOVE SURG SYN 7.0 PF PI (GLOVE) ×1 IMPLANT
GLOVE SURG SYN 7.5  E (GLOVE) ×1
GLOVE SURG SYN 7.5 E (GLOVE) ×1 IMPLANT
GLOVE SURG SYN 7.5 PF PI (GLOVE) ×1 IMPLANT
GOWN STRL REUS W/ TWL LRG LVL3 (GOWN DISPOSABLE) ×2 IMPLANT
GOWN STRL REUS W/TWL LRG LVL3 (GOWN DISPOSABLE) ×2
KIT TURNOVER KIT A (KITS) ×1 IMPLANT
LABEL OR SOLS (LABEL) ×1 IMPLANT
MANIFOLD NEPTUNE II (INSTRUMENTS) ×1 IMPLANT
NEEDLE HYPO 22GX1.5 SAFETY (NEEDLE) ×1 IMPLANT
NS IRRIG 1000ML POUR BTL (IV SOLUTION) ×1 IMPLANT
PACK BASIN MINOR ARMC (MISCELLANEOUS) ×1 IMPLANT
SUT MNCRL 4-0 (SUTURE) ×1
SUT MNCRL 4-0 27XMFL (SUTURE) ×1
SUT VIC AB 0 SH 27 (SUTURE) ×2 IMPLANT
SUT VIC AB 2-0 SH 27 (SUTURE) ×1
SUT VIC AB 2-0 SH 27XBRD (SUTURE) IMPLANT
SUT VIC AB 3-0 SH 27 (SUTURE) ×2
SUT VIC AB 3-0 SH 27X BRD (SUTURE) ×2 IMPLANT
SUTURE MNCRL 4-0 27XMF (SUTURE) ×1 IMPLANT
SYR 30ML LL (SYRINGE) ×1 IMPLANT
TRAP FLUID SMOKE EVACUATOR (MISCELLANEOUS) ×1 IMPLANT

## 2022-03-25 NOTE — Op Note (Signed)
  Procedure Date:  03/25/2022  Pre-operative Diagnosis:  Left axillary lipoma  Post-operative Diagnosis: Left axillary lipoma, 4 x 7 cm  Procedure:  Excision of left axillary lipoma  Surgeon:  Melvyn Neth, MD  Anesthesia:  General endotracheal  Estimated Blood Loss:  5 ml  Specimens:  Left axillary lipoma  Complications:  None  Indications for Procedure:  This is a 36 y.o. female with diagnosis of a symptomatic left axillary lipoma.  The patient wishes to have this excised. The risks of bleeding, abscess or infection, injury to surrounding structures, and need for further procedures were all discussed with the patient and she was willing to proceed.  Description of Procedure: The patient was correctly identified in the preoperative area and brought into the operating room.  The patient was placed supine with VTE prophylaxis in place.  Appropriate time-outs were performed.  Anesthesia was induced and the patient was intubated.  Appropriate antibiotics were infused.  The patient's left axilla was prepped and draped in usual sterile fashion.  A 6 cm incision was made over the lipoma, and cautery was used to dissect down the subcutaneous tissue to the lipoma itself.  Skin flaps were created using cautery and blunt dissection, and then the lipoma was excised, intact.  A small nerve that was running proximally to the mass was identified and pushed off the lipoma without any cautery use to prevent injury.  The mass was sent off to pathology.  The cavity was then irrigated and hemostasis was assured with cautery.  Local anesthetic was infused intradermally.  The wound was then closed in three layers using 2-0 Vicryl, 3-0 Vicryl and 4-0 Monocryl.  The incision was cleaned and sealed with DermaBond.  The patient was then emerged from anesthesia, extubated, and brought to the recovery room for further management.    The patient tolerated the procedure well and all counts were correct at the end  of the case.   Melvyn Neth, MD

## 2022-03-25 NOTE — Anesthesia Preprocedure Evaluation (Addendum)
Anesthesia Evaluation  Patient identified by MRN, date of birth, ID band Patient awake    Reviewed: Allergy & Precautions, NPO status , Patient's Chart, lab work & pertinent test results  History of Anesthesia Complications (+) MALIGNANT HYPERTHERMIA and history of anesthetic complications  Airway Mallampati: II  TM Distance: >3 FB Neck ROM: full    Dental  (+) Teeth Intact   Pulmonary neg pulmonary ROS   Pulmonary exam normal        Cardiovascular Exercise Tolerance: Good negative cardio ROS Normal cardiovascular exam     Neuro/Psych  PSYCHIATRIC DISORDERS  Depression    negative neurological ROS     GI/Hepatic negative GI ROS, Neg liver ROS,,,  Endo/Other  negative endocrine ROS    Renal/GU negative Renal ROS  negative genitourinary   Musculoskeletal   Abdominal   Peds  Hematology negative hematology ROS (+)   Anesthesia Other Findings Past Medical History: No date: Adopted No date: Anemia No date: Clotting disorder ([place]) No date: Depression No date: Family history of adverse reaction to anesthesia     Comment:  pt is adopted and is unsure of her biological moms               history 01/08/2015: Family history of alcoholism     Comment:  Per [employer]ric[employer] No date: History of suicidal ideation No date: Hypotension No date: Malignant hyperthermia     Comment:  during [place] with dental procedure when she was a toddler No date: Mental disorder     Comment:  Panic attacks 2013: Pulmonary embolism ([place])     Comment:  was hospitalized for 2 weeks-saw hematologist but all               testing was negative No date: Vaginal Pap smear, abnormal  Past Surgical History: No date: DENTAL SURGERY     Comment:  as a toddler  BMI    Body Mass Index: 28.72 kg/m      Reproductive/Obstetrics negative OB ROS                             Anesthesia Physical Anesthesia Plan  ASA:  2  Anesthesia Plan: General   Post-op Pain Management: Tylenol PO (pre-op)*, Gabapentin PO (pre-op)* and Toradol IV (intra-op)*   Induction: Intravenous  PONV Risk Score and Plan: 2 and Propofol infusion and TIVA  Airway Management Planned: LMA  Additional Equipment:   Intra-op Plan:   Post-operative Plan: Extubation in OR  Informed Consent: I have reviewed the patients History and Physical, chart, labs and discussed the procedure including the risks, benefits and alternatives for the proposed anesthesia with the patient or authorized representative who has indicated his/her understanding and acceptance.     Dental Advisory Given  Plan Discussed with: Anesthesiologist, CRNA and Surgeon  Anesthesia Plan Comments: (Patient consented for risks of anesthesia including but not limited to:  - adverse reactions to medications - damage to eyes, teeth, lips or other oral mucosa - nerve damage due to positioning  - sore throat or hoarseness - Damage to heart, brain, nerves, lungs, other parts of body or loss of life  Patient voiced understanding.)       Anesthesia Quick Evaluation

## 2022-03-25 NOTE — Anesthesia Procedure Notes (Signed)
Procedure Name: LMA Insertion Date/Time: 03/25/2022 7:40 AM  Performed by: Donnie Mesa, RNPre-anesthesia Checklist: Patient identified, Emergency Drugs available, Suction available and Patient being monitored Patient Re-evaluated:Patient Re-evaluated prior to induction Oxygen Delivery Method: Circle system utilized Preoxygenation: Pre-oxygenation with 100% oxygen Induction Type: IV induction Ventilation: Mask ventilation without difficulty LMA: LMA inserted LMA Size: 4.0 Tube type: Oral Number of attempts: 1 Placement Confirmation: positive ETCO2 and breath sounds checked- equal and bilateral Tube secured with: Tape Dental Injury: Teeth and Oropharynx as per pre-operative assessment  Comments: Performed under direct supervision by CRNA and MD

## 2022-03-25 NOTE — Transfer of Care (Signed)
Immediate Anesthesia Transfer of Care Note  Patient: Kaitlyn Turner  Procedure(s) Performed: EXCISION LIPOMA, left axillary (Left: Axilla)  Patient Location: PACU  Anesthesia Type:General  Level of Consciousness: awake  Airway & Oxygen Therapy: Patient Spontanous Breathing  Post-op Assessment: Report given to RN  Post vital signs: Reviewed  Last Vitals:  Vitals Value Taken Time  BP    Temp    Pulse    Resp    SpO2      Last Pain:  Vitals:   03/25/22 0619  TempSrc: Temporal  PainSc: 0-No pain         Complications: No notable events documented.

## 2022-03-25 NOTE — Discharge Instructions (Signed)
AMBULATORY SURGERY  DISCHARGE INSTRUCTIONS   The drugs that you were given will stay in your system until tomorrow so for the next 24 hours you should not:  Drive an automobile Make any legal decisions Drink any alcoholic beverage   You may resume regular meals tomorrow.  Today it is better to start with liquids and gradually work up to solid foods.  You may eat anything you prefer, but it is better to start with liquids, then soup and crackers, and gradually work up to solid foods.   Please notify your doctor immediately if you have any unusual bleeding, trouble breathing, redness and pain at the surgery site, drainage, fever, or pain not relieved by medication.    Additional Instructions:    PLEASE LEAVE GREEN ARMBAND ON FOR 4 DAYS    Please contact your physician with any problems or Same Day Surgery at [phone], Monday through Friday 6 am to 4 pm, or [place] at [facility] number at [phone].

## 2022-03-25 NOTE — Anesthesia Postprocedure Evaluation (Signed)
Anesthesia Post Note  Patient: Kaitlyn Turner  Procedure(s) Performed: EXCISION LIPOMA, left axillary (Left: Axilla)  Patient location during evaluation: PACU Anesthesia Type: General Level of consciousness: awake and alert Pain management: pain level controlled Vital Signs Assessment: post-procedure vital signs reviewed and stable Respiratory status: spontaneous breathing, nonlabored ventilation, respiratory function stable and patient connected to nasal cannula oxygen Cardiovascular status: blood pressure returned to baseline and stable Postop Assessment: no apparent nausea or vomiting Anesthetic complications: no   No notable events documented.   Last Vitals:  Vitals:   03/25/22 0900 03/25/22 0915  BP: 109/74 (!) 108/58  Pulse: 75 69  Resp: 16 15  Temp:  36.4 C  SpO2: 100% 100%    Last Pain:  Vitals:   03/25/22 0915  TempSrc:   PainSc: 0-No pain                 Ilene Qua

## 2022-03-25 NOTE — Interval H&P Note (Signed)
History and Physical Interval Note:  03/25/2022 7:14 AM  Kaitlyn Turner  has presented today for surgery, with the diagnosis of left axillary lipoma.  The various methods of treatment have been discussed with the patient and family. After consideration of risks, benefits and other options for treatment, the patient has consented to  Procedure(s): EXCISION LIPOMA, left axillary (Left) as a surgical intervention.  The patient's history has been reviewed, patient examined, no change in status, stable for surgery.  I have reviewed the patient's chart and labs.  Questions were answered to the patient's satisfaction.     [name]

## 2022-03-26 LAB — SURGICAL PATHOLOGY

## 2022-03-30 ENCOUNTER — Ambulatory Visit: Payer: Self-pay | Admitting: Family Medicine

## 2022-03-30 ENCOUNTER — Encounter: Payer: Self-pay | Admitting: Surgery

## 2022-03-31 ENCOUNTER — Telehealth: Payer: Self-pay

## 2022-03-31 NOTE — Telephone Encounter (Signed)
Patient denies redness or swelling. She stated there was some mild burning in the area of the incision but it didn't look infected. She stated she took zofran for the nausea and vomiting and feels better. Also took tylenol. She ws instructed to sip on clear fluids for the next several hours and if the incision becomes red, painful and has any drainage to call the office. We discussed that a stomach bug and strep throat is going around.

## 2022-04-01 ENCOUNTER — Encounter: Payer: Self-pay | Admitting: Physician Assistant

## 2022-04-01 ENCOUNTER — Other Ambulatory Visit: Payer: Self-pay

## 2022-04-01 ENCOUNTER — Ambulatory Visit (INDEPENDENT_AMBULATORY_CARE_PROVIDER_SITE_OTHER): Payer: Medicaid Other | Admitting: Physician Assistant

## 2022-04-01 ENCOUNTER — Encounter: Payer: Self-pay | Admitting: Surgery

## 2022-04-01 VITALS — BP 108/76 | HR 64 | Temp 98.0°F | Ht 66.0 in | Wt 177.0 lb

## 2022-04-01 DIAGNOSIS — D1722 Benign lipomatous neoplasm of skin and subcutaneous tissue of left arm: Secondary | ICD-10-CM

## 2022-04-01 DIAGNOSIS — Z09 Encounter for follow-up examination after completed treatment for conditions other than malignant neoplasm: Secondary | ICD-10-CM

## 2022-04-01 DIAGNOSIS — D171 Benign lipomatous neoplasm of skin and subcutaneous tissue of trunk: Secondary | ICD-10-CM

## 2022-04-01 NOTE — Patient Instructions (Signed)

## 2022-04-01 NOTE — Progress Notes (Signed)
[facility] POST-OP OFFICE VISIT  04/01/2022  HPI: Kaitlyn Turner is a 36 y.o. 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 To the medial aspect, there appears to be superficial separation of just the skin edge, there is no depth tho this. The subcutaneous closure appears grossly intact. No erythema, no swelling. Certainly without any evidence of infection nor abscess. There is a slight amount of healing ecchymosis.   Assessment/Plan: This is a 36 y.o. 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 Okay to shower still   - Reviewed surgical pathology; Benign Lipoma  - She can follow up on as needed basis; She understands to call with questions/concerns. I did review signs and symptoms of infection with her.   -- Edison Simon, PA-C [facility] 04/01/2022, 2:40 PM M-F: 7am - 4pm

## 2022-04-08 ENCOUNTER — Encounter: Payer: Self-pay | Admitting: Surgery

## 2022-04-13 ENCOUNTER — Other Ambulatory Visit: Payer: Self-pay

## 2022-04-13 ENCOUNTER — Encounter: Payer: Self-pay | Admitting: Physician Assistant

## 2022-04-13 ENCOUNTER — Ambulatory Visit (INDEPENDENT_AMBULATORY_CARE_PROVIDER_SITE_OTHER): Payer: Medicaid Other | Admitting: Physician Assistant

## 2022-04-13 VITALS — BP 125/86 | HR 73 | Temp 99.1°F | Ht 66.0 in | Wt 173.0 lb

## 2022-04-13 DIAGNOSIS — Z09 Encounter for follow-up examination after completed treatment for conditions other than malignant neoplasm: Secondary | ICD-10-CM

## 2022-04-13 DIAGNOSIS — D1722 Benign lipomatous neoplasm of skin and subcutaneous tissue of left arm: Secondary | ICD-10-CM

## 2022-04-13 DIAGNOSIS — D171 Benign lipomatous neoplasm of skin and subcutaneous tissue of trunk: Secondary | ICD-10-CM

## 2022-04-13 NOTE — Patient Instructions (Addendum)
Continue to wash the area with soap and water. Place a small piece of gauze over the area and secure with a small piece of tape. Please see your appointment listed below.

## 2022-04-13 NOTE — Progress Notes (Signed)
[facility] POST-OP OFFICE VISIT  04/13/2022  HPI: Kaitlyn Turner is a 36 y.o. 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 This also seemed to open up more on the medial half No fever, chills Some superficial burning sensation; no distal extremity numbness or strength loss, no other complaints   Vital signs: BP 125/86   Pulse 73   Temp 99.1 F (37.3 C) (Oral)   Ht '5\' 6"'$  (1.676 m)   Wt 173 lb (78.5 kg)   SpO2 99%   BMI 27.92 kg/m    Physical Exam: Constitutional: Well appearing female, NAD Skin: The medial 1/2 of her left axillary incision seems to have dehisced superficially, there is no depth, removed all old dermabond, the entire wound bed is healthy granulation tissue, there is no drainage, no erythema. The remaining lateral 1.2 of the incision is well healed.   Assessment/Plan: This is a 36 y.o. 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

## 2022-04-16 DIAGNOSIS — Z419 Encounter for procedure for purposes other than remedying health state, unspecified: Secondary | ICD-10-CM | POA: Diagnosis not present

## 2022-04-27 ENCOUNTER — Encounter: Payer: Medicaid Other | Admitting: Physician Assistant

## 2022-04-27 NOTE — Progress Notes (Deleted)
      Established patient visit   Patient: Kaitlyn Turner   DOB: January 01, 1986   36 y.o. Female  MRN: [id number] Visit Date: 04/28/2022  Today's healthcare provider: [name], MD   No chief complaint on file.  Subjective    HPI  Depression: Patient complains of depression. She complains of {depression symptoms:1002}. Onset was approximately {numbers; 0-10:33138} {time units:11} ago, {clinical course - history:17::"unchanged"} since that time.  She denies current suicidal and homicidal plan or intent.   Family history significant for {fam hx:15335}.Possible organic causes contributing are: {possible organic causes:15339}.  Risk factors: {depression risk factors:1001} Previous treatment includes {anxiety treatments:15336} and {depression treatment:1010}. She complains of the following side effects from the treatment: {side effects:15372}.   Medications: Outpatient Medications Prior to Visit  Medication Sig   acetaminophen (TYLENOL) 500 MG tablet Take 2 tablets (1,000 mg total) by mouth every 6 (six) hours as needed for mild pain.   NEXPLANON 68 MG IMPL implant    Prenatal Vit-Fe Fumarate-FA (PRENATAL MULTIVITAMIN) TABS tablet Take 1 tablet by mouth daily at 12 noon.   No facility-administered medications prior to visit.    Review of Systems  {Labs  Heme  Chem  Endocrine  Serology  Results Review (optional):23779}   Objective    There were no vitals taken for this visit. {Show previous vital signs (optional):23777}  Physical Exam  ***  No results found for any visits on 04/28/22.  Assessment & Plan     ***  No follow-ups on file.      {provider attestation***:1}   [name], MD  [facility] [phone] (phone) [phone] (fax)  [place]

## 2022-04-28 ENCOUNTER — Ambulatory Visit: Payer: Medicaid Other | Admitting: Family Medicine

## 2022-04-29 ENCOUNTER — Encounter: Payer: Medicaid Other | Admitting: Physician Assistant

## 2022-05-04 ENCOUNTER — Encounter: Payer: Medicaid Other | Admitting: Physician Assistant

## 2022-05-17 DIAGNOSIS — Z419 Encounter for procedure for purposes other than remedying health state, unspecified: Secondary | ICD-10-CM | POA: Diagnosis not present

## 2022-05-19 ENCOUNTER — Encounter: Payer: Self-pay | Admitting: Surgery

## 2022-05-19 ENCOUNTER — Ambulatory Visit (INDEPENDENT_AMBULATORY_CARE_PROVIDER_SITE_OTHER): Payer: Medicaid Other | Admitting: Surgery

## 2022-05-19 VITALS — BP 123/74 | HR 80 | Temp 98.9°F | Ht 66.0 in | Wt 178.0 lb

## 2022-05-19 DIAGNOSIS — Z09 Encounter for follow-up examination after completed treatment for conditions other than malignant neoplasm: Secondary | ICD-10-CM

## 2022-05-19 DIAGNOSIS — D1722 Benign lipomatous neoplasm of skin and subcutaneous tissue of left arm: Secondary | ICD-10-CM

## 2022-05-19 NOTE — Patient Instructions (Signed)
If you have any concerns or questions, please feel free to call our office. Follow up as needed.   Lipoma Removal, Care After The following information offers guidance on how to care for yourself after your procedure. Your health care provider may also give you more specific instructions. If you have problems or questions, contact your health care provider. What can I expect after the procedure? After the procedure, it is common to have: Mild pain. Swelling. Bruising. Follow these instructions at home: Bathing  Do not take baths, swim, or use a hot tub until your health care provider approves. Ask your health care provider if you may take showers. You may only be allowed to take sponge baths. Keep your bandage (dressing) clean and dry until your health care provider says it can be removed. Incision care  Follow instructions from your health care provider about how to take care of your incision. Make sure you: Wash your hands with soap and water for at least 20 seconds before and after you change your dressing. If soap and water are not available, use hand sanitizer. Change your dressing as told by your health care provider. Leave stitches (sutures), skin glue, or adhesive strips in place. These skin closures may need to stay in place for 2 weeks or longer. If adhesive strip edges start to loosen and curl up, you may trim the loose edges. Do not remove adhesive strips completely unless your health care provider tells you to do that. Check your incision area every day for signs of infection. Check for: More redness, swelling, or pain. Fluid or blood. Warmth. Pus or a bad smell. Medicines Take over-the-counter and prescription medicines only as told by your health care provider. If you were prescribed an antibiotic medicine, use it as told by your health care provider. Do not stop using the antibiotic even if you start to feel better. General instructions  If you were given a sedative  during the procedure, it can affect you for several hours. Do not drive or operate machinery until your health care provider says that it is safe. Do not use any products that contain nicotine or tobacco before the procedure. These products include cigarettes, chewing tobacco, and vaping devices, such as e-cigarettes. These can delay healing after surgery. If you need help quitting, ask your health care provider. Return to your normal activities as told by your health care provider. Ask your health care provider what activities are safe for you. Keep all follow-up visits. This is important. Contact a health care provider if: You have more redness, swelling, or pain around your incision. You have fluid or blood coming from your incision. Your incision feels warm to the touch. You have pus or a bad smell coming from your incision. You have pain that does not get better with medicine. Get help right away if: You have chills or a fever. You have severe pain. Summary After the procedure, it is common to have mild pain, swelling, and bruising. Follow instructions from your health care provider about how to take care of your incision. Contact a health care provider if you have signs of infection such as more redness, swelling, or pain. This information is not intended to replace advice given to you by your health care provider. Make sure you discuss any questions you have with your health care provider. Document Revised: 05/22/2021 Document Reviewed: 05/22/2021 Elsevier Patient Education  2023 Elsevier Inc.  

## 2022-05-19 NOTE — Progress Notes (Signed)
05/19/2022  HPI: Kaitlyn Turner is a 37 y.o. female s/p excision of left axillary lipoma on 03/25/22.  Patietn presents for follow up.  She had a dehiscence of the proximal aspect of the incision and reports that she's been dealing with intermittent bleeding issues from that portion of the wound.  She would apply a gauze dressing over the wound, but then when removing the gauze to change the dressing, the wound would start bleeding.  She has changed to a BandAid that has a non-adherent gauze on it and she reports the healing is improving and the bleeding is decreasing.  Vital signs: BP 123/74   Pulse 80   Temp 98.9 F (37.2 C) (Oral)   Ht '5\' 6"'$  (1.676 m)   Wt 178 lb (80.7 kg)   SpO2 99%   BMI 28.73 kg/m    Physical Exam: Constitutional:  No acute distress Skin:  Left axillary incision is well healed over the distal 2/3 of the incision, and had opened in the medial 1/3.  That area is now very shallow, with skin not fully covering the wound just yet.  There is one corner where there is sign of prior oozing.  This proximal aspect was treated with silver nitrate, and new BandAid applied.  Assessment/Plan: This is a 37 y.o. female s/p excision of left axillary lipoma.  --Discussed with the patient that unfortunately the dry gauze was adhering to the open wound and each time the gauze was removed, it peels some of the superficial wound layer, which would cause the bleeding.  Now that she has changed to a different dressing, the wound is healing better.  Discussed with her that if she uses regular gauze, she should get it wet prior to removing it so that it does not stick to the wound as much.  Or using a non-adherent gauze/BandAid is also helpful.   --Applied silver nitrate to the wound to help it scar more easily.   --Follow up as needed, particularly if this has not healed over the next two weeks.   Melvyn Neth, [facility]

## 2022-05-26 NOTE — Progress Notes (Deleted)
     I,[name],acting as a scribe for [employer], MD.,have documented all relevant documentation on the behalf of [name], MD,as directed by  [name], MD while in the presence of [name], MD.   Established patient visit   Patient: Kaitlyn Turner   DOB: 10-Mar-1986   37 y.o. Female  MRN: [id number] Visit Date: 05/27/2022  Today's healthcare provider: [name], MD   No chief complaint on file.  Subjective    HPI  Toe Nail Fungus  Patient was recommended to try topical solutions for toenail changes  Today she reports ***     Depression:  Patient complains of depression. She complains of {depression symptoms:1002}. Onset was approximately {numbers; 0-10:33138} {time units:11} ago, {clinical course - history:17::"unchanged"} since that time.  She denies current suicidal and homicidal plan or intent.   Family history significant for {fam hx:15335}.Possible organic causes contributing are: {possible organic causes:15339}.  Risk factors: {depression risk factors:1001} Previous treatment includes {anxiety treatments:15336} and {depression treatment:1010}. She complains of the following side effects from the treatment: {side effects:15372}.     11/03/2020    4:02 PM  Depression screen PHQ 2/9  Decreased Interest 0  Down, Depressed, Hopeless 0  PHQ - 2 Score 0      Medications: Outpatient Medications Prior to Visit  Medication Sig   acetaminophen (TYLENOL) 500 MG tablet Take 2 tablets (1,000 mg total) by mouth every 6 (six) hours as needed for mild pain.   NEXPLANON 68 MG IMPL implant    Prenatal Vit-Fe Fumarate-FA (PRENATAL MULTIVITAMIN) TABS tablet Take 1 tablet by mouth daily at 12 noon.   No facility-administered medications prior to visit.    Review of Systems  {Labs  Heme  Chem  Endocrine  Serology  Results Review (optional):23779}   Objective    There were no vitals taken for this  visit. {Show previous vital signs (optional):23777}  Physical Exam  ***  No results found for any visits on 05/27/22.  Assessment & Plan     Problem List Items Addressed This Visit   None    No follow-ups on file.      I, [name], MD, have reviewed all documentation for this visit.  Portions of this information were initially documented by the CMA and reviewed by me for thoroughness and accuracy.      [name], MD  [facility] [phone] (phone) [phone] (fax)  [place]

## 2022-05-27 ENCOUNTER — Ambulatory Visit: Payer: Medicaid Other | Admitting: Family Medicine

## 2022-05-30 NOTE — Progress Notes (Signed)
I,[name],acting as a scribe for [employer], MD.,have documented all relevant documentation on the behalf of [name], MD,as directed by  [name], MD while in the presence of [name], MD.   Established patient visit   Patient: Kaitlyn Turner   DOB: Apr 11, 1986   37 y.o. Female  MRN: [id number] Visit Date: 05/31/2022  Today's healthcare provider: [name], MD   Chief Complaint  Patient presents with   Follow-up   Depression   Subjective    HPI  Toe Nail Fungus  Patient was recommended to try topical solutions for toenail changes  Today she reports is a little better. Reports that she was not able to pick up the prescription.    Patient continues to breast-feed   Stress incontinence Patient states that she completed pelvic floor therapy after delivery of her daughter who is almost 14 months old She states that she has an abnormal sensation that she is not "completely healed" from prior physical therapy on her core She is attempting to return to the gym for physical activity and does not feel that her core is strong She would like to return to physical therapy for pelvic floor strengthening   Depression:  Patient reports that her arm was hurting a lot and she wasn't able to do a lot of things. She feels like her back is getting back on track after arm healed. Reports today that she is doing good and doesn't feel like she is depressed.     05/31/2022    3:09 PM  Depression screen PHQ 2/9  Decreased Interest 0  Down, Depressed, Hopeless 1  PHQ - 2 Score 1  Altered sleeping 0  Tired, decreased energy 1  Change in appetite 0  Feeling bad or failure about yourself  0  Trouble concentrating 0  Moving slowly or fidgety/restless 0  Suicidal thoughts 0  PHQ-9 Score 2  Difficult doing work/chores Not difficult at all      Medications: Outpatient Medications Prior to Visit  Medication  Sig   acetaminophen (TYLENOL) 500 MG tablet Take 2 tablets (1,000 mg total) by mouth every 6 (six) hours as needed for mild pain.   NEXPLANON 68 MG IMPL implant    Prenatal Vit-Fe Fumarate-FA (PRENATAL MULTIVITAMIN) TABS tablet Take 1 tablet by mouth daily at 12 noon.   No facility-administered medications prior to visit.    Review of Systems     Objective    BP 122/87 (BP Location: Right Arm, Patient Position: Sitting, Cuff Size: Normal)   Pulse 79   Temp (!) 97.4 F (36.3 C) (Oral)   Resp 16   Wt 182 lb 3.2 oz (82.6 kg)   BMI 29.41 kg/m    Physical Exam Vitals reviewed.  Constitutional:      General: She is not in acute distress.    Appearance: Normal appearance. She is not ill-appearing, toxic-appearing or diaphoretic.  Eyes:     Conjunctiva/sclera: Conjunctivae normal.  Cardiovascular:     Rate and Rhythm: Normal rate and regular rhythm.     Pulses: Normal pulses.     Heart sounds: Normal heart sounds. No murmur heard.    No friction rub. No gallop.  Pulmonary:     Effort: Pulmonary effort is normal. No respiratory distress.     Breath sounds: Normal breath sounds. No stridor. No wheezing, rhonchi or rales.  Abdominal:     General: Bowel sounds are normal. There is no distension.  Palpations: Abdomen is soft. There is no shifting dullness, fluid wave, mass or pulsatile mass.     Tenderness: There is no abdominal tenderness. There is no right CVA tenderness or left CVA tenderness.  Musculoskeletal:     Right lower leg: No edema.     Left lower leg: No edema.  Skin:    Findings: No erythema or rash.  Neurological:     Mental Status: She is alert and oriented to person, place, and time.      No results found for any visits on 05/31/22.  Assessment & Plan     Problem List Items Addressed This Visit       Musculoskeletal and Integument   Toenail fungus    No changes  Will submit prescription for topical via printed script  Recommended that patient  contact different pharmacies to see who has the prescription in stock and fill script from there  Decided against oral agent for fungal toenail changes due to continued breast feeding        Relevant Medications   Efinaconazole 10 % SOLN     Other   SUI (stress urinary incontinence, female) - Primary    Patient reports return of urinary leakage with increased intraabdominal pressure (laughter or sneezing)  She requests to return to pelvic floor PT, prefers same location locally that she had when she was postpartum        Relevant Orders   Ambulatory referral to Physical Therapy       I, [name], MD, have reviewed all documentation for this visit.  Portions of this information were initially documented by the CMA and reviewed by me for thoroughness and accuracy.      [name], MD  [facility] [phone] (phone) [phone] (fax)  [place]

## 2022-05-31 ENCOUNTER — Ambulatory Visit (INDEPENDENT_AMBULATORY_CARE_PROVIDER_SITE_OTHER): Payer: Medicaid Other | Admitting: Family Medicine

## 2022-05-31 ENCOUNTER — Encounter: Payer: Self-pay | Admitting: Family Medicine

## 2022-05-31 VITALS — BP 122/87 | HR 79 | Temp 97.4°F | Resp 16 | Wt 182.2 lb

## 2022-05-31 DIAGNOSIS — N393 Stress incontinence (female) (male): Secondary | ICD-10-CM

## 2022-05-31 DIAGNOSIS — B351 Tinea unguium: Secondary | ICD-10-CM

## 2022-05-31 MED ORDER — EFINACONAZOLE 10 % EX SOLN: 1.0000 "application " | Freq: Every day | CUTANEOUS | 2 refills | Status: DC

## 2022-06-02 NOTE — Assessment & Plan Note (Signed)
Patient reports return of urinary leakage with increased intraabdominal pressure (laughter or sneezing)  She requests to return to pelvic floor PT, prefers same location locally that she had when she was postpartum

## 2022-06-02 NOTE — Assessment & Plan Note (Signed)
No changes  Will submit prescription for topical via printed script  Recommended that patient contact different pharmacies to see who has the prescription in stock and fill script from there  Decided against oral agent for fungal toenail changes due to continued breast feeding

## 2022-06-17 DIAGNOSIS — Z419 Encounter for procedure for purposes other than remedying health state, unspecified: Secondary | ICD-10-CM | POA: Diagnosis not present

## 2022-06-17 HISTORY — PX: DENTAL RESTORATION/EXTRACTION WITH X-RAY: SHX5796

## 2022-07-08 ENCOUNTER — Ambulatory Visit: Payer: Medicaid Other | Admitting: Family Medicine

## 2022-07-16 DIAGNOSIS — Z419 Encounter for procedure for purposes other than remedying health state, unspecified: Secondary | ICD-10-CM | POA: Diagnosis not present

## 2022-07-29 ENCOUNTER — Ambulatory Visit
Admission: EM | Admit: 2022-07-29 | Discharge: 2022-07-29 | Disposition: A | Discharge status: Home / Self Care | Payer: Medicaid Other | Attending: Emergency Medicine | Admitting: Emergency Medicine

## 2022-07-29 ENCOUNTER — Other Ambulatory Visit: Payer: Self-pay

## 2022-07-29 DIAGNOSIS — J069 Acute upper respiratory infection, unspecified: Secondary | ICD-10-CM | POA: Diagnosis not present

## 2022-07-29 LAB — GROUP A STREP BY PCR: Group A Strep by PCR: NOT DETECTED

## 2022-07-29 MED ORDER — IPRATROPIUM BROMIDE 0.06 % NA SOLN: 2.0000 | Freq: Four times a day (QID) | NASAL | 12 refills | Status: DC

## 2022-07-29 MED ORDER — BENZONATATE 100 MG PO CAPS: 200.0000 mg | ORAL_CAPSULE | Freq: Three times a day (TID) | ORAL | 0 refills | Status: DC

## 2022-07-29 MED ORDER — AZITHROMYCIN 250 MG PO TABS: 250.0000 mg | ORAL_TABLET | Freq: Every day | ORAL | 0 refills | Status: DC

## 2022-07-29 MED ORDER — PROMETHAZINE-DM 6.25-15 MG/5ML PO SYRP: 5.0000 mL | ORAL_SOLUTION | Freq: Four times a day (QID) | ORAL | 0 refills | Status: DC | PRN

## 2022-07-29 NOTE — ED Provider Notes (Signed)
[facility]    CSN: [id number] Arrival date & time: 07/29/22  1344      History   Chief Complaint No chief complaint on file.   HPI Kaitlyn Turner is a 37 y.o. female.   HPI  37 year old female here for evaluation of respiratory complaints.  The patient has a past medical history significant for PE, hypotension, clotting disorder, depression, and anemia presenting for evaluation of 6 days worth of nasal congestion, sinus pressure with yellow nasal discharge, sore throat, cough that is productive for a white to green sputum.  She denies any fever, wheezing, or GI complaints.  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 was a toddler   Mental disorder    Panic attacks   Pulmonary embolism ([place]) 2013   was hospitalized for 2 weeks-saw hematologist but all testing was negative   Vaginal Pap smear, abnormal     Patient Active Problem List   Diagnosis Date Noted   SUI (stress urinary incontinence, female) 05/31/2022   Lipoma of left axilla 03/25/2022   Toenail fungus 03/11/2022   Syncope, vasovagal 02/18/2022   Subcutaneous nodule of left upper extremity 02/18/2022   NSVD (normal spontaneous vaginal delivery) 07/01/2021   Postpartum hemorrhage, third stage, delivered 07/01/2021   Personal history of PE (pulmonary embolism) 07/01/2021   History of malignant hyperthermia 06/17/2021   Nausea & vomiting 04/23/2021   Low grade squamous intraepithelial lesion on cytologic smear of cervix (LGSIL) 12/23/2020   Overweight BMI=28.8 01/30/2020   H/O alcohol abuse--DUI 2019 01/30/2020   History of pulmonary embolism  01/30/2020   Chest pain 01/06/2019   MDD (major depressive disorder), recurrent episode ([place]) 08/21/2018   Suicidal ideation 08/20/2018   Family disruption due to divorce or legal separation 06/03/2015   Adjustment disorder with mixed anxiety and depressed mood 06/03/2015   Family history of alcoholism 01/08/2015    Past Surgical History:  Procedure Laterality Date   DENTAL SURGERY     as a toddler   LIPOMA EXCISION Left 03/25/2022   Procedure: EXCISION LIPOMA, left axillary;  Surgeon: Olean Ree, MD;  Location: [facility] ORS;  Service: General;  Laterality: Left;    OB History     Gravida  9   Para  4   Term  4   Preterm  0   AB  5   Living  4      SAB  3   IAB  2   Ectopic  0   Multiple  0   Live Births  4            Home Medications    Prior to Admission medications   Medication Sig Start Date End Date Taking? Authorizing Provider  azithromycin (ZITHROMAX Z-PAK) 250 MG tablet Take 1 tablet (250 mg total) by mouth daily. Take 2 tablets on the first day and then 1 tablet daily thereafter for a total of 5 days of treatment. 07/29/22  Yes Margarette Canada, NP  benzonatate (TESSALON) 100 MG capsule Take 2 capsules (200 mg total) by mouth every  8 (eight) hours. 07/29/22  Yes Margarette Canada, NP  ipratropium (ATROVENT) 0.06 % nasal spray Place 2 sprays into both nostrils 4 (four) times daily. 07/29/22  Yes Margarette Canada, NP  promethazine-dextromethorphan (PROMETHAZINE-DM) 6.25-15 MG/5ML syrup Take 5 mLs by mouth 4 (four) times daily as needed. 07/29/22  Yes Margarette Canada, NP  acetaminophen (TYLENOL) 500 MG tablet Take 2 tablets (1,000 mg total) by mouth every 6 (six) hours as needed for mild pain. 03/25/22   Piscoya, Jacqulyn Bath, MD  Efinaconazole 10 % SOLN Apply 1 application  topically daily. 05/31/22   Simmons-Robinson, Riki Sheer, MD  NEXPLANON 68 MG IMPL implant  10/01/21   [provider]  Prenatal Vit-Fe Fumarate-FA (PRENATAL MULTIVITAMIN) TABS tablet Take 1 tablet by mouth  daily at 12 noon. [id number]   Avelino Leeds Lucy Lorena, CNM  COLCRYS 0.6 MG tablet Take 1 tablet (0.6 mg total) by mouth daily. Patient not taking: Reported on 01/30/2020 06/26/19 05/30/20  Kate Sable, MD  pantoprazole (PROTONIX) 40 MG tablet Take 1 tablet (40 mg total) by mouth daily. For 1 month. Patient not taking: Reported on 01/30/2020 06/21/19 05/30/20  Kate Sable, MD  traZODone (DESYREL) 50 MG tablet Take 1 tablet (50 mg total) by mouth at bedtime and may repeat dose one time if needed. For sleep 08/23/18 01/06/19  Connye Burkitt, NP    Family History Family History  Problem Relation Age of Onset   Cancer Mother        Unsure   Hypertension Father    Hypertension Maternal Grandmother    Heart disease Maternal Grandmother     Social History Social History   Tobacco Use   Smoking status: Never   Smokeless tobacco: Never  Vaping Use   Vaping Use: Never used  Substance Use Topics   Alcohol use: Not Currently   Drug use: No     Allergies   Stadol [butorphanol], Keflex [cephalexin], Other, Pork-derived products, and Toradol [ketorolac tromethamine]   Review of Systems Review of Systems  Constitutional:  Negative for fever.  HENT:  Positive for congestion, ear pain, rhinorrhea, sinus pressure and sore throat.   Respiratory:  Positive for cough and chest tightness. Negative for shortness of breath and wheezing.   Gastrointestinal:  Negative for diarrhea, nausea and vomiting.     Physical Exam Triage Vital Signs ED Triage Vitals  Enc Vitals Group     BP 07/29/22 1400 (!) 126/90     Pulse Rate 07/29/22 1400 77     Resp 07/29/22 1400 20     Temp 07/29/22 1400 98.3 F (36.8 C)     Temp src --      SpO2 07/29/22 1400 100 %     Weight --      Height --      Head Circumference --      Peak Flow --      Pain Score 07/29/22 1359 7     Pain Loc --      Pain Edu? --      Excl. in [place]? --    No data found.  Updated Vital Signs BP (!) 126/90   Pulse 77    Temp 98.3 F (36.8 C)   Resp 20   SpO2 100%   Visual Acuity Right Eye Distance:   Left Eye Distance:   Bilateral Distance:    Right Eye Near:   Left Eye Near:    Bilateral Near:     Physical Exam Vitals and nursing note reviewed.  Constitutional:      Appearance: Normal appearance. She is not ill-appearing.  HENT:     Head: Normocephalic and atraumatic.     Right Ear: Tympanic membrane, ear canal and external ear normal. There is no impacted cerumen.     Left Ear: Tympanic membrane, ear canal and external ear normal. There is no impacted cerumen.     Nose: Congestion and rhinorrhea present.     Mouth/Throat:     Mouth: Mucous membranes are moist.     Pharynx: Oropharynx is clear. Posterior oropharyngeal erythema present. No oropharyngeal exudate.     Comments: Tonsillar pillars are benign.  Posterior pharynx demonstrates erythema with yellow postnasal drip. Cardiovascular:     Rate and Rhythm: Normal rate and regular rhythm.     Pulses: Normal pulses.     Heart sounds: Normal heart sounds. No murmur heard.    No friction rub. No gallop.  Pulmonary:     Effort: Pulmonary effort is normal.     Breath sounds: Normal breath sounds. No wheezing, rhonchi or rales.  Musculoskeletal:     Cervical back: Normal range of motion and neck supple.  Skin:    General: Skin is warm and dry.     Capillary Refill: Capillary refill takes less than 2 seconds.     Findings: No erythema or rash.  Neurological:     General: No focal deficit present.     Mental Status: She is alert and oriented to person, place, and time.      UC Treatments / Results  Labs (all labs ordered are listed, but only abnormal results are displayed) Labs Reviewed  GROUP A STREP BY PCR    EKG   Radiology No results found.  Procedures Procedures (including critical care time)  Medications Ordered in UC Medications - No data to display  Initial Impression / Assessment and Plan / UC Course  I have  reviewed the triage vital signs and the nursing notes.  Pertinent labs & imaging results that were available during my care of the patient were reviewed by me and considered in my medical decision making (see chart for details).   Patient is a nontoxic-appearing 37 year old female presenting for evaluation of 6 days worth of respiratory symptoms as outlined in HPI above.  The patient is not in any acute distress and she is able to speak in full sentence without dyspnea or tachypnea.  She is afebrile here in clinic with a oral temp of 98.3.  She does have inflamed nasal mucosa with yellow drainage in both nares as well as yellow postnasal drip.  Her lungs are clear to auscultation all fields.  There is no evidence of tonsillar edema or exudate on exam.  Strep PCR was collected at triage and is pending.  I am going to treat the patient for an upper respiratory infection with azithromycin, Atrovent nasal spray, Tessalon Perles, and Promethazine DM cough syrup as she has an allergy to Keflex.  It is unclear if she has ever had penicillin before.   Final Clinical Impressions(s) / UC Diagnoses   Final diagnoses:  Acute URI     Discharge Instructions      Your exam is consistent with an upper respiratory infection.  Please take the azithromycin according to package instructions for treatment of your URI.  Use over-the-counter Tylenol and/or ibuprofen according to package instructions as needed for pain.  Use the Atrovent nasal spray, 2 squirts in each nostril every 6 hours, as needed for runny  nose and postnasal drip.  Use the Tessalon Perles every 8 hours during the day.  Take them with a small sip of water.  They may give you some numbness to the base of your tongue or a metallic taste in your mouth, this is normal.  Use the Promethazine DM cough syrup at bedtime for cough and congestion.  It will make you drowsy so do not take it during the day.  Return for reevaluation or see your primary  care provider for any new or worsening symptoms.      ED Prescriptions     Medication Sig Dispense Auth. Provider   azithromycin (ZITHROMAX Z-PAK) 250 MG tablet Take 1 tablet (250 mg total) by mouth daily. Take 2 tablets on the first day and then 1 tablet daily thereafter for a total of 5 days of treatment. 6 tablet Margarette Canada, NP   benzonatate (TESSALON) 100 MG capsule Take 2 capsules (200 mg total) by mouth every 8 (eight) hours. 21 capsule Margarette Canada, NP   ipratropium (ATROVENT) 0.06 % nasal spray Place 2 sprays into both nostrils 4 (four) times daily. 15 mL Margarette Canada, NP   promethazine-dextromethorphan (PROMETHAZINE-DM) 6.25-15 MG/5ML syrup Take 5 mLs by mouth 4 (four) times daily as needed. 118 mL Margarette Canada, NP      PDMP not reviewed this encounter.   Margarette Canada, NP 07/29/22 1432

## 2022-07-29 NOTE — ED Triage Notes (Signed)
Pt c/o Cough and diff breathing with facial pain and pressure and sore throat and symptoms initially started Friday night and pt woke up Saturday with congestion runny nose and cough. Denies fever and no relief from mucinex, vicks or theraflu

## 2022-07-29 NOTE — Discharge Instructions (Addendum)
Your exam is consistent with an upper respiratory infection.  Please take the azithromycin according to package instructions for treatment of your URI.  Use over-the-counter Tylenol and/or ibuprofen according to package instructions as needed for pain.  Use the Atrovent nasal spray, 2 squirts in each nostril every 6 hours, as needed for runny nose and postnasal drip.  Use the Tessalon Perles every 8 hours during the day.  Take them with a small sip of water.  They may give you some numbness to the base of your tongue or a metallic taste in your mouth, this is normal.  Use the Promethazine DM cough syrup at bedtime for cough and congestion.  It will make you drowsy so do not take it during the day.  Return for reevaluation or see your primary care provider for any new or worsening symptoms.

## 2022-08-16 DIAGNOSIS — Z419 Encounter for procedure for purposes other than remedying health state, unspecified: Secondary | ICD-10-CM | POA: Diagnosis not present

## 2022-08-28 ENCOUNTER — Ambulatory Visit
Admission: EM | Admit: 2022-08-28 | Discharge: 2022-08-28 | Disposition: A | Discharge status: Home / Self Care | Payer: Medicaid Other | Attending: Physician Assistant | Admitting: Physician Assistant

## 2022-08-28 DIAGNOSIS — J020 Streptococcal pharyngitis: Secondary | ICD-10-CM | POA: Insufficient documentation

## 2022-08-28 LAB — GROUP A STREP BY PCR: Group A Strep by PCR: DETECTED — AB

## 2022-08-28 MED ORDER — AMOXICILLIN 500 MG PO CAPS: 500.0000 mg | ORAL_CAPSULE | Freq: Two times a day (BID) | ORAL | 0 refills | Status: AC

## 2022-08-28 MED ORDER — LIDOCAINE VISCOUS HCL 2 % MT SOLN: 15.0000 mL | OROMUCOSAL | 0 refills | Status: DC | PRN

## 2022-08-28 NOTE — ED Provider Notes (Signed)
[facility]    CSN: [id number] Arrival date & time: 08/28/22  1305      History   Chief Complaint Chief Complaint  Patient presents with   Sore Throat    HPI PRIMROSE OLER is a 37 y.o. female presenting for sore throat painful swallowing for the past day.  Chills reports fatigue and headaches.  Denies cough, congestion, breathing difficulty or wheezing, vomiting or diarrhea.  Reports she believes she might have strep throat.  Denies any sick contacts.  Taking OTC meds.  No other complaints.  HPI  Past Medical History:  Diagnosis Date   Adopted    Anemia    Clotting disorder    Depression    Family history of adverse reaction to anesthesia    pt is adopted and is unsure of her biological moms history   Family history of alcoholism 01/08/2015   Per [employer]   History of suicidal ideation    Hypotension    Malignant hyperthermia    during [place] with dental procedure when she was a toddler   Mental disorder    Panic attacks   Pulmonary embolism 2013   was hospitalized for 2 weeks-saw hematologist but all testing was negative   Vaginal Pap smear, abnormal     Patient Active Problem List   Diagnosis Date Noted   SUI (stress urinary incontinence, female) 05/31/2022   Lipoma of left axilla 03/25/2022   Toenail fungus 03/11/2022   Syncope, vasovagal 02/18/2022   Subcutaneous nodule of left upper extremity 02/18/2022   NSVD (normal spontaneous vaginal delivery) 07/01/2021   Postpartum hemorrhage, third stage, delivered 07/01/2021   Personal history of PE (pulmonary embolism) 07/01/2021   History of malignant hyperthermia 06/17/2021   Nausea & vomiting 04/23/2021   Low grade squamous intraepithelial lesion on cytologic smear of cervix (LGSIL) 12/23/2020   Overweight BMI=28.8 01/30/2020   H/O alcohol abuse--DUI 2019 01/30/2020   History of pulmonary embolism 01/30/2020   Chest pain 01/06/2019   MDD (major depressive disorder), recurrent episode 08/21/2018    Suicidal ideation 08/20/2018   Family disruption due to divorce or legal separation 06/03/2015   Adjustment disorder with mixed anxiety and depressed mood 06/03/2015   Family history of alcoholism 01/08/2015    Past Surgical History:  Procedure Laterality Date   DENTAL RESTORATION/EXTRACTION WITH X-RAY  06/17/2022   Tooth Number 1 on right side   DENTAL SURGERY     as a toddler   LIPOMA EXCISION Left 03/25/2022   Procedure: EXCISION LIPOMA, left axillary;  Surgeon: Henrene Dodge, MD;  Location: [facility] ORS;  Service: General;  Laterality: Left;    OB History     Gravida  9   Para  4   Term  4   Preterm  0   AB  5   Living  4      SAB  3   IAB  2   Ectopic  0   Multiple  0   Live Births  4            Home Medications    Prior to Admission medications   Medication Sig Start Date End Date Taking? Authorizing Provider  acetaminophen (TYLENOL) 500 MG tablet Take 2 tablets (1,000 mg total) by mouth every 6 (six) hours as needed for mild pain. 03/25/22  Yes Piscoya, Elita Quick, MD  amoxicillin (AMOXIL) 500 MG capsule Take 1 capsule (500 mg total) by mouth 2 (two) times daily for 10 days. 08/28/22 09/07/22 Yes Michiel Cowboy,  Cathlean Cower B, PA-C  ipratropium (ATROVENT) 0.06 % nasal spray Place 2 sprays into both nostrils 4 (four) times daily. 07/29/22  Yes Becky Augusta, NP  lidocaine (XYLOCAINE) 2 % solution Use as directed 15 mLs in the mouth or throat every 3 (three) hours as needed for mouth pain (swish and spit). 08/28/22  Yes Shirlee Latch, PA-C  NEXPLANON 68 MG IMPL implant  10/01/21  Yes [provider]  benzonatate (TESSALON) 100 MG capsule Take 2 capsules (200 mg total) by mouth every 8 (eight) hours. 07/29/22   Becky Augusta, NP  Efinaconazole 10 % SOLN Apply 1 application  topically daily. 05/31/22   Simmons-Robinson, Tawanna Cooler, MD  Prenatal Vit-Fe Fumarate-FA (PRENATAL MULTIVITAMIN) TABS tablet Take 1 tablet by mouth daily at 12 noon. 07/02/21   Sonny Dandy, CNM  promethazine-dextromethorphan (PROMETHAZINE-DM) 6.25-15 MG/5ML syrup Take 5 mLs by mouth 4 (four) times daily as needed. 07/29/22   Becky Augusta, NP  COLCRYS 0.6 MG tablet Take 1 tablet (0.6 mg total) by mouth daily. Patient not taking: Reported on 01/30/2020 06/26/19 05/30/20  Debbe Odea, MD  pantoprazole (PROTONIX) 40 MG tablet Take 1 tablet (40 mg total) by mouth daily. For 1 month. Patient not taking: Reported on 01/30/2020 06/21/19 05/30/20  Debbe Odea, MD  traZODone (DESYREL) 50 MG tablet Take 1 tablet (50 mg total) by mouth at bedtime and may repeat dose one time if needed. For sleep 08/23/18 01/06/19  Aldean Baker, NP    Family History Family History  Problem Relation Age of Onset   Cancer Mother        Unsure   Hypertension Father    Hypertension Maternal Grandmother    Heart disease Maternal Grandmother     Social History Social History   Tobacco Use   Smoking status: Never   Smokeless tobacco: Never  Vaping Use   Vaping Use: Never used  Substance Use Topics   Alcohol use: Not Currently   Drug use: No     Allergies   Stadol [butorphanol], Keflex [cephalexin], Other, Pork-derived products, and Toradol [ketorolac tromethamine]   Review of Systems Review of Systems  Constitutional:  Negative for chills, diaphoresis, fatigue and fever.  HENT:  Positive for sore throat. Negative for congestion, ear pain, rhinorrhea, sinus pressure and sinus pain.   Respiratory:  Negative for cough and shortness of breath.   Gastrointestinal:  Negative for abdominal pain, nausea and vomiting.  Musculoskeletal:  Negative for arthralgias and myalgias.  Skin:  Negative for rash.  Neurological:  Positive for headaches. Negative for weakness.  Hematological:  Positive for adenopathy.     Physical Exam Triage Vital Signs ED Triage Vitals  Enc Vitals Group     BP 08/28/22 1348 125/73     Pulse Rate 08/28/22 1348 89     Resp --      Temp 08/28/22 1348 98.8 F  (37.1 C)     Temp Source 08/28/22 1348 Oral     SpO2 08/28/22 1348 99 %     Weight 08/28/22 1345 180 lb (81.6 kg)     Height 08/28/22 1345 5\' 6"  (1.676 m)     Head Circumference --      Peak Flow --      Pain Score 08/28/22 1344 4     Pain Loc --      Pain Edu? --      Excl. in [place]? --    No data found.  Updated Vital Signs BP 125/73 (BP  Location: Left Arm)   Pulse 89   Temp 98.8 F (37.1 C) (Oral)   Ht  (1.676 m)   Wt 180 lb (81.6 kg)   SpO2 99%   BMI 29.05 kg/m     Physical Exam Vitals and nursing note reviewed.  Constitutional:      General: She is not in acute distress.    Appearance: Normal appearance. She is not ill-appearing or toxic-appearing.  HENT:     Head: Normocephalic and atraumatic.     Nose: Nose normal.     Mouth/Throat:     Mouth: Mucous membranes are moist.     Pharynx: Oropharynx is clear. Posterior oropharyngeal erythema present.  Eyes:     General: No scleral icterus.       Right eye: No discharge.        Left eye: No discharge.     Conjunctiva/sclera: Conjunctivae normal.  Cardiovascular:     Rate and Rhythm: Normal rate and regular rhythm.     Heart sounds: Normal heart sounds.  Pulmonary:     Effort: Pulmonary effort is normal. No respiratory distress.     Breath sounds: Normal breath sounds.  Musculoskeletal:     Cervical back: Neck supple.  Skin:    General: Skin is dry.  Neurological:     General: No focal deficit present.     Mental Status: She is alert. Mental status is at baseline.     Motor: No weakness.     Gait: Gait normal.  Psychiatric:        Mood and Affect: Mood normal.        Behavior: Behavior normal.        Thought Content: Thought content normal.      UC Treatments / Results  Labs (all labs ordered are listed, but only abnormal results are displayed) Labs Reviewed  GROUP A STREP BY PCR - Abnormal; Notable for the following components:      Result Value   Group A Strep by PCR DETECTED (*)    All  other components within normal limits    EKG   Radiology No results found.  Procedures Procedures (including critical care time)  Medications Ordered in UC Medications - No data to display  Initial Impression / Assessment and Plan / UC Course  I have reviewed the triage vital signs and the nursing notes.  Pertinent labs & imaging results that were available during my care of the patient were reviewed by me and considered in my medical decision making (see chart for details).   37 year old female presents for sore throat and painful swallowing for the past couple of days.  No known fever.  PCR strep positive.  Will treat with amoxicillin and viscous lidocaine.  She has a history of hives reaction with Keflex but says she can take amoxicillin.  Supportive care encouraged.  Reviewed return and ER precautions.   Final Clinical Impressions(s) / UC Diagnoses   Final diagnoses:  Strep pharyngitis   Discharge Instructions   None    ED Prescriptions     Medication Sig Dispense Auth. Provider   amoxicillin (AMOXIL) 500 MG capsule Take 1 capsule (500 mg total) by mouth 2 (two) times daily for 10 days. 20 capsule Eusebio Friendly B, PA-C   lidocaine (XYLOCAINE) 2 % solution Use as directed 15 mLs in the mouth or throat every 3 (three) hours as needed for mouth pain (swish and spit). 100 mL Shirlee Latch, PA-C  PDMP not reviewed this encounter.   Shirlee Latch, PA-C 08/28/22 1434

## 2022-08-28 NOTE — ED Triage Notes (Signed)
Pt c/o sore throat x1day.  Pt is worried about strep throat. Pt is having pain when swallowing and has pain on the left side of her throat.

## 2022-08-30 ENCOUNTER — Other Ambulatory Visit: Payer: Self-pay | Admitting: Family Medicine

## 2022-08-30 ENCOUNTER — Telehealth: Payer: Self-pay | Admitting: Family Medicine

## 2022-08-30 DIAGNOSIS — B351 Tinea unguium: Secondary | ICD-10-CM

## 2022-08-30 MED ORDER — EFINACONAZOLE 10 % EX SOLN: 1.0000 "application " | Freq: Every day | CUTANEOUS | 2 refills | Status: DC

## 2022-08-30 NOTE — Telephone Encounter (Addendum)
Medication Refill - Medication: Efinaconazole 10 % SOLN   Pt stated back on 05/31/2022 that Dr. Neita Garnet gave her a paper copy of Rx for the medication Efinaconazole 10 % SOLN for nail fungus on both feet. Pt stated she lost a paper copy of Rx and is requesting it be resent to the pharmacy for her to pick up. Stated she was not able to pick it up in the past as the pharmacy did not have it in stock.    Has the patient contacted their pharmacy? Yes.    (Agent: If yes, when and what did the pharmacy advise?)  Preferred Pharmacy (with phone number or street name):  [id number] [place], [place] - [address]  [address] [place] [address]  Phone: [phone] Fax: [phone]  Hours: Not open 24 hours   Has the patient been seen for an appointment in the last year OR does the patient have an upcoming appointment? Yes.    Agent: Please be advised that RX refills may take up to 3 business days. We ask that you follow-up with your pharmacy.

## 2022-08-31 ENCOUNTER — Telehealth: Payer: Self-pay | Admitting: Family Medicine

## 2022-08-31 ENCOUNTER — Other Ambulatory Visit: Payer: Self-pay | Admitting: Family Medicine

## 2022-08-31 DIAGNOSIS — B351 Tinea unguium: Secondary | ICD-10-CM

## 2022-08-31 DIAGNOSIS — B49 Unspecified mycosis: Secondary | ICD-10-CM

## 2022-08-31 MED ORDER — CICLOPIROX OLAMINE 0.77 % EX SUSP: CUTANEOUS | 1 refills | Status: DC

## 2022-08-31 NOTE — Addendum Note (Signed)
Addended by: Bing Neighbors on: 08/31/2022 12:11 PM   Modules accepted: Orders

## 2022-08-31 NOTE — Telephone Encounter (Signed)
Patient advised via mychart message by provider

## 2022-08-31 NOTE — Telephone Encounter (Signed)
Please inform the patient that I was unable to fill the initially requested prescription and was recommended to prescribe the replacement listed below  Called in prescription for ciclopirox solution to apply topically, this medication is not recommended to be used while breast feeding.

## 2022-08-31 NOTE — Telephone Encounter (Signed)
Will refer to podiatry for specialist evaluation and treatment recommendation for lactating patient    Please update patient

## 2022-09-02 ENCOUNTER — Telehealth: Payer: Self-pay | Admitting: Family Medicine

## 2022-09-15 DIAGNOSIS — Z419 Encounter for procedure for purposes other than remedying health state, unspecified: Secondary | ICD-10-CM | POA: Diagnosis not present

## 2022-09-28 ENCOUNTER — Ambulatory Visit: Payer: Medicaid Other | Admitting: Podiatry

## 2022-09-28 DIAGNOSIS — L853 Xerosis cutis: Secondary | ICD-10-CM

## 2022-09-28 MED ORDER — AMMONIUM LACTATE 12 % EX LOTN: 1.0000 | TOPICAL_LOTION | CUTANEOUS | 0 refills | Status: DC | PRN

## 2022-09-28 NOTE — Progress Notes (Signed)
Subjective:  Patient ID: Kaitlyn Turner, female    DOB: 12/10/1985,  MRN: [id number]  Chief Complaint  Patient presents with   Nail Problem    Nail fungus     37 y.o. female presents with the above complaint.  Patient presents with complaint bilateral dry foot.  Patient states been present for quite some time.  She also has nail fungus but she is currently breast-feeding and is unable to undergo treatment.  She would like to discuss treatment options for dry skin.  She is tried over-the-counter options none of which has helped.  There is causing some cracking.  Denies any other acute complaints   Review of Systems: Negative except as noted in the HPI. Denies N/V/F/Ch.  Past Medical History:  Diagnosis Date   Adopted    Anemia    Clotting disorder (HCC)    Depression    Family history of adverse reaction to anesthesia    pt is adopted and is unsure of her biological moms history   Family history of alcoholism 01/08/2015   Per [employer]   History of suicidal ideation    Hypotension    Malignant hyperthermia    during [place] with dental procedure when she was a toddler   Mental disorder    Panic attacks   Pulmonary embolism (HCC) 2013   was hospitalized for 2 weeks-saw hematologist but all testing was negative   Vaginal Pap smear, abnormal     Current Outpatient Medications:    ammonium lactate (AMLACTIN DAILY) 12 % lotion, Apply 1 Application topically as needed for dry skin., Disp: 400 g, Rfl: 0   chlorhexidine (PERIDEX) 0.12 % solution, SMARTSIG:By Mouth, Disp: , Rfl:    HYDROcodone-acetaminophen (NORCO/VICODIN) 5-325 MG tablet, Take 1 tablet by mouth every 6 (six) hours as needed., Disp: , Rfl:    ondansetron (ZOFRAN-ODT) 4 MG disintegrating tablet, Take by mouth., Disp: , Rfl:    acetaminophen (TYLENOL) 500 MG tablet, Take 2 tablets (1,000 mg total) by mouth every 6 (six) hours as needed for mild pain., Disp: , Rfl:    benzonatate (TESSALON) 100 MG capsule, Take 2 capsules  (200 mg total) by mouth every 8 (eight) hours., Disp: 21 capsule, Rfl: 0   ciclopirox (LOPROX) 0.77 % SUSP, Apply to affected areas twice daily, Disp: 60 mL, Rfl: 1   Efinaconazole 10 % SOLN, Apply 1 application  topically daily., Disp: 8 mL, Rfl: 2   ipratropium (ATROVENT) 0.06 % nasal spray, Place 2 sprays into both nostrils 4 (four) times daily., Disp: 15 mL, Rfl: 12   lidocaine (XYLOCAINE) 2 % solution, Use as directed 15 mLs in the mouth or throat every 3 (three) hours as needed for mouth pain (swish and spit)., Disp: 100 mL, Rfl: 0   NEXPLANON 68 MG IMPL implant, , Disp: , Rfl:    Prenatal Vit-Fe Fumarate-FA (PRENATAL MULTIVITAMIN) TABS tablet, Take 1 tablet by mouth daily at 12 noon., Disp: , Rfl:    promethazine-dextromethorphan (PROMETHAZINE-DM) 6.25-15 MG/5ML syrup, Take 5 mLs by mouth 4 (four) times daily as needed., Disp: 118 mL, Rfl: 0  Social History   Tobacco Use  Smoking Status Never  Smokeless Tobacco Never    Allergies  Allergen Reactions   Stadol [Butorphanol] Itching    Per Leonette Most, RN   Keflex [Cephalexin] Hives   Other     General anesthesia - reaction was malignant hyperthermia   Pork-Derived Products Hives and Itching    Patient is allergic to pork  Toradol [Ketorolac Tromethamine] Hives   Objective:  There were no vitals filed for this visit. There is no height or weight on file to calculate BMI. Constitutional Well developed. Well nourished.  Vascular Dorsalis pedis pulses palpable bilaterally. Posterior tibial pulses palpable bilaterally. Capillary refill normal to all digits.  No cyanosis or clubbing noted. Pedal hair growth normal.  Neurologic Normal speech. Oriented to person, place, and time. Epicritic sensation to light touch grossly present bilaterally.  Dermatologic Bilateral moderate xerosis noted to both plantar feet.  Mild pain on palpation.  No open wounds or lesion noted no fissures noted  Orthopedic: Normal joint ROM without pain  or crepitus bilaterally. No visible deformities. No bony tenderness.   Radiographs: None Assessment:   1. Xerosis of skin    Plan:  Patient was evaluated and treated and all questions answered.  Bilateral xerosis -I explained to the patient the etiology of xerosis and various treatment options were extensively discussed.  I explained to the patient the importance of maintaining moisturization of the skin with application of over-the-counter lotion such as Eucerin or Luciderm.  Given that patient has failed over-the-counter options she will benefit from ammonium lactate ammonium lactate was sent to the pharmacy   No follow-ups on file.

## 2022-10-11 ENCOUNTER — Encounter: Payer: Self-pay | Admitting: Family Medicine

## 2022-10-16 DIAGNOSIS — Z419 Encounter for procedure for purposes other than remedying health state, unspecified: Secondary | ICD-10-CM | POA: Diagnosis not present

## 2022-10-20 DIAGNOSIS — N760 Acute vaginitis: Secondary | ICD-10-CM | POA: Diagnosis not present

## 2022-10-20 DIAGNOSIS — M6289 Other specified disorders of muscle: Secondary | ICD-10-CM | POA: Diagnosis not present

## 2022-11-15 DIAGNOSIS — Z419 Encounter for procedure for purposes other than remedying health state, unspecified: Secondary | ICD-10-CM | POA: Diagnosis not present

## 2022-11-24 NOTE — Progress Notes (Unsigned)
      Established patient visit   Patient: Kaitlyn Turner   DOB: 1986-03-02   37 y.o. Female  MRN: [id number] Visit Date: 11/25/2022  Today's healthcare provider: Ronnald Ramp, MD   No chief complaint on file.  Subjective      Patient states she was  yelling at someones dog in our yard afterwards i got so dizzy the room was spinning i thought i was going to black out and felt like i was going to throw up for a while after and was freezing but sweating im sure that is not normal   Medications: Outpatient Medications Prior to Visit  Medication Sig   acetaminophen (TYLENOL) 500 MG tablet Take 2 tablets (1,000 mg total) by mouth every 6 (six) hours as needed for mild pain.   ammonium lactate (AMLACTIN DAILY) 12 % lotion Apply 1 Application topically as needed for dry skin.   benzonatate (TESSALON) 100 MG capsule Take 2 capsules (200 mg total) by mouth every 8 (eight) hours.   chlorhexidine (PERIDEX) 0.12 % solution SMARTSIG:By Mouth   ciclopirox (LOPROX) 0.77 % SUSP Apply to affected areas twice daily   Efinaconazole 10 % SOLN Apply 1 application  topically daily.   HYDROcodone-acetaminophen (NORCO/VICODIN) 5-325 MG tablet Take 1 tablet by mouth every 6 (six) hours as needed.   ipratropium (ATROVENT) 0.06 % nasal spray Place 2 sprays into both nostrils 4 (four) times daily.   lidocaine (XYLOCAINE) 2 % solution Use as directed 15 mLs in the mouth or throat every 3 (three) hours as needed for mouth pain (swish and spit).   NEXPLANON 68 MG IMPL implant    ondansetron (ZOFRAN-ODT) 4 MG disintegrating tablet Take by mouth.   Prenatal Vit-Fe Fumarate-FA (PRENATAL MULTIVITAMIN) TABS tablet Take 1 tablet by mouth daily at 12 noon.   promethazine-dextromethorphan (PROMETHAZINE-DM) 6.25-15 MG/5ML syrup Take 5 mLs by mouth 4 (four) times daily as needed.   No facility-administered medications prior to visit.    Review of Systems  {Labs  Heme  Chem  Endocrine  Serology   Results Review (optional):23779}   Objective    There were no vitals taken for this visit. {Show previous vital signs (optional):23777}  Physical Exam  ***  No results found for any visits on 11/25/22.  Assessment & Plan     Problem List Items Addressed This Visit   None    No follow-ups on file.         Ronnald Ramp, MD  [facility] [phone] (phone) [phone] (fax)  [facility]

## 2022-11-25 ENCOUNTER — Encounter: Payer: Self-pay | Admitting: Family Medicine

## 2022-11-25 ENCOUNTER — Ambulatory Visit: Payer: Medicaid Other | Admitting: Family Medicine

## 2022-11-25 VITALS — BP 120/84 | HR 76 | Temp 98.7°F | Resp 13 | Ht 66.0 in | Wt 181.7 lb

## 2022-11-25 DIAGNOSIS — R42 Dizziness and giddiness: Secondary | ICD-10-CM

## 2022-11-25 DIAGNOSIS — R7989 Other specified abnormal findings of blood chemistry: Secondary | ICD-10-CM | POA: Diagnosis not present

## 2022-11-25 DIAGNOSIS — R55 Syncope and collapse: Secondary | ICD-10-CM | POA: Diagnosis not present

## 2022-11-25 NOTE — Assessment & Plan Note (Signed)
Chronic  Repeat TSH and free T4 today

## 2022-11-25 NOTE — Assessment & Plan Note (Signed)
Single episode of severe dizziness with near syncope, associated with headache, nausea, and vomiting. No recurrence since the episode. Neurological exam normal today. Possible vasovagal symptoms. -Chronic, intermittent, incidents are rarely occurring  -Order complete metabolic panel, folate, vitamin B12, and thyroid function tests. -Consider orthostatic testing, repeat EKG, and potentially a heart monitor if labs are normal. -Follow-up in 3 weeks to discuss results and further management.

## 2022-11-26 LAB — CMP14+EGFR
ALT: 8 IU/L (ref 0–32)
AST: 12 IU/L (ref 0–40)
Albumin: 4.5 g/dL (ref 3.9–4.9)
Alkaline Phosphatase: 93 IU/L (ref 44–121)
BUN/Creatinine Ratio: 19 (ref 9–23)
BUN: 13 mg/dL (ref 6–20)
Bilirubin Total: 0.5 mg/dL (ref 0.0–1.2)
CO2: 22 mmol/L (ref 20–29)
Calcium: 9.3 mg/dL (ref 8.7–10.2)
Chloride: 101 mmol/L (ref 96–106)
Creatinine, Ser: 0.68 mg/dL (ref 0.57–1.00)
Globulin, Total: 2.9 g/dL (ref 1.5–4.5)
Glucose: 69 mg/dL — ABNORMAL LOW (ref 70–99)
Potassium: 4.1 mmol/L (ref 3.5–5.2)
Sodium: 139 mmol/L (ref 134–144)
Total Protein: 7.4 g/dL (ref 6.0–8.5)
eGFR: 116 mL/min/{1.73_m2} (ref >59)

## 2022-11-26 LAB — CBC
Hematocrit: 40.2 % (ref 34.0–46.6)
Hemoglobin: 13.7 g/dL (ref 11.1–15.9)
MCH: 31.7 pg (ref 26.6–33.0)
MCHC: 34.1 g/dL (ref 31.5–35.7)
MCV: 93 fL (ref 79–97)
Platelets: 278 10*3/uL (ref 150–450)
RBC: 4.32 x10E6/uL (ref 3.77–5.28)
RDW: 11.9 % (ref 11.7–15.4)
WBC: 7.5 10*3/uL (ref 3.4–10.8)

## 2022-11-26 LAB — TSH+T4F+T3FREE
Free T4: 1.7 ng/dL (ref 0.82–1.77)
T3, Free: 2.8 pg/mL (ref 2.0–4.4)
TSH: 1 u[IU]/mL (ref 0.450–4.500)

## 2022-11-26 LAB — HEMOGLOBIN A1C
Est. average glucose Bld gHb Est-mCnc: 97 mg/dL
Hgb A1c MFr Bld: 5 % (ref 4.8–5.6)

## 2022-11-26 LAB — FOLATE: Folate: 16.5 ng/mL (ref >3.0)

## 2022-11-26 LAB — VITAMIN B12: Vitamin B-12: 332 pg/mL (ref 232–1245)

## 2022-11-29 ENCOUNTER — Encounter: Payer: Self-pay | Admitting: Emergency Medicine

## 2022-11-29 ENCOUNTER — Ambulatory Visit
Admission: EM | Admit: 2022-11-29 | Discharge: 2022-11-29 | Disposition: A | Discharge status: Home / Self Care | Payer: Medicaid Other | Attending: Internal Medicine | Admitting: Internal Medicine

## 2022-11-29 ENCOUNTER — Telehealth: Payer: Self-pay | Admitting: Internal Medicine

## 2022-11-29 DIAGNOSIS — S70362A Insect bite (nonvenomous), left thigh, initial encounter: Secondary | ICD-10-CM | POA: Diagnosis not present

## 2022-11-29 DIAGNOSIS — W57XXXA Bitten or stung by nonvenomous insect and other nonvenomous arthropods, initial encounter: Secondary | ICD-10-CM | POA: Diagnosis not present

## 2022-11-29 DIAGNOSIS — L03116 Cellulitis of left lower limb: Secondary | ICD-10-CM | POA: Diagnosis not present

## 2022-11-29 MED ORDER — LEVOFLOXACIN 500 MG PO TABS
500.0000 mg | ORAL_TABLET | Freq: Every day | ORAL | 0 refills | Status: DC
Start: 2022-11-29 — End: 2022-12-16

## 2022-11-29 MED ORDER — DOXYCYCLINE MONOHYDRATE 100 MG PO TABS: 100.0000 mg | ORAL_TABLET | Freq: Two times a day (BID) | ORAL | 0 refills | Status: DC

## 2022-11-29 MED ORDER — METHYLPREDNISOLONE 4 MG PO TBPK: ORAL_TABLET | ORAL | 0 refills | Status: DC

## 2022-11-29 NOTE — ED Triage Notes (Signed)
Insect bite to left thigh that started Saturday. Erythema continued to enlarge to the size of about a softball. Patient states she believes it was an ant. Took allegra hives and swelling last night that she believes improved the induration present, but erythema remains. Describes it as itching and painful. Continues to treat with hydrocortisone cream.

## 2022-11-29 NOTE — Telephone Encounter (Signed)
I d/c the Doxy and changed it to Levaquin

## 2022-11-29 NOTE — ED Provider Notes (Addendum)
Renaldo Fiddler    CSN: [id number] Arrival date & time: 11/29/22  0801      History   Chief Complaint Chief Complaint  Patient presents with   Insect Bite    HPI Kaitlyn Turner is a 37 y.o. female who presents with L thigh ant bite 2 days ago, and she smaked it when she saw it on her L thigh. She developed redness at the site there and L middle toe, but the  redness on this her thigh  has gotten larger by 30% since last night. She thinks she was bit by an ant.  She took allegra for the hives and swelling last night, which helped the hive, but the redness is still the same. She denies itching but is painful to palpate the center of the bite site which fels harder. Has been applying hydrocortisone. Denies fever.     Past Medical History:  Diagnosis Date   Adopted    Anemia    Clotting disorder (HCC)    Depression    Family history of adverse reaction to anesthesia    pt is adopted and is unsure of her biological moms history   Family history of alcoholism 01/08/2015   Per [employer]   History of suicidal ideation    Hypotension    Malignant hyperthermia    during [place] with dental procedure when she was a toddler   Mental disorder    Panic attacks   Personal history of PE (pulmonary embolism) 07/01/2021   Pulmonary embolism (HCC) 2013   was hospitalized for 2 weeks-saw hematologist but all testing was negative   Vaginal Pap smear, abnormal     Patient Active Problem List   Diagnosis Date Noted   Elevated serum free T4 level 11/25/2022   Postural dizziness with presyncope 11/25/2022   SUI (stress urinary incontinence, female) 05/31/2022   Lipoma of left axilla 03/25/2022   Toenail fungus 03/11/2022   Syncope, vasovagal 02/18/2022   Subcutaneous nodule of left upper extremity 02/18/2022   NSVD (normal spontaneous vaginal delivery) 07/01/2021   Postpartum hemorrhage, third stage, delivered 07/01/2021   History of malignant hyperthermia 06/17/2021   Nausea &  vomiting 04/23/2021   Low grade squamous intraepithelial lesion on cytologic smear of cervix (LGSIL) 12/23/2020   Overweight BMI=28.8 01/30/2020   H/O alcohol abuse--DUI 2019 01/30/2020   History of pulmonary embolism 01/30/2020   Chest pain 01/06/2019   MDD (major depressive disorder), recurrent episode (HCC) 08/21/2018   Suicidal ideation 08/20/2018   Family disruption due to divorce or legal separation 06/03/2015   Adjustment disorder with mixed anxiety and depressed mood 06/03/2015   Family history of alcoholism 01/08/2015    Past Surgical History:  Procedure Laterality Date   DENTAL RESTORATION/EXTRACTION WITH X-RAY  06/17/2022   Tooth Number 1 on right side   DENTAL SURGERY     as a toddler   LIPOMA EXCISION Left 03/25/2022   Procedure: EXCISION LIPOMA, left axillary;  Surgeon: Henrene Dodge, MD;  Location: [facility] ORS;  Service: General;  Laterality: Left;    OB History     Gravida  9   Para  4   Term  4   Preterm  0   AB  5   Living  4      SAB  3   IAB  2   Ectopic  0   Multiple  0   Live Births  4            Home  Medications    Prior to Admission medications   Medication Sig Start Date End Date Taking? Authorizing Provider  doxycycline (ADOXA) 100 MG tablet Take 1 tablet (100 mg total) by mouth 2 (two) times daily. 11/29/22  Yes Rodriguez-Southworth, Nettie Elm, PA-C  methylPREDNISolone (MEDROL DOSEPAK) 4 MG TBPK tablet Take as directed 11/29/22  Yes Rodriguez-Southworth, Nettie Elm, PA-C  acetaminophen (TYLENOL) 500 MG tablet Take 2 tablets (1,000 mg total) by mouth every 6 (six) hours as needed for mild pain. 03/25/22   Piscoya, Elita Quick, MD  ammonium lactate (AMLACTIN DAILY) 12 % lotion Apply 1 Application topically as needed for dry skin. 09/28/22   Candelaria Stagers, DPM  NEXPLANON 68 MG IMPL implant  10/01/21   [provider]  COLCRYS 0.6 MG tablet Take 1 tablet (0.6 mg total) by mouth daily. Patient not taking: Reported on 01/30/2020 06/26/19 05/30/20   Debbe Odea, MD  pantoprazole (PROTONIX) 40 MG tablet Take 1 tablet (40 mg total) by mouth daily. For 1 month. Patient not taking: Reported on 01/30/2020 06/21/19 05/30/20  Debbe Odea, MD  traZODone (DESYREL) 50 MG tablet Take 1 tablet (50 mg total) by mouth at bedtime and may repeat dose one time if needed. For sleep 08/23/18 01/06/19  Aldean Baker, NP    Family History Family History  Problem Relation Age of Onset   Cancer Mother        Unsure   Hypertension Father    Hypertension Maternal Grandmother    Heart disease Maternal Grandmother     Social History Social History   Tobacco Use   Smoking status: Never   Smokeless tobacco: Never  Vaping Use   Vaping status: Never Used  Substance Use Topics   Alcohol use: Not Currently   Drug use: No     Allergies   Stadol [butorphanol], Keflex [cephalexin], Other, Pork-derived products, and Toradol [ketorolac tromethamine]   Review of Systems Review of Systems As noted in HPI  Physical Exam Triage Vital Signs ED Triage Vitals [11/29/22 0811]  Encounter Vitals Group     BP 128/67     Systolic BP Percentile      Diastolic BP Percentile      Pulse Rate 75     Resp 16     Temp 99 F (37.2 C)     Temp Source Oral     SpO2 98 %     Weight      Height      Head Circumference      Peak Flow      Pain Score 2     Pain Loc      Pain Education      Exclude from Growth Chart    No data found.  Updated Vital Signs BP 128/67 (BP Location: Left Arm)   Pulse 75   Temp 99 F (37.2 C) (Oral)   Resp 16   SpO2 98%   Visual Acuity Right Eye Distance:   Left Eye Distance:   Bilateral Distance:    Right Eye Near:   Left Eye Near:    Bilateral Near:     Physical Exam Vitals and nursing note reviewed.  Constitutional:      General: She is not in acute distress.    Appearance: Normal appearance. She is not toxic-appearing.  HENT:     Right Ear: External ear normal.     Left Ear: External ear normal.   Eyes:     General: No scleral icterus.    Conjunctiva/sclera:  Conjunctivae normal.  Pulmonary:     Effort: Pulmonary effort is normal.  Skin:    General: Skin is warm and dry.     Comments: L THIGH- with erythema and warmth at the site bite with darker erythema of 3 cm x 3 cm and is a little indurated.The outer local reaction has a pink coloration and looks like a lage hive. But this area is not hot. There are no streaks noted.  The central area is tender to palpation, but not the outer area.   Neurological:     Mental Status: She is alert and oriented to person, place, and time.     Gait: Gait normal.  Psychiatric:        Mood and Affect: Mood normal.        Behavior: Behavior normal.        Thought Content: Thought content normal.        Judgment: Judgment normal.      UC Treatments / Results  Labs (all labs ordered are listed, but only abnormal results are displayed) Labs Reviewed - No data to display  EKG   Radiology No results found.  Procedures Procedures (including critical care time)  Medications Ordered in UC Medications - No data to display  Initial Impression / Assessment and Plan / UC Course  I have reviewed the triage vital signs and the nursing notes.  Insect bite with local reaction and cellulitis  I placed her on Allegra and Medrol dose pack as noted.  FU prn 11:45 am [facility] called saying the Doxy needs prior auth, so I d/c this and Sent Levaquin 500 mg every day x 7 days. See phone note.  Final Clinical Impressions(s) / UC Diagnoses   Final diagnoses:  Cellulitis of left thigh  Insect bite of left thigh with local reaction, initial encounter   Discharge Instructions   None    ED Prescriptions     Medication Sig Dispense Auth. Provider   doxycycline (ADOXA) 100 MG tablet Take 1 tablet (100 mg total) by mouth 2 (two) times daily. 20 tablet Rodriguez-Southworth, Yerick Eggebrecht, PA-C   methylPREDNISolone (MEDROL DOSEPAK) 4 MG TBPK tablet Take as  directed 21 tablet Rodriguez-Southworth, Nettie Elm, PA-C      PDMP not reviewed this encounter.   Garey Ham, PA-C 11/29/22 0840    Rodriguez-Southworth, Nettie Elm, PA-C 11/29/22 0841    Garey Ham, PA-C 11/29/22 1146

## 2022-12-15 NOTE — Progress Notes (Unsigned)
Established patient visit   Patient: Kaitlyn Turner   DOB: 12-03-1985   37 y.o. Female  MRN: [id number] Visit Date: 12/16/2022  Today's healthcare provider: Ronnald Ramp, MD   No chief complaint on file.  Subjective     HPI   Patient here to fu on lab results from 11/25/22. She denies any presyncope. She does still have some dizziness.  Patient C/O knot under her tongue x 2 days.  Last edited by Myles Lipps, CMA on 12/16/2022  9:19 AM.      Discussed the use of AI scribe software for clinical note transcription with the patient, who gave verbal consent to proceed.  History of Present Illness   The patient presents with a history of anxiety and recent episodes of feeling like she is going to pass out. She reports that these episodes are associated with periods of heightened stress and anxiety. The patient is currently dealing with multiple stressors, including her son leaving for [employer], starting a surgical tech program, and attempting to wean her baby from breastfeeding. She also mentions a stressful relationship with her seventeen-year-old daughter.  The patient has a history of a pulmonary embolism and pericarditis, but she does not believe her current symptoms are related to these conditions. She has undergone a thorough cardiac workup in the past, which did not reveal any significant issues.  The patient is also dealing with a concern about a small white lesion under her tongue, which she noticed a few days ago. She reports no pain, itching, or changes in size. She plans to have this evaluated by a dentist or orthodontist.  The patient is currently using a Nexplanon implant for contraception but has been experiencing side effects that she believes may be contributing to her symptoms. She plans to have the implant removed and is considering other contraceptive options. She has a history of a clotting disorder, which limits her options for hormonal  contraception.  The patient is also dealing with sleep deprivation due to her baby waking up frequently at night. She is attempting to transition the baby out of breastfeeding and co-sleeping, which has been challenging.  The patient's approach to her health issues is to address them one at a time, starting with the removal of the Nexplanon implant. She is open to considering anti-anxiety medication if her symptoms persist after the implant is removed.       Medications: Outpatient Medications Prior to Visit  Medication Sig   acetaminophen (TYLENOL) 500 MG tablet Take 2 tablets (1,000 mg total) by mouth every 6 (six) hours as needed for mild pain.   ammonium lactate (AMLACTIN DAILY) 12 % lotion Apply 1 Application topically as needed for dry skin.   [DISCONTINUED] levofloxacin (LEVAQUIN) 500 MG tablet Take 1 tablet (500 mg total) by mouth daily.   [DISCONTINUED] methylPREDNISolone (MEDROL DOSEPAK) 4 MG TBPK tablet Take as directed   [DISCONTINUED] NEXPLANON 68 MG IMPL implant    No facility-administered medications prior to visit.    Review of Systems       Objective    BP 123/78 (BP Location: Right Arm, Patient Position: Sitting, Cuff Size: Large)   Pulse 71   Temp 98.2 F (36.8 C) (Temporal)   Resp 12   Ht 5\' 6"  (1.676 m)   Wt 179 lb (81.2 kg)   SpO2 97%   BMI 28.89 kg/m      Physical Exam Vitals reviewed.  Constitutional:  General: She is not in acute distress.    Appearance: Normal appearance. She is not ill-appearing, toxic-appearing or diaphoretic.  HENT:     Mouth/Throat:     Mouth: Mucous membranes are moist. Oral lesions present.     Comments: Soft mobile, elongated area of mucosa below patient's tongue without ulceration/bleeding nor drainage  Eyes:     Conjunctiva/sclera: Conjunctivae normal.  Cardiovascular:     Rate and Rhythm: Normal rate and regular rhythm.     Pulses: Normal pulses.     Heart sounds: Normal heart sounds. No murmur heard.     No friction rub. No gallop.  Pulmonary:     Effort: Pulmonary effort is normal. No respiratory distress.     Breath sounds: Normal breath sounds. No stridor. No wheezing, rhonchi or rales.  Abdominal:     General: Bowel sounds are normal. There is no distension.     Palpations: Abdomen is soft.     Tenderness: There is no abdominal tenderness.  Musculoskeletal:     Right lower leg: No edema.     Left lower leg: No edema.  Skin:    Findings: No erythema or rash.  Neurological:     Mental Status: She is alert and oriented to person, place, and time.       No results found for any visits on 12/16/22.  Assessment & Plan     Problem List Items Addressed This Visit     Oral lesion    Noticed a small white lesion under the tongue. No pain, itchiness, or growth noted. Lesion appears to be redundant skin or a skin tag. -Refer to dentist or orthodontist for further evaluation.      Postural dizziness with presyncope - Primary    Episodes of feeling like passing out associated with anxiety. 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 No cardiac issues identified in previous workup. -Plan to remove Nexplanon and monitor for changes in symptoms. -Consider anti-anxiety medication if symptoms persist after Nexplanon removal in 2 months         Contraception Patient plans to remove Nexplanon due to suspected side effects. Patient has a history of clotting disorder, limiting options for hormonal contraception. Patient is aware of the need for contraception and plans to use condoms. -Advise patient to use reliable contraception to prevent unwanted pregnancy.    Return in about 2 months (around 02/15/2023) for dizzy spell.         Ronnald Ramp, MD  [facility] [phone] (phone) [phone] (fax)  [facility]

## 2022-12-16 ENCOUNTER — Encounter: Payer: Self-pay | Admitting: Family Medicine

## 2022-12-16 ENCOUNTER — Ambulatory Visit: Payer: Medicaid Other | Admitting: Family Medicine

## 2022-12-16 VITALS — BP 123/78 | HR 71 | Temp 98.2°F | Resp 12 | Ht 66.0 in | Wt 179.0 lb

## 2022-12-16 DIAGNOSIS — R42 Dizziness and giddiness: Secondary | ICD-10-CM

## 2022-12-16 DIAGNOSIS — R55 Syncope and collapse: Secondary | ICD-10-CM | POA: Diagnosis not present

## 2022-12-16 DIAGNOSIS — K1370 Unspecified lesions of oral mucosa: Secondary | ICD-10-CM | POA: Diagnosis not present

## 2022-12-16 DIAGNOSIS — Z419 Encounter for procedure for purposes other than remedying health state, unspecified: Secondary | ICD-10-CM | POA: Diagnosis not present

## 2022-12-16 NOTE — Assessment & Plan Note (Signed)
Episodes of feeling like passing out associated with anxiety. 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 No cardiac issues identified in previous workup. -Plan to remove Nexplanon and monitor for changes in symptoms. -Consider anti-anxiety medication if symptoms persist after Nexplanon removal in 2 months

## 2022-12-16 NOTE — Assessment & Plan Note (Signed)
Noticed a small white lesion under the tongue. No pain, itchiness, or growth noted. Lesion appears to be redundant skin or a skin tag. -Refer to dentist or orthodontist for further evaluation.

## 2022-12-20 DIAGNOSIS — Z3046 Encounter for surveillance of implantable subdermal contraceptive: Secondary | ICD-10-CM | POA: Diagnosis not present

## 2022-12-27 ENCOUNTER — Emergency Department
Admission: EM | Admit: 2022-12-27 | Discharge: 2022-12-27 | Disposition: A | Discharge status: Home / Self Care | Payer: Medicaid Other | Attending: Emergency Medicine | Admitting: Emergency Medicine

## 2022-12-27 ENCOUNTER — Other Ambulatory Visit: Payer: Self-pay

## 2022-12-27 DIAGNOSIS — R55 Syncope and collapse: Secondary | ICD-10-CM | POA: Insufficient documentation

## 2022-12-27 DIAGNOSIS — R42 Dizziness and giddiness: Secondary | ICD-10-CM | POA: Insufficient documentation

## 2022-12-27 LAB — CBC
HCT: 39.5 % (ref 36.0–46.0)
Hemoglobin: 13.1 g/dL (ref 12.0–15.0)
MCH: 31.2 pg (ref 26.0–34.0)
MCHC: 33.2 g/dL (ref 30.0–36.0)
MCV: 94 fL (ref 80.0–100.0)
Platelets: 269 10*3/uL (ref 150–400)
RBC: 4.2 MIL/uL (ref 3.87–5.11)
RDW: 11.7 % (ref 11.5–15.5)
WBC: 10.1 10*3/uL (ref 4.0–10.5)
nRBC: 0 % (ref 0.0–0.2)

## 2022-12-27 LAB — BASIC METABOLIC PANEL
Anion gap: 7 (ref 5–15)
BUN: 20 mg/dL (ref 6–20)
CO2: 25 mmol/L (ref 22–32)
Calcium: 9 mg/dL (ref 8.9–10.3)
Chloride: 110 mmol/L (ref 98–111)
Creatinine, Ser: 0.74 mg/dL (ref 0.44–1.00)
GFR, Estimated: >60 mL/min (ref >60)
Glucose, Bld: 105 mg/dL — ABNORMAL HIGH (ref 70–99)
Potassium: 3.9 mmol/L (ref 3.5–5.1)
Sodium: 142 mmol/L (ref 135–145)

## 2022-12-27 MED ORDER — SODIUM CHLORIDE 0.9 % IV SOLN: Freq: Once | INTRAVENOUS | Status: AC

## 2022-12-27 MED ORDER — ONDANSETRON HCL 4 MG/2ML IJ SOLN
4.0000 mg | Freq: Once | INTRAMUSCULAR | Status: AC
  Administered 2022-12-27: 4 mg via INTRAVENOUS
  Filled 2022-12-27: qty 2

## 2022-12-27 NOTE — ED Provider Notes (Signed)
[facility] Provider Note    Event Date/Time   First MD Initiated Contact with Patient 12/27/22 [phone]     (approximate)   History   Emesis and Dizziness   HPI  Kaitlyn Turner is a 37 y.o. female with a history of syncope who presents after an episode of dizziness followed by nausea.  Patient reports she was walking back in the kitchen, felt lightheaded so she sat down on the floor. She knew she thinks that she passed out.  Thereafter she was feeling nauseated.  She reports this has happened to her before and her PCP has told her she should probably see a cardiologist.  She denies chest pain, no shortness of breath.  She is feeling much better now.     Physical Exam   Triage Vital Signs: ED Triage Vitals  Encounter Vitals Group     BP 12/27/22 0817 119/71     Systolic BP Percentile --      Diastolic BP Percentile --      Pulse Rate 12/27/22 0817 81     Resp 12/27/22 0817 16     Temp 12/27/22 0816 97.8 F (36.6 C)     Temp src --      SpO2 12/27/22 0817 99 %     Weight 12/27/22 0818 81 kg (178 lb 9.2 oz)     Height 12/27/22 0818 1.676 m (5\' 6" )     Head Circumference --      Peak Flow --      Pain Score 12/27/22 0818 0     Pain Loc --      Pain Education --      Exclude from Growth Chart --     Most recent vital signs: Vitals:   12/27/22 0817 12/27/22 1033  BP: 119/71 120/70  Pulse: 81 78  Resp: 16 16  Temp:    SpO2: 99% 99%     General: Awake, no distress.  CV:  Good peripheral perfusion.  Regular rate and rhythm, no murmurs Resp:  Normal effort.  Abd:  No distention.  Soft, nontender Other:  No calf pain or swelling   ED Results / Procedures / Treatments   Labs (all labs ordered are listed, but only abnormal results are displayed) Labs Reviewed  BASIC METABOLIC PANEL - Abnormal; Notable for the following components:      Result Value   Glucose, Bld 105 (*)    All other components within normal limits  CBC      EKG  ED ECG REPORT I, Jene Every, the attending physician, personally viewed and interpreted this ECG.  Date: 12/27/2022  Rhythm: normal sinus rhythm QRS Axis: normal Intervals: normal ST/T Wave abnormalities: normal Narrative Interpretation: no evidence of acute ischemia    RADIOLOGY     PROCEDURES:  Critical Care performed:   Procedures   MEDICATIONS ORDERED IN ED: Medications  ondansetron (ZOFRAN) injection 4 mg (4 mg Intravenous Given 12/27/22 0937)  0.9 %  sodium chloride infusion (0 mLs Intravenous Stopped 12/27/22 1033)     IMPRESSION / MDM / ASSESSMENT AND PLAN / ED COURSE  I reviewed the triage vital signs and the nursing notes. Patient's presentation is most consistent with acute presentation with potential threat to life or bodily function.  Patient presents after a syncopal or near syncopal episode as detailed above.  Overall she is well-appearing and in no acute distress here.  EKG, lab work is generally reassuring.  Patient treated with IV  fluids, IV Zofran and is feeling much better.  No indication for admission at this time, appropriate for discharge, cardiology referral placed.  Return precautions discussed, patient agrees this plan.        FINAL CLINICAL IMPRESSION(S) / ED DIAGNOSES   Final diagnoses:  Near syncope     Rx / DC Orders   ED Discharge Orders          Ordered    Ambulatory referral to Cardiology       Comments: If you have not heard from the Cardiology office within the next 72 hours please call [phone].   12/27/22 1009             Note:  This document was prepared using Dragon voice recognition software and may include unintentional dictation errors.   Jene Every, MD 12/27/22 [id number]

## 2022-12-27 NOTE — ED Triage Notes (Signed)
Pt to ED For emesis and dizziness started this am. States started feeling dizzy and felt like was going to pass out. NAD noted, pt alert and oriented.

## 2022-12-28 NOTE — Progress Notes (Unsigned)
    MyChart Video Visit    Virtual Visit via Video Note   This format is felt to be most appropriate for this patient at this time. Physical exam was limited by quality of the video and audio technology used for the visit.   Patient location: Patient's home address   Provider location: [facility]  [address]  [place], [place] [address]   I discussed the limitations of evaluation and management by telemedicine and the availability of in person appointments. The patient expressed understanding and agreed to proceed.  Patient: Kaitlyn Turner   DOB: Aug 14, 1985   37 y.o. Female  MRN: [id number] Visit Date: 12/29/2022  Today's healthcare provider: Ronnald Ramp, MD   No chief complaint on file.  Subjective    HPI  ED follow up for near syncope  2021 echo reviewed with unremarkable results  Reviewed ED labs that showed no abnormalities  EKG from ED showed ***  Patient was referred for cardiology evaluation in ED     Medications: Outpatient Medications Prior to Visit  Medication Sig   acetaminophen (TYLENOL) 500 MG tablet Take 2 tablets (1,000 mg total) by mouth every 6 (six) hours as needed for mild pain.   ammonium lactate (AMLACTIN DAILY) 12 % lotion Apply 1 Application topically as needed for dry skin.   No facility-administered medications prior to visit.    Review of Systems  Last CBC Lab Results  Component Value Date   WBC 10.1 12/27/2022   HGB 13.1 12/27/2022   HCT 39.5 12/27/2022   MCV 94.0 12/27/2022   MCH 31.2 12/27/2022   RDW 11.7 12/27/2022   PLT 269 12/27/2022   Last metabolic panel Lab Results  Component Value Date   GLUCOSE 105 (H) 12/27/2022   NA 142 12/27/2022   K 3.9 12/27/2022   CL 110 12/27/2022   CO2 25 12/27/2022   BUN 20 12/27/2022   CREATININE 0.74 12/27/2022   GFRNONAA >60 12/27/2022   CALCIUM 9.0 12/27/2022   PROT 7.4 11/25/2022   ALBUMIN 4.5 11/25/2022   LABGLOB 2.9 11/25/2022    AGRATIO 1.7 02/18/2022   BILITOT 0.5 11/25/2022   ALKPHOS 93 11/25/2022   AST 12 11/25/2022   ALT 8 11/25/2022   ANIONGAP 7 12/27/2022     {See past labs  Heme  Chem  Endocrine  Serology  Results Review (optional):1}   Objective    LMP 12/04/2022   {Insert last BP/Wt (optional):23777}{See vitals history (optional):1}    Physical Exam     Assessment & Plan     Problem List Items Addressed This Visit   None    No follow-ups on file.     I discussed the assessment and treatment plan with the patient. The patient was provided an opportunity to ask questions and all were answered. The patient agreed with the plan and demonstrated an understanding of the instructions.   The patient was advised to call back or seek an in-person evaluation if the symptoms worsen or if the condition fails to improve as anticipated.  I provided *** minutes of non-face-to-face time during this encounter.   Ronnald Ramp, MD [facility] [phone] (phone) [phone] (fax)  [facility]

## 2022-12-29 ENCOUNTER — Telehealth (INDEPENDENT_AMBULATORY_CARE_PROVIDER_SITE_OTHER): Payer: Medicaid Other | Admitting: Family Medicine

## 2022-12-29 ENCOUNTER — Encounter: Payer: Self-pay | Admitting: Family Medicine

## 2022-12-29 DIAGNOSIS — R55 Syncope and collapse: Secondary | ICD-10-CM | POA: Diagnosis not present

## 2022-12-29 NOTE — Assessment & Plan Note (Signed)
Episode of syncope with associated vertigo, nausea, vomiting, and diaphoresis. No preceding headache. Post-event headache. No clear triggers identified. No recent substance use. Possible autonomic or endocrine etiology. -Schedule appointment before end of August 2024 for further evaluation. -Order comprehensive blood work to investigate potential endocrine or neurological causes.,  Would like to investigate autonomic instability symptoms, check orthostatic vitals -Consider referral to neurology based on lab results.  Noted high glucose level (105) in the absence of food intake, and previously low glucose level (69) post-prandial. -Investigate further with blood work during upcoming appointment.

## 2023-01-16 DIAGNOSIS — Z419 Encounter for procedure for purposes other than remedying health state, unspecified: Secondary | ICD-10-CM | POA: Diagnosis not present

## 2023-02-09 ENCOUNTER — Ambulatory Visit: Payer: Medicaid Other | Attending: Obstetrics and Gynecology

## 2023-02-15 DIAGNOSIS — Z419 Encounter for procedure for purposes other than remedying health state, unspecified: Secondary | ICD-10-CM | POA: Diagnosis not present

## 2023-02-17 NOTE — Progress Notes (Signed)
Established patient visit   Patient: Kaitlyn Turner   DOB: 09-17-1985   37 y.o. Female  MRN: [id number]  Visit Date: 02/18/2023  Today's healthcare provider: Ronnald Ramp, MD   Chief Complaint  Patient presents with   Follow-up    Cardiologist appt coming up but you wanted to see pt before that due to passing out and wants to check levels    Subjective     HPI     Follow-up    Additional comments: Cardiologist appt coming up but you wanted to see pt before that due to passing out and wants to check levels       Last edited by Clois Comber on 02/18/2023  1:05 PM.       Discussed the use of AI scribe software for clinical note transcription with the patient, who gave verbal consent to proceed.  History of Present Illness   The patient, with a history of vasovagal syncope, presents for further evaluation of her condition. She reports a recent episode of syncope, which occurred in the morning after waking up and starting her daily routine. The patient describes feeling dizzy and experiencing a sensation of the room spinning, followed by nausea and vomiting of foam-like substance. She denies any emotional triggers prior to this episode, which differentiates it from previous episodes that were often preceded by emotional stress. The patient also reports feeling 'weirdish' and slightly dizzy on another occasion after eating lunch, but did not lose consciousness. She managed this episode by consuming water and chocolate, suspecting dehydration as a possible trigger.  The patient has been trying to exercise more and has started taking a supplement, Ashwagandha, which she reports has improved her sleep. Despite these changes, she still occasionally feels 'iffy,' describing it as a mild dizziness. She denies any headaches or visual disturbances associated with these episodes.  The patient's blood sugar levels have been noted to be abnormally low despite eating, and on  one occasion, abnormally high when she had not eaten. She denies any known history of diabetes.      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    acetaminophen (TYLENOL) 500 MG tablet Take 2 tablets (1,000 mg total) by mouth every 6 (six) hours as needed for mild pain. 02/18/2023: As needed    No facility-administered medications prior to visit.    Review of Systems  Last metabolic panel Lab Results  Component Value Date   GLUCOSE 105 (H) 12/27/2022  NA 142 12/27/2022   K 3.9 12/27/2022   CL 110 12/27/2022   CO2 25 12/27/2022   BUN 20 12/27/2022   CREATININE 0.74 12/27/2022   GFRNONAA >60 12/27/2022   CALCIUM 9.0 12/27/2022   PROT 7.4 11/25/2022   ALBUMIN 4.5 11/25/2022   LABGLOB 2.9 11/25/2022   AGRATIO 1.7 02/18/2022   BILITOT 0.5 11/25/2022   ALKPHOS 93 11/25/2022   AST 12 11/25/2022   ALT 8 11/25/2022   ANIONGAP 7 12/27/2022   Last lipids No results found for: "CHOL", "HDL", "LDLCALC", "LDLDIRECT", "TRIG", "CHOLHDL" Last hemoglobin A1c Lab Results  Component Value Date   HGBA1C 5.0 11/25/2022   Last  thyroid functions Lab Results  Component Value Date   TSH 1.000 11/25/2022   Last vitamin B12 and Folate Lab Results  Component Value Date   VITAMINB12 332 11/25/2022   FOLATE 16.5 11/25/2022        Objective    BP 126/89 (BP Location: Right Arm, Patient Position: Sitting, Cuff Size: Normal)   Pulse 81   Ht 5\' 6"  (1.676 m)   Wt 179 lb 14.4 oz (81.6 kg)   SpO2 95%   BMI 29.04 kg/m  BP Readings from Last 3 Encounters:  02/18/23 126/89  12/27/22 120/70  12/16/22 123/78   Wt Readings from Last 3 Encounters:  02/18/23 179 lb 14.4 oz (81.6 kg)  12/27/22 178 lb 9.2 oz (81 kg)  12/16/22 179 lb (81.2 kg)       Physical Exam Vitals reviewed.  Constitutional:      General: She is not in acute distress.    Appearance: Normal appearance. She is not ill-appearing, toxic-appearing or diaphoretic.  Eyes:     Conjunctiva/sclera: Conjunctivae normal.  Cardiovascular:     Rate and Rhythm: Normal rate and regular rhythm.     Pulses: Normal pulses.     Heart sounds: Normal heart sounds. No murmur heard.    No friction rub. No gallop.  Pulmonary:     Effort: Pulmonary effort is normal. No respiratory distress.     Breath sounds: Normal breath sounds. No stridor. No wheezing, rhonchi or rales.  Abdominal:     General: Bowel sounds are normal. There is no distension.     Palpations: Abdomen is soft.     Tenderness: There is no abdominal tenderness.  Musculoskeletal:     Right lower leg: No edema.     Left lower leg: No edema.  Skin:    Findings: No erythema or rash.  Neurological:     Mental Status: She is alert and oriented to person, place, and time.     Cranial Nerves: No cranial nerve deficit.     Motor: No weakness.     Gait: Gait normal.        No results found for any visits on 02/18/23.  Assessment & Plan     Problem List Items Addressed This Visit     Syncope, vasovagal    Recurrent episodes of syncope, often associated with emotional triggers or physical  exertion. No recent episodes since last visit. Cardiology consult scheduled later this month. -Order labs including complete metabolic panel, CBC, BNP, insulin, C-peptide, and celiac disease panel for further evaluation. -Plan for follow-up after cardiology consult to determine if neurology evaluation is needed.      Relevant Orders   B Nat Peptide   Comprehensive metabolic panel   CBC   Insulin and C-Peptide   Celiac Disease Panel   Other Visit Diagnoses  Screening-pulmonary TB    -  Primary   Relevant Orders   QuantiFERON-TB Gold Plus          General Health Maintenance Patient is in a program requiring tuberculosis screening. -Order Quantiferon Gold test for tuberculosis screening. -Administer flu shot today.      No follow-ups on file.      Ronnald Ramp, MD  [facility] [phone] (phone) [phone] (fax)  [facility]

## 2023-02-18 ENCOUNTER — Ambulatory Visit (INDEPENDENT_AMBULATORY_CARE_PROVIDER_SITE_OTHER): Payer: Medicaid Other | Admitting: Family Medicine

## 2023-02-18 ENCOUNTER — Encounter: Payer: Self-pay | Admitting: Family Medicine

## 2023-02-18 VITALS — BP 126/89 | HR 81 | Ht 66.0 in | Wt 179.9 lb

## 2023-02-18 DIAGNOSIS — R55 Syncope and collapse: Secondary | ICD-10-CM | POA: Diagnosis not present

## 2023-02-18 DIAGNOSIS — Z111 Encounter for screening for respiratory tuberculosis: Secondary | ICD-10-CM

## 2023-02-18 NOTE — Assessment & Plan Note (Signed)
Recurrent episodes of syncope, often associated with emotional triggers or physical exertion. No recent episodes since last visit. Cardiology consult scheduled later this month. -Order labs including complete metabolic panel, CBC, BNP, insulin, C-peptide, and celiac disease panel for further evaluation. -Plan for follow-up after cardiology consult to determine if neurology evaluation is needed.

## 2023-02-19 LAB — MAGNESIUM: Magnesium: 1.9 mg/dL (ref 1.6–2.3)

## 2023-02-22 LAB — QUANTIFERON-TB GOLD PLUS
QuantiFERON Nil Value: 0.04 [IU]/mL
QuantiFERON TB1 Ag Value: 0.04 [IU]/mL
QuantiFERON TB2 Ag Value: 0.06 [IU]/mL

## 2023-02-22 LAB — COMPREHENSIVE METABOLIC PANEL
ALT: 8 [IU]/L (ref 0–32)
AST: 12 [IU]/L (ref 0–40)
Albumin: 4.5 g/dL (ref 3.9–4.9)
Alkaline Phosphatase: 94 [IU]/L (ref 44–121)
BUN/Creatinine Ratio: 23 (ref 9–23)
BUN: 15 mg/dL (ref 6–20)
Bilirubin Total: 0.3 mg/dL (ref 0.0–1.2)
CO2: 21 mmol/L (ref 20–29)
Calcium: 9.8 mg/dL (ref 8.7–10.2)
Chloride: 104 mmol/L (ref 96–106)
Creatinine, Ser: 0.66 mg/dL (ref 0.57–1.00)
Globulin, Total: 2.9 g/dL (ref 1.5–4.5)
Glucose: 85 mg/dL (ref 70–99)
Potassium: 4.1 mmol/L (ref 3.5–5.2)
Sodium: 139 mmol/L (ref 134–144)
Total Protein: 7.4 g/dL (ref 6.0–8.5)
eGFR: 117 mL/min/{1.73_m2} (ref >59)

## 2023-02-22 LAB — CBC
Hematocrit: 40.8 % (ref 34.0–46.6)
Hemoglobin: 13.4 g/dL (ref 11.1–15.9)
MCH: 31.5 pg (ref 26.6–33.0)
MCHC: 32.8 g/dL (ref 31.5–35.7)
MCV: 96 fL (ref 79–97)
Platelets: 288 10*3/uL (ref 150–450)
RBC: 4.26 x10E6/uL (ref 3.77–5.28)
RDW: 11.7 % (ref 11.7–15.4)
WBC: 7.3 10*3/uL (ref 3.4–10.8)

## 2023-02-22 LAB — CELIAC DISEASE PANEL
Endomysial IgA: NEGATIVE
IgA/Immunoglobulin A, Serum: 326 mg/dL (ref 87–352)

## 2023-02-22 LAB — INSULIN AND C-PEPTIDE, SERUM
C-Peptide: 2.6 ng/mL (ref 1.1–4.4)
INSULIN: 12.1 u[IU]/mL (ref 2.6–24.9)

## 2023-02-22 LAB — BRAIN NATRIURETIC PEPTIDE: BNP: 6.3 pg/mL (ref 0.0–100.0)

## 2023-03-09 ENCOUNTER — Ambulatory Visit: Payer: Medicaid Other | Attending: Cardiology | Admitting: Cardiology

## 2023-03-09 ENCOUNTER — Encounter: Payer: Self-pay | Admitting: Cardiology

## 2023-03-09 ENCOUNTER — Ambulatory Visit: Payer: Medicaid Other

## 2023-03-09 VITALS — BP 132/80 | HR 80 | Ht 66.0 in | Wt 180.0 lb

## 2023-03-09 DIAGNOSIS — R55 Syncope and collapse: Secondary | ICD-10-CM

## 2023-03-09 NOTE — Patient Instructions (Signed)
Medication Instructions:   Your physician recommends that you continue on your current medications as directed. Please refer to the Current Medication list given to you today.  *If you need a refill on your cardiac medications before your next appointment, please call your pharmacy*   Lab Work:  None Ordered  If you have labs (blood work) drawn today and your tests are completely normal, you will receive your results only by: MyChart Message (if you have MyChart) OR A paper copy in the mail If you have any lab test that is abnormal or we need to change your treatment, we will call you to review the results.   Testing/Procedures:  Your physician has requested that you have an echocardiogram. Echocardiography is a painless test that uses sound waves to create images of your heart. It provides your doctor with information about the size and shape of your heart and how well your heart's chambers and valves are working. This procedure takes approximately one hour. There are no restrictions for this procedure. Please do NOT wear cologne, perfume, aftershave, or lotions (deodorant is allowed). Please arrive 15 minutes prior to your appointment time.  Your physician has recommended that you wear a Zio monitor.   This monitor is a medical device that records the heart's electrical activity. Doctors most often use these monitors to diagnose arrhythmias. Arrhythmias are problems with the speed or rhythm of the heartbeat. The monitor is a small device applied to your chest. You can wear one while you do your normal daily activities. While wearing this monitor if you have any symptoms to push the button and record what you felt. Once you have worn this monitor for the period of time provider prescribed (Usually 14 days), you will return the monitor device in the postage paid box. Once it is returned they will download the data collected and provide [place] with a report which the provider will then review and  we will call you with those results. Important tips:  Avoid showering during the first 24 hours of wearing the monitor. Avoid excessive sweating to help maximize wear time. Do not submerge the device, no hot tubs, and no swimming pools. Keep any lotions or oils away from the patch. After 24 hours you may shower with the patch on. Take brief showers with your back facing the shower head.  Do not remove patch once it has been placed because that will interrupt data and decrease adhesive wear time. Push the button when you have any symptoms and write down what you were feeling. Once you have completed wearing your monitor, remove and place into box which has postage paid and place in your outgoing mailbox.  If for some reason you have misplaced your box then call our office and we can provide another box and/or mail it off for you.     Follow-Up: At [facility], you and your health needs are our priority.  As part of our continuing mission to provide you with exceptional heart care, we have created designated Provider Care Teams.  These Care Teams include your primary Cardiologist (physician) and Advanced Practice Providers (APPs -  Physician Assistants and Nurse Practitioners) who all work together to provide you with the care you need, when you need it.  We recommend signing up for the patient portal called "MyChart".  Sign up information is provided on this After Visit Summary.  MyChart is used to connect with patients for Virtual Visits (Telemedicine).  Patients are able to view  lab/test results, encounter notes, upcoming appointments, etc.  Non-urgent messages can be sent to your provider as well.   To learn more about what you can do with MyChart, go to [email or web address].    Your next appointment:    After Cardiac testing   Provider:   You may see Debbe Odea, MD or one of the following Advanced Practice Providers on your designated Care Team:   Nicolasa Ducking,  NP Eula Listen, PA-C Cadence Fransico Michael, PA-C Charlsie Quest, NP

## 2023-03-09 NOTE — Progress Notes (Signed)
Cardiology Office Note:    Date:  03/09/2023   ID:  Kaitlyn Turner, DOB [birth year], MRN [id number]  PCP:  Ronnald Ramp, MD   [facility] Providers Cardiologist:  Debbe Odea, MD     Referring MD: Brett Albino*   Chief Complaint  Patient presents with   New Patient (Initial Visit)    Referred for cardiology evaluation of near syncope episode with vomiting seen at ED on 12/27/22.  Reports multiple syncope episodes not related to positional changes.  Was previously seen in clinic in 2021 for chest pain.    History of Present Illness:    Kaitlyn Turner is a 37 y.o. female with a hx of PE presenting with syncope.  Presented to the ED 2 months ago with symptoms of dizziness and nausea.  She was at home in her kitchen, felt lightheaded and sat down.  A few minutes later, tried walking to her bedroom, dizzy and passed out.  Remember waking up complaining of being lightheaded, fianc and son helped her to the car and was taken to the ED.  Evaluated in the ED with EKG and troponins which were all unrevealing.  States having another episode 2 months prior to going outside her house.  Her dog ran outside, and attempt to chase the dog, felt dizzy, also felt flushed.  She sat down for a few minutes.  Overall denies chest pain or shortness of breath, denies any history of heart disease personally.  Does not know much about her family history as she is adopted.  Past Medical History:  Diagnosis Date   Adopted    Anemia    Clotting disorder (HCC)    Depression    Family history of adverse reaction to anesthesia    pt is adopted and is unsure of her biological moms history   Family history of alcoholism 01/08/2015   Per [employer]   H/O alcohol abuse--DUI 2019 01/30/2020   Lost drivers license, ankle monitor x 90 days     History of suicidal ideation    Hypotension    Malignant hyperthermia    during [place] with dental procedure when she was a toddler    Mental disorder    Panic attacks   Personal history of PE (pulmonary embolism) 07/01/2021   Pulmonary embolism (HCC) 2013   was hospitalized for 2 weeks-saw hematologist but all testing was negative   Vaginal Pap smear, abnormal     Past Surgical History:  Procedure Laterality Date   DENTAL RESTORATION/EXTRACTION WITH X-RAY  06/17/2022   Tooth Number 1 on right side   DENTAL SURGERY     as a toddler   LIPOMA EXCISION Left 03/25/2022   Procedure: EXCISION LIPOMA, left axillary;  Surgeon: Henrene Dodge, MD;  Location: [facility] ORS;  Service: General;  Laterality: Left;    Current Medications: Current Meds  Medication Sig   acetaminophen (TYLENOL) 500 MG tablet Take 2 tablets (1,000 mg total) by mouth every 6 (six) hours as needed for mild pain.   ammonium lactate (AMLACTIN DAILY) 12 % lotion Apply 1 Application topically as needed for dry skin.     Allergies:   Stadol [butorphanol], Keflex [cephalexin], Other, Pork-derived products, and Toradol [ketorolac tromethamine]   Social History   Socioeconomic History   Marital status: Significant Other    Spouse name: Not on file   Number of children: 3   Years of education: 14   Highest education level: Associate degree: occupational, Scientist, product/process development, or vocational program  Occupational  History   Not on file  Tobacco Use   Smoking status: Never   Smokeless tobacco: Never  Vaping Use   Vaping status: Never Used  Substance and Sexual Activity   Alcohol use: Not Currently   Drug use: No   Sexual activity: Yes    Partners: Male    Birth control/protection: None  Other Topics Concern   Not on file  Social History Narrative   Not on file   Social Determinants of Health   Financial Resource Strain: Not on file  Food Insecurity: Not on file  Transportation Needs: Not on file  Physical Activity: Not on file  Stress: Not on file  Social Connections: Not on file     Family History: The patient's family history includes Cancer in her  mother; Heart disease in her maternal grandmother; Hypertension in her father and maternal grandmother. She was adopted.  ROS:   Please see the history of present illness.     All other systems reviewed and are negative.  EKGs/Labs/Other Studies Reviewed:    The following studies were reviewed today:  EKG Interpretation Date/Time:  Wednesday March 09 2023 10:28:18 EDT Ventricular Rate:  80 PR Interval:  120 QRS Duration:  84 QT Interval:  378 QTC Calculation: 435 R Axis:   65  Text Interpretation: Normal sinus rhythm Normal ECG Confirmed by Debbe Odea ([id number]) on 03/09/2023 10:30:41 AM    Recent Labs: 11/25/2022: TSH 1.000 02/18/2023: ALT 8; BNP 6.3; BUN 15; Creatinine, Ser 0.66; Hemoglobin 13.4; Magnesium 1.9; Platelets 288; Potassium 4.1; Sodium 139  Recent Lipid Panel No results found for: "CHOL", "TRIG", "HDL", "CHOLHDL", "VLDL", "LDLCALC", "LDLDIRECT"   Risk Assessment/Calculations:             Physical Exam:    VS:  BP 132/80 (BP Location: Left Arm, Patient Position: Sitting, Cuff Size: Normal)   Pulse 80   Ht 5\' 6"  (1.676 m)   Wt 180 lb (81.6 kg)   SpO2 99%   BMI 29.05 kg/m     Wt Readings from Last 3 Encounters:  03/09/23 180 lb (81.6 kg)  02/18/23 179 lb 14.4 oz (81.6 kg)  12/27/22 178 lb 9.2 oz (81 kg)     GEN:  Well nourished, well developed in no acute distress HEENT: Normal NECK: No JVD; No carotid bruits CARDIAC: RRR, no murmurs, rubs, gallops RESPIRATORY:  Clear to auscultation without rales, wheezing or rhonchi  ABDOMEN: Soft, non-tender, non-distended MUSCULOSKELETAL:  No edema; No deformity  SKIN: Warm and dry NEUROLOGIC:  Alert and oriented x 3 PSYCHIATRIC:  Normal affect   ASSESSMENT:    1. Syncope and collapse   2. Near syncope    PLAN:    In order of problems listed above:  Syncope and collapse, etiology appears vasovagal with clear prodromal symptoms of nausea, vomiting, diaphoresis, dizziness prior to syncope.  Rule  out cardiac etiology with cardiac monitor and echocardiogram.  Safety emphasized, advised to sit when prodromal symptoms arise.  Follow-up after cardiac testing.     Medication Adjustments/Labs and Tests Ordered: Current medicines are reviewed at length with the patient today.  Concerns regarding medicines are outlined above.  Orders Placed This Encounter  Procedures   LONG TERM MONITOR (3-14 DAYS)   EKG 12-Lead   ECHOCARDIOGRAM COMPLETE   No orders of the defined types were placed in this encounter.   Patient Instructions  Medication Instructions:   Your physician recommends that you continue on your current medications as directed. Please refer to  the Current Medication list given to you today.  *If you need a refill on your cardiac medications before your next appointment, please call your pharmacy*   Lab Work:  None Ordered  If you have labs (blood work) drawn today and your tests are completely normal, you will receive your results only by: MyChart Message (if you have MyChart) OR A paper copy in the mail If you have any lab test that is abnormal or we need to change your treatment, we will call you to review the results.   Testing/Procedures:  Your physician has requested that you have an echocardiogram. Echocardiography is a painless test that uses sound waves to create images of your heart. It provides your doctor with information about the size and shape of your heart and how well your heart's chambers and valves are working. This procedure takes approximately one hour. There are no restrictions for this procedure. Please do NOT wear cologne, perfume, aftershave, or lotions (deodorant is allowed). Please arrive 15 minutes prior to your appointment time.  Your physician has recommended that you wear a Zio monitor.   This monitor is a medical device that records the heart's electrical activity. Doctors most often use these monitors to diagnose arrhythmias. Arrhythmias  are problems with the speed or rhythm of the heartbeat. The monitor is a small device applied to your chest. You can wear one while you do your normal daily activities. While wearing this monitor if you have any symptoms to push the button and record what you felt. Once you have worn this monitor for the period of time provider prescribed (Usually 14 days), you will return the monitor device in the postage paid box. Once it is returned they will download the data collected and provide [place] with a report which the provider will then review and we will call you with those results. Important tips:  Avoid showering during the first 24 hours of wearing the monitor. Avoid excessive sweating to help maximize wear time. Do not submerge the device, no hot tubs, and no swimming pools. Keep any lotions or oils away from the patch. After 24 hours you may shower with the patch on. Take brief showers with your back facing the shower head.  Do not remove patch once it has been placed because that will interrupt data and decrease adhesive wear time. Push the button when you have any symptoms and write down what you were feeling. Once you have completed wearing your monitor, remove and place into box which has postage paid and place in your outgoing mailbox.  If for some reason you have misplaced your box then call our office and we can provide another box and/or mail it off for you.      Follow-Up: At [facility], you and your health needs are our priority.  As part of our continuing mission to provide you with exceptional heart care, we have created designated Provider Care Teams.  These Care Teams include your primary Cardiologist (physician) and Advanced Practice Providers (APPs -  Physician Assistants and Nurse Practitioners) who all work together to provide you with the care you need, when you need it.  We recommend signing up for the patient portal called "MyChart".  Sign up information is provided  on this After Visit Summary.  MyChart is used to connect with patients for Virtual Visits (Telemedicine).  Patients are able to view lab/test results, encounter notes, upcoming appointments, etc.  Non-urgent messages can be sent to your provider as  well.   To learn more about what you can do with MyChart, go to [email or web address].    Your next appointment:    After Cardiac testing  Provider:   You may see Debbe Odea, MD or one of the following Advanced Practice Providers on your designated Care Team:   Nicolasa Ducking, NP Eula Listen, PA-C Cadence Fransico Michael, PA-C Charlsie Quest, NP    Signed, Debbe Odea, MD  03/09/2023 12:25 PM    [facility]

## 2023-03-12 DIAGNOSIS — R55 Syncope and collapse: Secondary | ICD-10-CM

## 2023-03-17 ENCOUNTER — Telehealth: Payer: Self-pay

## 2023-03-17 ENCOUNTER — Encounter: Payer: Self-pay | Admitting: Family Medicine

## 2023-03-17 DIAGNOSIS — Z789 Other specified health status: Secondary | ICD-10-CM

## 2023-03-17 NOTE — Telephone Encounter (Signed)
Copied from CRM [id number]. 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 Is this something that can be done in our in office lab? Please advise.

## 2023-03-17 NOTE — Telephone Encounter (Signed)
Varicella titer ordered.   Please visit the lab for blood work. You can visit the lab without an appointment Mon-Fri between 8A-11:30A or 1P-4:30P.   Pt MyChart message sent

## 2023-03-18 DIAGNOSIS — Z419 Encounter for procedure for purposes other than remedying health state, unspecified: Secondary | ICD-10-CM | POA: Diagnosis not present

## 2023-03-23 ENCOUNTER — Ambulatory Visit: Payer: Medicaid Other | Attending: Cardiology

## 2023-03-23 DIAGNOSIS — R55 Syncope and collapse: Secondary | ICD-10-CM | POA: Diagnosis not present

## 2023-03-23 LAB — ECHOCARDIOGRAM COMPLETE
AR max vel: 3.82 cm2
AV Area VTI: 4.08 cm2
AV Area mean vel: 3.75 cm2
AV Mean grad: 2 mm[Hg]
AV Peak grad: 4.1 mm[Hg]
Ao pk vel: 1.01 m/s
Area-P 1/2: 3.48 cm2
S' Lateral: 3.6 cm

## 2023-03-25 ENCOUNTER — Telehealth: Payer: Self-pay | Admitting: Cardiology

## 2023-03-25 NOTE — Telephone Encounter (Signed)
Patient would like a call back to discuss echo results. She mentions that she reviewed the report and has a few concerns, but she prefers to speak with the nurse to discuss once the results have been reviewed.

## 2023-03-29 ENCOUNTER — Ambulatory Visit: Payer: Medicaid Other | Admitting: Podiatry

## 2023-03-29 ENCOUNTER — Encounter: Payer: Self-pay | Admitting: Family Medicine

## 2023-03-29 ENCOUNTER — Telehealth: Payer: Medicaid Other | Admitting: Family Medicine

## 2023-03-29 DIAGNOSIS — R55 Syncope and collapse: Secondary | ICD-10-CM

## 2023-03-29 NOTE — Progress Notes (Signed)
MyChart Video Visit    Virtual Visit via Video Note   This format is felt to be most appropriate for this patient at this time. Physical exam was limited by quality of the video and audio technology used for the visit.   Patient location: Patient's home address   Provider location: [facility]  [address]  [place], [place] [address]   I discussed the limitations of evaluation and management by telemedicine and the availability of in person appointments. The patient expressed understanding and agreed to proceed.  Patient: Kaitlyn Turner   DOB: 1986/01/12   37 y.o. Female  MRN: [id number] Visit Date: 03/29/2023  Today's healthcare provider: Ronnald Ramp, MD   Chief Complaint  Patient presents with   review imaging    Subjective    HPI   Discussed the use of AI scribe software for clinical note transcription with the patient, who gave verbal consent to proceed.  History of Present Illness   The patient, a 37 year old female, has been experiencing episodes of syncope. Prior to these episodes, the patient reports prodromal symptoms of nausea, vomiting, sweating, and dizziness. The patient has been evaluated by cardiology and underwent an echocardiogram on November 6th. The echocardiogram showed a left ventricular fraction of 50-55%, no regional wall abnormalities, and normal right ventricle function. The mitral valve was normal in structure with no regurgitation or stenosis. The aortic valve was unable to be determined, but no regurgitation or stenosis was present.  The patient has noticed some changes in the echocardiogram report, including mild thickening and mild regurgitation of the mitral valve, which were not present in previous reports. The patient also noticed a decrease in the ejection fraction from previous reports, which were 65-70% and 60-65%, to the current 50-55%. The patient has also been experiencing chest pains, shortness  of breath, and dizziness, which she is concerned may be related to the mild mitral valve regurgitation.  The patient has a history of pulmonary embolism and has been experiencing shortness of breath and chest pains since that event. The patient's blood pressure was recorded as 130/80, which she is concerned may be slightly high for her age. The patient is also concerned about the potential for heart disease and is considering lifestyle changes to improve her heart health. The patient is not currently breastfeeding.       Past Medical History:  Diagnosis Date   Adopted    Anemia    Clotting disorder (HCC)    Depression    Family history of adverse reaction to anesthesia    pt is adopted and is unsure of her biological moms history   Family history of alcoholism 01/08/2015   Per [employer]   H/O alcohol abuse--DUI 2019 01/30/2020   Lost drivers license, ankle monitor x 90 days     History of suicidal ideation    Hypotension    Malignant hyperthermia    during [place] with dental procedure when she was a toddler   Mental disorder    Panic attacks   Personal history of PE (pulmonary embolism) 07/01/2021   Pulmonary embolism (HCC) 2013   was hospitalized for 2 weeks-saw hematologist but all testing was negative   Vaginal Pap smear, abnormal     Medications: Outpatient Medications Prior to Visit  Medication Sig   acetaminophen (TYLENOL) 500 MG tablet Take 2 tablets (1,000 mg total) by mouth every 6 (six) hours as needed for mild pain.   ammonium lactate (AMLACTIN DAILY) 12 %  lotion Apply 1 Application topically as needed for dry skin.   No facility-administered medications prior to visit.    Review of Systems      Objective    There were no vitals taken for this visit.  BP Readings from Last 3 Encounters:  03/09/23 132/80  02/18/23 126/89  12/27/22 120/70   Wt Readings from Last 3 Encounters:  03/09/23 180 lb (81.6 kg)  02/18/23 179 lb 14.4 oz (81.6 kg)  12/27/22 178 lb  9.2 oz (81 kg)        Physical Exam Vitals reviewed.  Constitutional:      General: She is not in acute distress.    Appearance: Normal appearance. She is not ill-appearing.  Pulmonary:     Effort: Pulmonary effort is normal. No respiratory distress.  Neurological:     Mental Status: She is alert.        Assessment & Plan     Problem List Items Addressed This Visit       Other   Syncope, vasovagal - Primary    Episodes evaluated by cardiology. Echocardiogram on November 6th showed normal left ventricular ejection fraction (50-55%), normal right ventricular function, and no significant valvular abnormalities. Cardiology review suggested vasovagal syncope due to prodromal symptoms of nausea, vomiting, sweating, and dizziness. No cardiac etiology identified. - Continue f/u with cardiology as previously scheduled  - Advise to sit down with onset of prodromal symptoms - Send information on vasovagal syncope        Mitral Valve Regurgitation Mild mitral valve regurgitation noted on echocardiogram. Reports chest pain, shortness of breath, and dizziness. No significant valvular abnormalities requiring surgical intervention or medication. Mild regurgitation not typically a cause for concern. - Monitor symptoms - Encourage regular follow-up with cardiology   Hypertension Blood pressure recorded at 130/80 mmHg. Concerned about potential correlation with valvular issues. Ideal blood pressure should be under 130/80 mmHg. Discussed lifestyle modifications to manage blood pressure. - Monitor blood pressure regularly - Encourage lifestyle modifications to manage blood pressure  General Health Maintenance Discussion on maintaining a healthy lifestyle to prevent worsening of cardiac conditions. Emphasis on regular exercise, healthy diet, and weight management. - Encourage regular exercise (20-30 minutes, 4-5 days a week) - Advise on healthy diet (reduce fatty, fried, sugary, and salty  foods) - Encourage maintaining a healthy body weight    Return in about 4 months (around 07/27/2023) for CPE.     I discussed the assessment and treatment plan with the patient. The patient was provided an opportunity to ask questions and all were answered. The patient agreed with the plan and demonstrated an understanding of the instructions.   The patient was advised to call back or seek an in-person evaluation if the symptoms worsen or if the condition fails to improve as anticipated.  I provided 18 minutes of non-face-to-face time during this encounter.   Ronnald Ramp, MD [facility] [phone] (phone) [phone] (fax)  [facility]

## 2023-03-29 NOTE — Patient Instructions (Signed)
SYNCOPE TREATMENTS  Treatment of ??????e is based upon the underlying cause. The goal of treatment is to prevent recurrences or more serious problems.  Vasovagal syncope treatment -- Vasovagal ??????e can usually be treated by learning to take precautions to avoid potential triggers and minimize the potential risk of harm. For example, if you faint while blood is being drawn, you may be instructed to lie down during the procedure. If you have a feeling that you will pass out during any activity, you should immediately lie down and elevate your legs. Staying well hydrated is one of the basics of treatment to prevent vasovagal ?????p?.  Counter-pressure maneuvers -- Counter-pressure maneuvers such as tensing your arms with clenched fists, leg pumping, and leg-crossing may stop a vasovagal syncopal episode, or at least delay it long enough that you can lie down with the feet elevated. Such maneuvers include:  ?Leg crossing while tensing the leg, abdominal, and buttock muscles.  ?Hand gripping, which involves gripping a rubber ball or similar object as hard as possible.  ?Arm tensing, which involves gripping one hand with the other while simultaneously moving both arms away from the body.  Orthostatic training -- In people with orthostatic hypotension and vasovagal ???????, orthostatic training (sometimes called standing training) may be useful to reduce susceptibility to future ?????pe. Techniques are designed to decrease pooling of blood in the extremities, which can allow the blood pressure to drop when you stand. Methods to decrease this problem include the following:  ?Wearing elastic compression stockings on the feet and lower legs  ?Contraction of the leg muscles before standing up from a seated or lying-down position and while standing for a prolonged period of time  ?Rising to stand slowly and in stages

## 2023-03-29 NOTE — Assessment & Plan Note (Signed)
Episodes evaluated by cardiology. Echocardiogram on November 6th showed normal left ventricular ejection fraction (50-55%), normal right ventricular function, and no significant valvular abnormalities. Cardiology review suggested vasovagal syncope due to prodromal symptoms of nausea, vomiting, sweating, and dizziness. No cardiac etiology identified. - Continue f/u with cardiology as previously scheduled  - Advise to sit down with onset of prodromal symptoms - Send information on vasovagal syncope

## 2023-03-31 ENCOUNTER — Encounter: Payer: Self-pay | Admitting: Podiatry

## 2023-03-31 ENCOUNTER — Ambulatory Visit: Payer: Medicaid Other | Admitting: Podiatry

## 2023-03-31 DIAGNOSIS — B351 Tinea unguium: Secondary | ICD-10-CM | POA: Diagnosis not present

## 2023-03-31 DIAGNOSIS — Z79899 Other long term (current) drug therapy: Secondary | ICD-10-CM

## 2023-03-31 NOTE — Progress Notes (Signed)
Subjective:  Patient ID: Kaitlyn Turner, female    DOB: 1986-05-06,  MRN: [id number]  Chief Complaint  Patient presents with   Nail Problem    "I'm not breast feeding anymore.  So, I want to start the medication."    37 y.o. female presents with the above complaint.  Patient presents with thickened and onychodystrophy mycotic toenails x 10 mild pain on palpation.  Patient would like to discuss treatment options she wants to do oral medication now that she is not breast-feeding anymore.  No chance of pregnancy.   Review of Systems: Negative except as noted in the HPI. Denies N/V/F/Ch.  Past Medical History:  Diagnosis Date   Adopted    Anemia    Clotting disorder (HCC)    Depression    Family history of adverse reaction to anesthesia    pt is adopted and is unsure of her biological moms history   Family history of alcoholism 01/08/2015   Per [employer]   H/O alcohol abuse--DUI 2019 01/30/2020   Lost drivers license, ankle monitor x 90 days     History of suicidal ideation    Hypotension    Malignant hyperthermia    during [place] with dental procedure when she was a toddler   Mental disorder    Panic attacks   Personal history of PE (pulmonary embolism) 07/01/2021   Pulmonary embolism (HCC) 2013   was hospitalized for 2 weeks-saw hematologist but all testing was negative   Vaginal Pap smear, abnormal     Current Outpatient Medications:    acetaminophen (TYLENOL) 500 MG tablet, Take 2 tablets (1,000 mg total) by mouth every 6 (six) hours as needed for mild pain., Disp: , Rfl:    ammonium lactate (AMLACTIN DAILY) 12 % lotion, Apply 1 Application topically as needed for dry skin., Disp: 400 g, Rfl: 0  Social History   Tobacco Use  Smoking Status Never  Smokeless Tobacco Never    Allergies  Allergen Reactions   Stadol [Butorphanol] Itching    Per Leonette Most, RN   Keflex [Cephalexin] Hives   Other     General anesthesia - reaction was malignant hyperthermia    Pork-Derived Products Hives and Itching    Patient is allergic to pork   Toradol [Ketorolac Tromethamine] Hives   Objective:  There were no vitals filed for this visit. There is no height or weight on file to calculate BMI. Constitutional Well developed. Well nourished.  Vascular Dorsalis pedis pulses palpable bilaterally. Posterior tibial pulses palpable bilaterally. Capillary refill normal to all digits.  No cyanosis or clubbing noted. Pedal hair growth normal.  Neurologic Normal speech. Oriented to person, place, and time. Epicritic sensation to light touch grossly present bilaterally.  Dermatologic Nails thickened and onychodystrophy mycotic toenails x 10 Skin within normal limits  Orthopedic: Normal joint ROM without pain or crepitus bilaterally. No visible deformities. No bony tenderness.   Radiographs: None Assessment:   1. Long-term use of high-risk medication   2. Nail fungus   3. Onychomycosis due to dermatophyte    Plan:  Patient was evaluated and treated and all questions answered.  Bilateral onychomycosis toenails x 10 -Educated the patient on the etiology of onychomycosis and various treatment options associated with improving the fungal load.  I explained to the patient that there is 3 treatment options available to treat the onychomycosis including topical, p.o., laser treatment.  Patient elected to undergo p.o. options with Lamisil/terbinafine therapy.  In order for me to start the medication  therapy, I explained to the patient the importance of evaluating the liver and obtaining the liver function test.  Once the liver function test comes back normal I will start him on 36-month course of Lamisil therapy.  Patient understood all risk and would like to proceed with Lamisil therapy.  I have asked the patient to immediately stop the Lamisil therapy if she has any reactions to it and call the office or go to the emergency room right away.  Patient states  understanding   No follow-ups on file.

## 2023-04-08 DIAGNOSIS — Z79899 Other long term (current) drug therapy: Secondary | ICD-10-CM | POA: Diagnosis not present

## 2023-04-09 LAB — HEPATIC FUNCTION PANEL
ALT: 8 [IU]/L (ref 0–32)
AST: 8 [IU]/L (ref 0–40)
Albumin: 4.5 g/dL (ref 3.9–4.9)
Alkaline Phosphatase: 89 [IU]/L (ref 44–121)
Bilirubin Total: 0.3 mg/dL (ref 0.0–1.2)
Bilirubin, Direct: 0.12 mg/dL (ref 0.00–0.40)
Total Protein: 7.4 g/dL (ref 6.0–8.5)

## 2023-04-11 MED ORDER — TERBINAFINE HCL 250 MG PO TABS: 250.0000 mg | ORAL_TABLET | Freq: Every day | ORAL | 0 refills | Status: DC

## 2023-04-11 NOTE — Addendum Note (Signed)
Addended by: Nicholes Rough on: 04/11/2023 07:46 AM   Modules accepted: Orders

## 2023-04-17 DIAGNOSIS — Z419 Encounter for procedure for purposes other than remedying health state, unspecified: Secondary | ICD-10-CM | POA: Diagnosis not present

## 2023-04-29 ENCOUNTER — Ambulatory Visit: Payer: Medicaid Other | Attending: Cardiology | Admitting: Cardiology

## 2023-04-29 ENCOUNTER — Encounter: Payer: Self-pay | Admitting: Cardiology

## 2023-04-29 VITALS — BP 114/90 | HR 65 | Ht 66.0 in | Wt 180.2 lb

## 2023-04-29 DIAGNOSIS — R55 Syncope and collapse: Secondary | ICD-10-CM

## 2023-04-29 DIAGNOSIS — R079 Chest pain, unspecified: Secondary | ICD-10-CM

## 2023-04-29 NOTE — Patient Instructions (Signed)
Medication Instructions:   Your physician recommends that you continue on your current medications as directed. Please refer to the Current Medication list given to you today.  *If you need a refill on your cardiac medications before your next appointment, please call your pharmacy*   Lab Work:  None Ordered  If you have labs (blood work) drawn today and your tests are completely normal, you will receive your results only by: MyChart Message (if you have MyChart) OR A paper copy in the mail If you have any lab test that is abnormal or we need to change your treatment, we will call you to review the results.   Testing/Procedures:  None Ordered   Follow-Up: At [facility], you and your health needs are our priority.  As part of our continuing mission to provide you with exceptional heart care, we have created designated Provider Care Teams.  These Care Teams include your primary Cardiologist (physician) and Advanced Practice Providers (APPs -  Physician Assistants and Nurse Practitioners) who all work together to provide you with the care you need, when you need it.  We recommend signing up for the patient portal called "MyChart".  Sign up information is provided on this After Visit Summary.  MyChart is used to connect with patients for Virtual Visits (Telemedicine).  Patients are able to view lab/test results, encounter notes, upcoming appointments, etc.  Non-urgent messages can be sent to your provider as well.   To learn more about what you can do with MyChart, go to [email or web address].    Your next appointment:  AS NEEDED 

## 2023-04-29 NOTE — Progress Notes (Signed)
Cardiology Office Note:    Date:  04/29/2023   ID:  Kaitlyn Turner, DOB 1985/06/09, MRN [id number]  PCP:  Ronnald Ramp, MD   [facility] Providers Cardiologist:  Debbe Odea, MD     Referring MD: Brett Albino*   Chief Complaint  Patient presents with   Follow-up    Discuss cardiac testing results.  Patient reports more frequent chest pains this week.      History of Present Illness:    Kaitlyn Turner is a 37 y.o. female with a hx of PE presenting for follow-up.  Previously seen due to a syncopal episode.  Etiology appears vasovagal, echo and cardiac monitor was placed to evaluate any cardiac etiologies.  Has not had any episodes of syncope since.  She had prodromal symptoms of dizziness and nausea prior to passing out.  Has had occasional nonspecific, nonexertional chest pain ongoing several months now.  Was told in the past this was secondary from scar tissue due to previous PE.   Past Medical History:  Diagnosis Date   Adopted    Anemia    Clotting disorder (HCC)    Depression    Family history of adverse reaction to anesthesia    pt is adopted and is unsure of her biological moms history   Family history of alcoholism 01/08/2015   Per [employer]   H/O alcohol abuse--DUI 2019 01/30/2020   Lost drivers license, ankle monitor x 90 days     History of suicidal ideation    Hypotension    Malignant hyperthermia    during [place] with dental procedure when she was a toddler   Mental disorder    Panic attacks   Personal history of PE (pulmonary embolism) 07/01/2021   Pulmonary embolism (HCC) 2013   was hospitalized for 2 weeks-saw hematologist but all testing was negative   Vaginal Pap smear, abnormal     Past Surgical History:  Procedure Laterality Date   DENTAL RESTORATION/EXTRACTION WITH X-RAY  06/17/2022   Tooth Number 1 on right side   DENTAL SURGERY     as a toddler   LIPOMA EXCISION Left 03/25/2022   Procedure: EXCISION  LIPOMA, left axillary;  Surgeon: Henrene Dodge, MD;  Location: [facility] ORS;  Service: General;  Laterality: Left;    Current Medications: Current Meds  Medication Sig   acetaminophen (TYLENOL) 500 MG tablet Take 2 tablets (1,000 mg total) by mouth every 6 (six) hours as needed for mild pain.   ammonium lactate (AMLACTIN DAILY) 12 % lotion Apply 1 Application topically as needed for dry skin.   terbinafine (LAMISIL) 250 MG tablet Take 1 tablet (250 mg total) by mouth daily.     Allergies:   Stadol [butorphanol], Keflex [cephalexin], Other, Pork-derived products, and Toradol [ketorolac tromethamine]   Social History   Socioeconomic History   Marital status: Significant Other    Spouse name: Not on file   Number of children: 3   Years of education: 14   Highest education level: Associate degree: occupational, Scientist, product/process development, or vocational program  Occupational History   Not on file  Tobacco Use   Smoking status: Never   Smokeless tobacco: Never  Vaping Use   Vaping status: Never Used  Substance and Sexual Activity   Alcohol use: Not Currently   Drug use: No   Sexual activity: Yes    Partners: Male    Birth control/protection: None  Other Topics Concern   Not on file  Social History Narrative   Not  on file   Social Drivers of Health   Financial Resource Strain: Not on file  Food Insecurity: Not on file  Transportation Needs: Not on file  Physical Activity: Not on file  Stress: Not on file  Social Connections: Not on file     Family History: The patient's family history includes Cancer in her mother; Heart disease in her maternal grandmother; Hypertension in her father and maternal grandmother. She was adopted.  ROS:   Please see the history of present illness.     All other systems reviewed and are negative.  EKGs/Labs/Other Studies Reviewed:    The following studies were reviewed today:  EKG Interpretation Date/Time:  Friday April 29 2023 10:40:08 EST Ventricular  Rate:  65 PR Interval:  116 QRS Duration:  90 QT Interval:  408 QTC Calculation: 424 R Axis:   47  Text Interpretation: Normal sinus rhythm Normal ECG Confirmed by Debbe Odea ([id number]) on 04/29/2023 10:45:48 AM    Recent Labs: 11/25/2022: TSH 1.000 02/18/2023: BNP 6.3; BUN 15; Creatinine, Ser 0.66; Hemoglobin 13.4; Magnesium 1.9; Platelets 288; Potassium 4.1; Sodium 139 04/08/2023: ALT 8  Recent Lipid Panel No results found for: "CHOL", "TRIG", "HDL", "CHOLHDL", "VLDL", "LDLCALC", "LDLDIRECT"   Risk Assessment/Calculations:         Physical Exam:    VS:  BP (!) 114/90 (BP Location: Left Arm, Patient Position: Sitting, Cuff Size: Normal)   Pulse 65   Ht 5\' 6"  (1.676 m)   Wt 180 lb 3.2 oz (81.7 kg)   LMP 03/18/2023 (Approximate)   SpO2 98%   BMI 29.09 kg/m     Wt Readings from Last 3 Encounters:  04/29/23 180 lb 3.2 oz (81.7 kg)  03/09/23 180 lb (81.6 kg)  02/18/23 179 lb 14.4 oz (81.6 kg)     GEN:  Well nourished, well developed in no acute distress HEENT: Normal NECK: No JVD; No carotid bruits CARDIAC: RRR, no murmurs, rubs, gallops RESPIRATORY:  Clear to auscultation without rales, wheezing or rhonchi  ABDOMEN: Soft, non-tender, non-distended MUSCULOSKELETAL:  No edema; No deformity  SKIN: Warm and dry NEUROLOGIC:  Alert and oriented x 3 PSYCHIATRIC:  Normal affect   ASSESSMENT:    1. Syncope and collapse   2. Vasovagal syncope   3. Chest pain, unspecified type     PLAN:    In order of problems listed above:  Syncope and collapse, echo 11/24 normal EF, diastolic function normal, normal strain.  Cardiac monitor showed no significant arrhythmias.  Etiology appears vasovagal with clear prodromal symptoms of nausea, vomiting, diaphoresis, dizziness prior to syncope.  Continue safety precautions when prodromal symptoms arise.  Chest pain, appears noncardiac.  Echo with normal EF as above.  CCTA was considered, patient declined.  Follow-up as needed.      Medication Adjustments/Labs and Tests Ordered: Current medicines are reviewed at length with the patient today.  Concerns regarding medicines are outlined above.  Orders Placed This Encounter  Procedures   EKG 12-Lead   No orders of the defined types were placed in this encounter.   Patient Instructions  Medication Instructions:   Your physician recommends that you continue on your current medications as directed. Please refer to the Current Medication list given to you today.  *If you need a refill on your cardiac medications before your next appointment, please call your pharmacy*   Lab Work:  None Ordered  If you have labs (blood work) drawn today and your tests are completely normal, you will receive your  results only by: MyChart Message (if you have MyChart) OR A paper copy in the mail If you have any lab test that is abnormal or we need to change your treatment, we will call you to review the results.   Testing/Procedures:  None Ordered   Follow-Up: At [facility], you and your health needs are our priority.  As part of our continuing mission to provide you with exceptional heart care, we have created designated Provider Care Teams.  These Care Teams include your primary Cardiologist (physician) and Advanced Practice Providers (APPs -  Physician Assistants and Nurse Practitioners) who all work together to provide you with the care you need, when you need it.  We recommend signing up for the patient portal called "MyChart".  Sign up information is provided on this After Visit Summary.  MyChart is used to connect with patients for Virtual Visits (Telemedicine).  Patients are able to view lab/test results, encounter notes, upcoming appointments, etc.  Non-urgent messages can be sent to your provider as well.   To learn more about what you can do with MyChart, go to [email or web address].    Your next appointment:    AS NEEDED   Signed, Debbe Odea, MD   04/29/2023 11:29 AM    [facility]

## 2023-05-18 DIAGNOSIS — Z419 Encounter for procedure for purposes other than remedying health state, unspecified: Secondary | ICD-10-CM | POA: Diagnosis not present

## 2023-06-08 ENCOUNTER — Encounter: Payer: Self-pay | Admitting: Family Medicine

## 2023-06-08 ENCOUNTER — Ambulatory Visit
Admission: EM | Admit: 2023-06-08 | Discharge: 2023-06-08 | Disposition: A | Discharge status: Home / Self Care | Payer: Medicaid Other | Attending: Family Medicine | Admitting: Family Medicine

## 2023-06-08 DIAGNOSIS — Z20828 Contact with and (suspected) exposure to other viral communicable diseases: Secondary | ICD-10-CM

## 2023-06-08 DIAGNOSIS — J069 Acute upper respiratory infection, unspecified: Secondary | ICD-10-CM

## 2023-06-08 LAB — RESP PANEL BY RT-PCR (FLU A&B, COVID) ARPGX2
Influenza A by PCR: NEGATIVE
Influenza B by PCR: NEGATIVE
SARS Coronavirus 2 by RT PCR: NEGATIVE

## 2023-06-08 MED ORDER — ONDANSETRON 4 MG PO TBDP: 4.0000 mg | ORAL_TABLET | Freq: Three times a day (TID) | ORAL | 0 refills | Status: DC | PRN

## 2023-06-08 NOTE — ED Provider Notes (Signed)
[facility]    CSN: [id number] Arrival date & time: 06/08/23  1115      History   Chief Complaint Chief Complaint  Patient presents with   Cough   Fever   Diarrhea    HPI Kaitlyn Turner is a 38 y.o. female.   HPI  History obtained from the patient. Kaitlyn Turner presents for diarrhea, fever, fatigue, headache, cough that started 2 days ago.  No rhinorrhea and diarrhea. Has nausea.  Took some ibuprofen this morning.      Past Medical History:  Diagnosis Date   Adopted    Anemia    Clotting disorder (HCC)    Depression    Family history of adverse reaction to anesthesia    pt is adopted and is unsure of her biological moms history   Family history of alcoholism 01/08/2015   Per [employer]   H/O alcohol abuse--DUI 2019 01/30/2020   Lost drivers license, ankle monitor x 90 days     History of suicidal ideation    Hypotension    Malignant hyperthermia    during [place] with dental procedure when she was a toddler   Mental disorder    Panic attacks   Personal history of PE (pulmonary embolism) 07/01/2021   Pulmonary embolism (HCC) 2013   was hospitalized for 2 weeks-saw hematologist but all testing was negative   Vaginal Pap smear, abnormal     Patient Active Problem List   Diagnosis Date Noted   Oral lesion 12/16/2022   Elevated serum free T4 level 11/25/2022   Postural dizziness with presyncope 11/25/2022   SUI (stress urinary incontinence, female) 05/31/2022   Toenail fungus 03/11/2022   Syncope, vasovagal 02/18/2022   History of malignant hyperthermia 06/17/2021   Low grade squamous intraepithelial lesion on cytologic smear of cervix (LGSIL) 12/23/2020   Overweight BMI=28.8 01/30/2020   History of pulmonary embolism 01/30/2020   MDD (major depressive disorder), recurrent episode (HCC) 08/21/2018   Adjustment disorder with mixed anxiety and depressed mood 06/03/2015    Past Surgical History:  Procedure Laterality Date   DENTAL  RESTORATION/EXTRACTION WITH X-RAY  06/17/2022   Tooth Number 1 on right side   DENTAL SURGERY     as a toddler   LIPOMA EXCISION Left 03/25/2022   Procedure: EXCISION LIPOMA, left axillary;  Surgeon: Henrene Dodge, MD;  Location: [facility] ORS;  Service: General;  Laterality: Left;    OB History     Gravida  9   Para  4   Term  4   Preterm  0   AB  5   Living  4      SAB  3   IAB  2   Ectopic  0   Multiple  0   Live Births  4            Home Medications    Prior to Admission medications   Medication Sig Start Date End Date Taking? Authorizing Provider  ondansetron (ZOFRAN-ODT) 4 MG disintegrating tablet Take 1 tablet (4 mg total) by mouth every 8 (eight) hours as needed. 06/08/23  Yes Ladora Osterberg, DO  terbinafine (LAMISIL) 250 MG tablet Take 1 tablet (250 mg total) by mouth daily. 04/11/23  Yes Candelaria Stagers, DPM  acetaminophen (TYLENOL) 500 MG tablet Take 2 tablets (1,000 mg total) by mouth every 6 (six) hours as needed for mild pain. 03/25/22   Piscoya, Elita Quick, MD  ammonium lactate (AMLACTIN DAILY) 12 % lotion Apply 1 Application topically as needed  for dry skin. 09/28/22   Candelaria Stagers, DPM  COLCRYS 0.6 MG tablet Take 1 tablet (0.6 mg total) by mouth daily. Patient not taking: Reported on 01/30/2020 06/26/19 05/30/20  Debbe Odea, MD  pantoprazole (PROTONIX) 40 MG tablet Take 1 tablet (40 mg total) by mouth daily. For 1 month. Patient not taking: Reported on 01/30/2020 06/21/19 05/30/20  Debbe Odea, MD  traZODone (DESYREL) 50 MG tablet Take 1 tablet (50 mg total) by mouth at bedtime and may repeat dose one time if needed. For sleep 08/23/18 01/06/19  Aldean Baker, NP    Family History Family History  Adopted: Yes  Problem Relation Age of Onset   Cancer Mother        Unsure   Hypertension Father    Hypertension Maternal Grandmother    Heart disease Maternal Grandmother     Social History Social History   Tobacco Use   Smoking status: Never    Smokeless tobacco: Never  Vaping Use   Vaping status: Never Used  Substance Use Topics   Alcohol use: Not Currently   Drug use: No     Allergies   Stadol [butorphanol], Keflex [cephalexin], Other, Pork-derived products, and Toradol [ketorolac tromethamine]   Review of Systems Review of Systems: negative unless otherwise stated in HPI.      Physical Exam Triage Vital Signs ED Triage Vitals  Encounter Vitals Group     BP 06/08/23 1205 137/89     Systolic BP Percentile --      Diastolic BP Percentile --      Pulse Rate 06/08/23 1205 88     Resp 06/08/23 1205 18     Temp 06/08/23 1205 98.6 F (37 C)     Temp Source 06/08/23 1205 Oral     SpO2 06/08/23 1205 98 %     Weight --      Height --      Head Circumference --      Peak Flow --      Pain Score 06/08/23 1203 0     Pain Loc --      Pain Education --      Exclude from Growth Chart --    No data found.  Updated Vital Signs BP 137/89 (BP Location: Right Arm)   Pulse 88   Temp 98.6 F (37 C) (Oral)   Resp 18   LMP 05/12/2023 (Approximate)   SpO2 98%   Visual Acuity Right Eye Distance:   Left Eye Distance:   Bilateral Distance:    Right Eye Near:   Left Eye Near:    Bilateral Near:     Physical Exam GEN:     alert, non-toxic appearing female in no distress    HENT:  mucus membranes moist, oropharyngeal without lesions or erythema, no tonsillar hypertrophy or exudates, no nasal discharge, EYES:   no scleral injection or discharge RESP:  no increased work of breathing, clear to auscultation bilaterally CVS:   regular rate and rhythm Skin:   warm and dry, no rash on visible skin    UC Treatments / Results  Labs (all labs ordered are listed, but only abnormal results are displayed) Labs Reviewed  RESP PANEL BY RT-PCR (FLU A&B, COVID) [id number]    EKG   Radiology No results found.  Procedures Procedures (including critical care time)  Medications Ordered in UC Medications - No data to  display  Initial Impression / Assessment and Plan / UC Course  I have reviewed the  triage vital signs and the nursing notes.  Pertinent labs & imaging results that were available during my care of the patient were reviewed by me and considered in my medical decision making (see chart for details).       Pt is a 38 y.o. female who presents for 2 days of respiratory symptoms. Lania is afebrile here. Satting well on room air. Overall pt is non-toxic appearing, well hydrated, without respiratory distress. Pulmonary exam is unremarkable.  COVID and influenza panel obtained. She was exposed to influenza by her daughter. Pt to quarantine until COVID test results or longer if positive.  I will call patient with test results, if positive. History most consistent with viral  illness. Discussed symptomatic treatment.  Explained lack of efficacy of antibiotics in viral disease.  Typical duration of symptoms discussed.  Zofran prescribed for nausea.  Return and ED precautions given and voiced understanding. Discussed MDM, treatment plan and plan for follow-up with patient who agrees with plan.   COVID and influenza are negative.  Final Clinical Impressions(s) / UC Diagnoses   Final diagnoses:  Viral URI with cough  Exposure to influenza     Discharge Instructions      We will contact you if your COVID or influenza  test is positive.  Please quarantine while you wait for the results.  If your test is negative you may resume normal activities.  If your test is positive, quarantine until you are without a fever for at least 24 hours without fever-lowering (Tylenol/Motrin) medications.    If your were prescribed medication, stop by the pharmacy to pick them up.   You can take Tylenol and/or Ibuprofen as needed for fever reduction and pain relief.    For cough: honey 1/2 to 1 teaspoon (you can dilute the honey in water or another fluid).  You can also use guaifenesin and dextromethorphan for cough.  You can use a humidifier for chest congestion and cough.  If you don't have a humidifier, you can sit in the bathroom with the hot shower running.      For sore throat: try warm salt water gargles, Mucinex sore throat cough drops or cepacol lozenges, throat spray, warm tea or water with lemon/honey, popsicles or ice, or OTC cold relief medicine for throat discomfort. You can also purchase chloraseptic spray at the pharmacy or dollar store.   For congestion: take a daily anti-histamine like Zyrtec, Claritin, and a oral decongestant, such as pseudoephedrine.  You can also use Flonase 1-2 sprays in each nostril daily. Afrin is also a good option, if you do not have high blood pressure.    It is important to stay hydrated: drink plenty of fluids (water, gatorade/powerade/pedialyte, juices, or teas) to keep your throat moisturized and help further relieve irritation/discomfort.    Return or go to the Emergency Department if symptoms worsen or do not improve in the next few days      ED Prescriptions     Medication Sig Dispense Auth. Provider   ondansetron (ZOFRAN-ODT) 4 MG disintegrating tablet Take 1 tablet (4 mg total) by mouth every 8 (eight) hours as needed. 20 tablet Katha Cabal, DO      PDMP not reviewed this encounter.   Katha Cabal, DO 06/08/23 1332

## 2023-06-08 NOTE — ED Triage Notes (Signed)
Sx x 2 days  Cough Diarrhea  Fever  IBU this morning

## 2023-06-08 NOTE — Discharge Instructions (Addendum)
 We will contact you if your COVID or influenza test is positive.  Please quarantine while you wait for the results.  If your test is negative you may resume normal activities.  If your test is positive, quarantine until you are without a fever for at least 24 hours without fever-lowering (Tylenol/Motrin) medications.    If your were prescribed medication, stop by the pharmacy to pick them up.   You can take Tylenol and/or Ibuprofen as needed for fever reduction and pain relief.    For cough: honey 1/2 to 1 teaspoon (you can dilute the honey in water or another fluid).  You can also use guaifenesin and dextromethorphan for cough. You can use a humidifier for chest congestion and cough.  If you don't have a humidifier, you can sit in the bathroom with the hot shower running.      For sore throat: try warm salt water gargles, Mucinex sore throat cough drops or cepacol lozenges, throat spray, warm tea or water with lemon/honey, popsicles or ice, or OTC cold relief medicine for throat discomfort. You can also purchase chloraseptic spray at the pharmacy or dollar store.   For congestion: take a daily anti-histamine like Zyrtec, Claritin, and a oral decongestant, such as pseudoephedrine.  You can also use Flonase 1-2 sprays in each nostril daily. Afrin is also a good option, if you do not have high blood pressure.    It is important to stay hydrated: drink plenty of fluids (water, gatorade/powerade/pedialyte, juices, or teas) to keep your throat moisturized and help further relieve irritation/discomfort.    Return or go to the Emergency Department if symptoms worsen or do not improve in the next few days

## 2023-06-18 DIAGNOSIS — Z419 Encounter for procedure for purposes other than remedying health state, unspecified: Secondary | ICD-10-CM | POA: Diagnosis not present

## 2023-07-06 ENCOUNTER — Telehealth: Payer: Medicaid Other | Admitting: Physician Assistant

## 2023-07-06 DIAGNOSIS — N760 Acute vaginitis: Secondary | ICD-10-CM

## 2023-07-06 DIAGNOSIS — B9689 Other specified bacterial agents as the cause of diseases classified elsewhere: Secondary | ICD-10-CM

## 2023-07-06 MED ORDER — METRONIDAZOLE 500 MG PO TABS
500.0000 mg | ORAL_TABLET | Freq: Two times a day (BID) | ORAL | 0 refills | Status: DC
Start: 2023-07-06 — End: 2023-12-26

## 2023-07-06 NOTE — Progress Notes (Signed)

## 2023-07-06 NOTE — Progress Notes (Signed)
 I have spent 5 minutes in review of e-visit questionnaire, review and updating patient chart, medical decision making and response to patient.   Piedad Climes, PA-C

## 2023-07-16 DIAGNOSIS — Z419 Encounter for procedure for purposes other than remedying health state, unspecified: Secondary | ICD-10-CM | POA: Diagnosis not present

## 2023-07-26 DIAGNOSIS — R102 Pelvic and perineal pain: Secondary | ICD-10-CM | POA: Diagnosis not present

## 2023-07-26 DIAGNOSIS — Z113 Encounter for screening for infections with a predominantly sexual mode of transmission: Secondary | ICD-10-CM | POA: Diagnosis not present

## 2023-08-02 ENCOUNTER — Ambulatory Visit: Payer: Medicaid Other | Admitting: Podiatry

## 2023-08-23 ENCOUNTER — Ambulatory Visit (INDEPENDENT_AMBULATORY_CARE_PROVIDER_SITE_OTHER): Admitting: Podiatry

## 2023-08-23 DIAGNOSIS — Z79899 Other long term (current) drug therapy: Secondary | ICD-10-CM

## 2023-08-23 DIAGNOSIS — B351 Tinea unguium: Secondary | ICD-10-CM | POA: Diagnosis not present

## 2023-08-23 NOTE — Addendum Note (Signed)
 Addended by: Myriam Jacobson on: 08/23/2023 03:47 PM   Modules accepted: Orders

## 2023-08-23 NOTE — Progress Notes (Signed)
 Subjective:  Patient ID: Kaitlyn Turner, female    DOB: 03/20/1986,  MRN: [id number]  Chief Complaint  Patient presents with   Nail Problem    Pt stated that her nails are doing better     38 y.o. female presents with the above complaint.  Patient presents with thickened and onychodystrophy mycotic toenails x 10 mild pain on palpation.  Patient would like to discuss treatment options she wants to do oral medication now that she is not breast-feeding anymore.  No chance of pregnancy.   Review of Systems: Negative except as noted in the HPI. Denies N/V/F/Ch.  Past Medical History:  Diagnosis Date   Adopted    Anemia    Clotting disorder (HCC)    Depression    Family history of adverse reaction to anesthesia    pt is adopted and is unsure of her biological moms history   Family history of alcoholism 01/08/2015   Per [employer]   H/O alcohol abuse--DUI 2019 01/30/2020   Lost drivers license, ankle monitor x 90 days     History of suicidal ideation    Hypotension    Malignant hyperthermia    during [place] with dental procedure when she was a toddler   Mental disorder    Panic attacks   Personal history of PE (pulmonary embolism) 07/01/2021   Pulmonary embolism (HCC) 2013   was hospitalized for 2 weeks-saw hematologist but all testing was negative   Vaginal Pap smear, abnormal     Current Outpatient Medications:    acetaminophen (TYLENOL) 500 MG tablet, Take 2 tablets (1,000 mg total) by mouth every 6 (six) hours as needed for mild pain., Disp: , Rfl:    ammonium lactate (AMLACTIN DAILY) 12 % lotion, Apply 1 Application topically as needed for dry skin., Disp: 400 g, Rfl: 0   metroNIDAZOLE (FLAGYL) 500 MG tablet, Take 1 tablet (500 mg total) by mouth 2 (two) times daily., Disp: 14 tablet, Rfl: 0   ondansetron (ZOFRAN-ODT) 4 MG disintegrating tablet, Take 1 tablet (4 mg total) by mouth every 8 (eight) hours as needed., Disp: 20 tablet, Rfl: 0   terbinafine (LAMISIL) 250 MG tablet,  Take 1 tablet (250 mg total) by mouth daily., Disp: 90 tablet, Rfl: 0  Social History   Tobacco Use  Smoking Status Never  Smokeless Tobacco Never    Allergies  Allergen Reactions   Stadol [Butorphanol] Itching    Per Leonette Most, RN   Keflex [Cephalexin] Hives   Other     General anesthesia - reaction was malignant hyperthermia   Pork-Derived Products Hives and Itching    Patient is allergic to pork   Toradol [Ketorolac Tromethamine] Hives   Objective:  There were no vitals filed for this visit. There is no height or weight on file to calculate BMI. Constitutional Well developed. Well nourished.  Vascular Dorsalis pedis pulses palpable bilaterally. Posterior tibial pulses palpable bilaterally. Capillary refill normal to all digits.  No cyanosis or clubbing noted. Pedal hair growth normal.  Neurologic Normal speech. Oriented to person, place, and time. Epicritic sensation to light touch grossly present bilaterally.  Dermatologic Nails thickened and onychodystrophy mycotic toenails x 10 Skin within normal limits  Orthopedic: Normal joint ROM without pain or crepitus bilaterally. No visible deformities. No bony tenderness.   Radiographs: None Assessment:   1. Long-term use of high-risk medication   2. Nail fungus   3. Onychomycosis due to dermatophyte     Plan:  Patient was evaluated and treated  and all questions answered.  Bilateral onychomycosis toenails x 10~second round -Educated the patient on the etiology of onychomycosis and various treatment options associated with improving the fungal load.  I explained to the patient that there is 3 treatment options available to treat the onychomycosis including topical, p.o., laser treatment.  Patient elected to undergo p.o. options with Lamisil/terbinafine therapy.  In order for me to start the medication therapy, I explained to the patient the importance of evaluating the liver and obtaining the liver function test.   Once the liver function test comes back normal I will start him on 63-month course of Lamisil therapy.  Patient understood all risk and would like to proceed with Lamisil therapy.  I have asked the patient to immediately stop the Lamisil therapy if she has any reactions to it and call the office or go to the emergency room right away.  Patient states understanding   No follow-ups on file.

## 2023-08-24 DIAGNOSIS — R102 Pelvic and perineal pain: Secondary | ICD-10-CM | POA: Diagnosis not present

## 2023-08-24 DIAGNOSIS — Z79899 Other long term (current) drug therapy: Secondary | ICD-10-CM | POA: Diagnosis not present

## 2023-08-25 LAB — HEPATIC FUNCTION PANEL
ALT: 9 IU/L (ref 0–32)
AST: 11 IU/L (ref 0–40)
Albumin: 4.9 g/dL (ref 3.9–4.9)
Alkaline Phosphatase: 72 IU/L (ref 44–121)
Bilirubin Total: 0.8 mg/dL (ref 0.0–1.2)
Bilirubin, Direct: 0.23 mg/dL (ref 0.00–0.40)
Total Protein: 7.8 g/dL (ref 6.0–8.5)

## 2023-08-25 MED ORDER — TERBINAFINE HCL 250 MG PO TABS: 250.0000 mg | ORAL_TABLET | Freq: Every day | ORAL | 0 refills | Status: AC

## 2023-08-25 NOTE — Addendum Note (Signed)
 Addended by: Nicholes Rough on: 08/25/2023 08:01 AM   Modules accepted: Orders

## 2023-08-27 DIAGNOSIS — Z419 Encounter for procedure for purposes other than remedying health state, unspecified: Secondary | ICD-10-CM | POA: Diagnosis not present

## 2023-09-26 DIAGNOSIS — Z419 Encounter for procedure for purposes other than remedying health state, unspecified: Secondary | ICD-10-CM | POA: Diagnosis not present

## 2023-10-27 DIAGNOSIS — Z419 Encounter for procedure for purposes other than remedying health state, unspecified: Secondary | ICD-10-CM | POA: Diagnosis not present

## 2023-11-10 ENCOUNTER — Ambulatory Visit
Admission: EM | Admit: 2023-11-10 | Discharge: 2023-11-10 | Disposition: A | Discharge status: Home / Self Care | Attending: Emergency Medicine | Admitting: Emergency Medicine

## 2023-11-10 ENCOUNTER — Encounter: Payer: Self-pay | Admitting: Emergency Medicine

## 2023-11-10 DIAGNOSIS — A084 Viral intestinal infection, unspecified: Secondary | ICD-10-CM

## 2023-11-10 MED ORDER — ONDANSETRON 4 MG PO TBDP
4.0000 mg | ORAL_TABLET | Freq: Once | ORAL | Status: AC
  Administered 2023-11-10: 4 mg via ORAL

## 2023-11-10 MED ORDER — ONDANSETRON 4 MG PO TBDP: 4.0000 mg | ORAL_TABLET | Freq: Three times a day (TID) | ORAL | 0 refills | Status: DC | PRN

## 2023-11-10 NOTE — ED Provider Notes (Signed)
 Kaitlyn Turner    CSN: [id number] Arrival date & time: 11/10/23  1048      History   Chief Complaint Chief Complaint  Patient presents with   Nausea   Emesis    HPI Kaitlyn Turner is a 38 y.o. female.   Patient presents for evaluation of nausea and vomiting beginning this morning, has vomited twice, intolerable to food but able to tolerate small amounts of liquids.  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  Has attempted use of Tylenol  as she had a headache this morning which has resolved.  Send denies diarrhea, abdominal pain, fever or URI symptoms.  Past Medical History:  Diagnosis Date   Adopted    Anemia    Clotting disorder (HCC)    Depression    Family history of adverse reaction to anesthesia    pt is adopted and is unsure of her biological moms history   Family history of alcoholism 01/08/2015   Per [employer]   H/O alcohol abuse--DUI 2019 01/30/2020   Lost drivers license, ankle monitor x 90 days     History of suicidal ideation    Hypotension    Malignant hyperthermia    during [place] with dental procedure when she was a toddler   Mental disorder    Panic attacks   Personal history of PE (pulmonary embolism) 07/01/2021   Pulmonary embolism (HCC) 2013   was hospitalized for 2 weeks-saw hematologist but all testing was negative   Vaginal Pap smear, abnormal     Patient Active Problem List   Diagnosis Date Noted   Oral lesion 12/16/2022   Elevated serum free T4 level 11/25/2022   Postural dizziness with presyncope 11/25/2022   SUI (stress urinary incontinence, female) 05/31/2022   Toenail fungus 03/11/2022   Syncope, vasovagal 02/18/2022   History of malignant hyperthermia 06/17/2021   Low grade squamous intraepithelial lesion on cytologic smear of cervix (LGSIL) 12/23/2020   Overweight BMI=28.8 01/30/2020   History of pulmonary embolism 01/30/2020   MDD (major depressive disorder), recurrent episode  (HCC) 08/21/2018   Adjustment disorder with mixed anxiety and depressed mood 06/03/2015    Past Surgical History:  Procedure Laterality Date   DENTAL RESTORATION/EXTRACTION WITH X-RAY  06/17/2022   Tooth Number 1 on right side   DENTAL SURGERY     as a toddler   LIPOMA EXCISION Left 03/25/2022   Procedure: EXCISION LIPOMA, left axillary;  Surgeon: Desiderio Schanz, MD;  Location: [facility] ORS;  Service: General;  Laterality: Left;    OB History     Gravida  9   Para  4   Term  4   Preterm  0   AB  5   Living  4      SAB  3   IAB  2   Ectopic  0   Multiple  0   Live Births  4            Home Medications    Prior to Admission medications   Medication Sig Start Date End Date Taking? Authorizing Provider  ondansetron  (ZOFRAN -ODT) 4 MG disintegrating tablet Take 1 tablet (4 mg total) by mouth every 8 (eight) hours as needed. 11/10/23  Yes Peri Kreft, Shelba JONELLE, NP  acetaminophen  (TYLENOL ) 500 MG tablet Take 2 tablets (1,000 mg total) by mouth every 6 (six) hours as needed for mild pain. 03/25/22   Desiderio Schanz, MD  ammonium lactate  (AMLACTIN DAILY) 12 % lotion Apply  1 Application topically as needed for dry skin. 09/28/22   Tobie Franky SQUIBB, DPM  metroNIDAZOLE  (FLAGYL ) 500 MG tablet Take 1 tablet (500 mg total) by mouth 2 (two) times daily. 07/06/23   Gladis Elsie BROCKS, PA-C  terbinafine  (LAMISIL ) 250 MG tablet Take 1 tablet (250 mg total) by mouth daily. 04/11/23   Tobie Franky SQUIBB, DPM  terbinafine  (LAMISIL ) 250 MG tablet Take 1 tablet (250 mg total) by mouth daily. 08/25/23   Tobie Franky SQUIBB, DPM  COLCRYS  0.6 MG tablet Take 1 tablet (0.6 mg total) by mouth daily. Patient not taking: Reported on 01/30/2020 06/26/19 05/30/20  Darliss Rogue, MD  pantoprazole  (PROTONIX ) 40 MG tablet Take 1 tablet (40 mg total) by mouth daily. For 1 month. Patient not taking: Reported on 01/30/2020 06/21/19 05/30/20  Darliss Rogue, MD  traZODone  (DESYREL ) 50 MG tablet Take 1 tablet (50 mg total) by  mouth at bedtime and may repeat dose one time if needed. For sleep 08/23/18 01/06/19  Wonda Clarita BRAVO, NP    Family History Family History  Adopted: Yes  Problem Relation Age of Onset   Cancer Mother        Unsure   Hypertension Father    Hypertension Maternal Grandmother    Heart disease Maternal Grandmother     Social History Social History   Tobacco Use   Smoking status: Never   Smokeless tobacco: Never  Vaping Use   Vaping status: Never Used  Substance Use Topics   Alcohol use: Not Currently   Drug use: No     Allergies   Stadol [butorphanol], Keflex [cephalexin], Other, Pork-derived products, and Toradol [ketorolac tromethamine]   Review of Systems Review of Systems   Physical Exam Triage Vital Signs ED Triage Vitals  Encounter Vitals Group     BP 11/10/23 1059 118/84     Girls Systolic BP Percentile --      Girls Diastolic BP Percentile --      Boys Systolic BP Percentile --      Boys Diastolic BP Percentile --      Pulse Rate 11/10/23 1059 64     Resp 11/10/23 1059 20     Temp 11/10/23 1059 98.5 F (36.9 C)     Temp src --      SpO2 11/10/23 1059 100 %     Weight --      Height --      Head Circumference --      Peak Flow --      Pain Score 11/10/23 1102 0     Pain Loc --      Pain Education --      Exclude from Growth Chart --    No data found.  Updated Vital Signs BP 118/84 (BP Location: Left Arm)   Pulse 64   Temp 98.5 F (36.9 C)   Resp 20   LMP 10/18/2023 (Approximate)   SpO2 100%   Breastfeeding No   Visual Acuity Right Eye Distance:   Left Eye Distance:   Bilateral Distance:    Right Eye Near:   Left Eye Near:    Bilateral Near:     Physical Exam Constitutional:      Appearance: Normal appearance.   Eyes:     Extraocular Movements: Extraocular movements intact.   Pulmonary:     Effort: Pulmonary effort is normal.  Abdominal:     General: Abdomen is flat. Bowel sounds are increased.     Palpations: Abdomen is soft.  Tenderness: There is no abdominal tenderness.   Neurological:     Mental Status: She is alert and oriented to person, place, and time. Mental status is at baseline.      UC Treatments / Results  Labs (all labs ordered are listed, but only abnormal results are displayed) Labs Reviewed - No data to display  EKG   Radiology No results found.  Procedures Procedures (including critical care time)  Medications Ordered in UC Medications  ondansetron  (ZOFRAN -ODT) disintegrating tablet 4 mg (4 mg Oral Given 11/10/23 1115)    Initial Impression / Assessment and Plan / UC Course  I have reviewed the triage vital signs and the nursing notes.  Pertinent labs & imaging results that were available during my care of the patient were reviewed by me and considered in my medical decision making (see chart for details).  Viral gastroenteritis  Vital signs stable, patient in no signs of distress nontoxic-appearing, increased bowel sounds on exam with no abdominal tenderness, stable for outpatient management, given Zofran  ODT in clinic and on reevaluation able to tolerate fluids, Zofran  prescribed for home use and recommended supportive care with increase fluid intake until symptoms resolve with follow-up as needed Final Clinical Impressions(s) / UC Diagnoses   Final diagnoses:  Viral gastroenteritis     Discharge Instructions      Your symptoms are most likely caused by a virus, it will work its way out your system over the next few days  You can use zofran  every 8 hours as needed for nausea, be mindful this medication may make you drowsy, take the first dose at home to see how it affects your body  May use over-the-counter Imodium if you begin to have diarrhea  You can use over-the-counter ibuprofen  or Tylenol , which ever you have at home, to help manage fevers  Continue to promote hydration throughout the day by using electrolyte replacement solution such as Gatorade, body  armor, Pedialyte, which ever you have at home  Try eating bland foods such as bread, rice, toast, fruit which are easier on the stomach to digest, avoid foods that are overly spicy, overly seasoned or greasy    ED Prescriptions     Medication Sig Dispense Auth. Provider   ondansetron  (ZOFRAN -ODT) 4 MG disintegrating tablet Take 1 tablet (4 mg total) by mouth every 8 (eight) hours as needed. 20 tablet Kaloni Bisaillon R, NP      PDMP not reviewed this encounter.   Teresa Shelba SAUNDERS, [place] 11/10/23 1217

## 2023-11-10 NOTE — Discharge Instructions (Addendum)
 Your symptoms are most likely caused by a virus, it will work its way out your system over the next few days  You can use zofran  every 8 hours as needed for nausea, be mindful this medication may make you drowsy, take the first dose at home to see how it affects your body  May use over-the-counter Imodium if you begin to have diarrhea  You can use over-the-counter ibuprofen  or Tylenol , which ever you have at home, to help manage fevers  Continue to promote hydration throughout the day by using electrolyte replacement solution such as Gatorade, body armor, Pedialyte, which ever you have at home  Try eating bland foods such as bread, rice, toast, fruit which are easier on the stomach to digest, avoid foods that are overly spicy, overly seasoned or greasy

## 2023-11-10 NOTE — ED Triage Notes (Signed)
 Patient complains of nausea and vomiting. Patient reports that she vomited twice. Denies head ache and abdominal pain. Patient states that she was at work when this started. 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

## 2023-11-26 DIAGNOSIS — Z419 Encounter for procedure for purposes other than remedying health state, unspecified: Secondary | ICD-10-CM | POA: Diagnosis not present

## 2023-12-26 ENCOUNTER — Telehealth

## 2023-12-26 DIAGNOSIS — N760 Acute vaginitis: Secondary | ICD-10-CM

## 2023-12-26 DIAGNOSIS — B9689 Other specified bacterial agents as the cause of diseases classified elsewhere: Secondary | ICD-10-CM | POA: Diagnosis not present

## 2023-12-26 MED ORDER — METRONIDAZOLE 500 MG PO TABS
500.0000 mg | ORAL_TABLET | Freq: Two times a day (BID) | ORAL | 0 refills | Status: AC
Start: 2023-12-26 — End: 2024-01-02

## 2023-12-26 NOTE — Progress Notes (Signed)
 E-Visit for Vaginal Symptoms  We are sorry that you are not feeling well. Here is how we plan to help! Based on what you shared with me it looks like you: May have a vaginosis due to bacteria  Vaginosis is an inflammation of the vagina that can result in discharge, itching and pain. The cause is usually a change in the normal balance of vaginal bacteria or an infection. Vaginosis can also result from reduced estrogen levels after menopause.  The most common causes of vaginosis are:   Bacterial vaginosis which results from an overgrowth of one on several organisms that are normally present in your vagina.   Yeast infections which are caused by a naturally occurring fungus called candida.   Vaginal atrophy (atrophic vaginosis) which results from the thinning of the vagina from reduced estrogen levels after menopause.   Trichomoniasis which is caused by a parasite and is commonly transmitted by sexual intercourse.  Factors that increase your risk of developing vaginosis include: Medications, such as antibiotics and steroids Uncontrolled diabetes Use of hygiene products such as bubble bath, vaginal spray or vaginal deodorant Douching Wearing damp or tight-fitting clothing Using an intrauterine device (IUD) for birth control Hormonal changes, such as those associated with pregnancy, birth control pills or menopause Sexual activity Having a sexually transmitted infection  Your treatment plan is Metronidazole or Flagyl 500mg  twice a day for 7 days.  I have electronically sent this prescription into the pharmacy that you have chosen.  Be sure to take all of the medication as directed. Stop taking any medication if you develop a rash, tongue swelling or shortness of breath. Mothers who are breast feeding should consider pumping and discarding their breast milk while on these antibiotics. However, there is no consensus that infant exposure at these doses would be harmful.  Remember that  medication creams can weaken latex condoms. SABRA   HOME CARE:  Good hygiene may prevent some types of vaginosis from recurring and may relieve some symptoms:  Avoid baths, hot tubs and whirlpool spas. Rinse soap from your outer genital area after a shower, and dry the area well to prevent irritation. Don't use scented or harsh soaps, such as those with deodorant or antibacterial action. Avoid irritants. These include scented tampons and pads. Wipe from front to back after using the toilet. Doing so avoids spreading fecal bacteria to your vagina.  Other things that may help prevent vaginosis include:  Don't douche. Your vagina doesn't require cleansing other than normal bathing. Repetitive douching disrupts the normal organisms that reside in the vagina and can actually increase your risk of vaginal infection. Douching won't clear up a vaginal infection. Use a latex condom. Both female and female latex condoms may help you avoid infections spread by sexual contact. Wear cotton underwear. Also wear pantyhose with a cotton crotch. If you feel comfortable without it, skip wearing underwear to bed. Yeast thrives in [employer] Your symptoms should improve in the next day or two.  GET HELP RIGHT AWAY IF:  You have pain in your lower abdomen ( pelvic area or over your ovaries) You develop nausea or vomiting You develop a fever Your discharge changes or worsens You have persistent pain with intercourse You develop shortness of breath, a rapid pulse, or you faint.  These symptoms could be signs of problems or infections that need to be evaluated by a medical provider now.  MAKE SURE YOU   Understand these instructions. Will watch your condition. Will get help right  away if you are not doing well or get worse.  Thank you for choosing an e-visit.  Your e-visit answers were reviewed by a board certified advanced clinical practitioner to complete your personal care plan. Depending upon the  condition, your plan could have included both over the counter or prescription medications.  Please review your pharmacy choice. Make sure the pharmacy is open so you can pick up prescription now. If there is a problem, you may contact your provider through [employer] and have the prescription routed to another pharmacy.  Your safety is important to us . If you have drug allergies check your prescription carefully.   For the next 24 hours you can use MyChart to ask questions about today's visit, request a non-urgent call back, or ask for a work or school excuse. You will get an email in the next two days asking about your experience. I hope that your e-visit has been valuable and will speed your recovery.  I have spent 5 minutes in review of e-visit questionnaire, review and updating patient chart, medical decision making and response to patient.   Kaitlyn CHRISTELLA Dickinson, PA-C

## 2023-12-27 DIAGNOSIS — Z419 Encounter for procedure for purposes other than remedying health state, unspecified: Secondary | ICD-10-CM | POA: Diagnosis not present

## 2024-01-18 ENCOUNTER — Telehealth: Admitting: Family Medicine

## 2024-01-18 DIAGNOSIS — J069 Acute upper respiratory infection, unspecified: Secondary | ICD-10-CM | POA: Diagnosis not present

## 2024-01-18 MED ORDER — IPRATROPIUM BROMIDE 0.03 % NA SOLN: 2.0000 | Freq: Two times a day (BID) | NASAL | 0 refills | Status: DC

## 2024-01-18 NOTE — Progress Notes (Signed)

## 2024-01-24 DIAGNOSIS — R102 Pelvic and perineal pain: Secondary | ICD-10-CM | POA: Diagnosis not present

## 2024-01-25 DIAGNOSIS — N941 Unspecified dyspareunia: Secondary | ICD-10-CM | POA: Diagnosis not present

## 2024-01-25 DIAGNOSIS — R102 Pelvic and perineal pain: Secondary | ICD-10-CM | POA: Diagnosis not present

## 2024-01-27 DIAGNOSIS — Z419 Encounter for procedure for purposes other than remedying health state, unspecified: Secondary | ICD-10-CM | POA: Diagnosis not present

## 2024-02-13 DIAGNOSIS — R102 Pelvic and perineal pain: Secondary | ICD-10-CM | POA: Diagnosis not present

## 2024-02-13 DIAGNOSIS — Z0001 Encounter for general adult medical examination with abnormal findings: Secondary | ICD-10-CM | POA: Diagnosis not present

## 2024-02-13 DIAGNOSIS — Z1331 Encounter for screening for depression: Secondary | ICD-10-CM | POA: Diagnosis not present

## 2024-02-13 DIAGNOSIS — N941 Unspecified dyspareunia: Secondary | ICD-10-CM | POA: Diagnosis not present

## 2024-02-13 DIAGNOSIS — Z23 Encounter for immunization: Secondary | ICD-10-CM | POA: Diagnosis not present

## 2024-02-13 DIAGNOSIS — Z6830 Body mass index (BMI) 30.0-30.9, adult: Secondary | ICD-10-CM | POA: Diagnosis not present

## 2024-02-13 DIAGNOSIS — Z131 Encounter for screening for diabetes mellitus: Secondary | ICD-10-CM | POA: Diagnosis not present

## 2024-02-13 DIAGNOSIS — N923 Ovulation bleeding: Secondary | ICD-10-CM | POA: Diagnosis not present

## 2024-02-13 DIAGNOSIS — E66812 Obesity, class 2: Secondary | ICD-10-CM | POA: Diagnosis not present

## 2024-02-13 DIAGNOSIS — Z13220 Encounter for screening for lipoid disorders: Secondary | ICD-10-CM | POA: Diagnosis not present

## 2024-02-20 DIAGNOSIS — E538 Deficiency of other specified B group vitamins: Secondary | ICD-10-CM | POA: Diagnosis not present

## 2024-02-27 DIAGNOSIS — E538 Deficiency of other specified B group vitamins: Secondary | ICD-10-CM | POA: Diagnosis not present

## 2024-03-05 DIAGNOSIS — E538 Deficiency of other specified B group vitamins: Secondary | ICD-10-CM | POA: Diagnosis not present

## 2024-03-12 DIAGNOSIS — E538 Deficiency of other specified B group vitamins: Secondary | ICD-10-CM | POA: Diagnosis not present

## 2024-03-26 DIAGNOSIS — Z23 Encounter for immunization: Secondary | ICD-10-CM | POA: Diagnosis not present

## 2024-04-11 DIAGNOSIS — E538 Deficiency of other specified B group vitamins: Secondary | ICD-10-CM | POA: Diagnosis not present

## 2024-05-28 ENCOUNTER — Ambulatory Visit: Admitting: Family Medicine

## 2024-06-18 ENCOUNTER — Telehealth: Admitting: Family Medicine

## 2024-06-18 VITALS — Ht 66.0 in | Wt 191.0 lb

## 2024-06-18 DIAGNOSIS — Z6830 Body mass index (BMI) 30.0-30.9, adult: Secondary | ICD-10-CM | POA: Diagnosis not present

## 2024-06-18 DIAGNOSIS — R030 Elevated blood-pressure reading, without diagnosis of hypertension: Secondary | ICD-10-CM

## 2024-06-18 DIAGNOSIS — Z86711 Personal history of pulmonary embolism: Secondary | ICD-10-CM | POA: Diagnosis not present

## 2024-06-18 DIAGNOSIS — R600 Localized edema: Secondary | ICD-10-CM | POA: Diagnosis not present

## 2024-06-18 DIAGNOSIS — E538 Deficiency of other specified B group vitamins: Secondary | ICD-10-CM | POA: Insufficient documentation

## 2024-06-18 DIAGNOSIS — E66811 Obesity, class 1: Secondary | ICD-10-CM | POA: Diagnosis not present

## 2024-06-18 DIAGNOSIS — E559 Vitamin D deficiency, unspecified: Secondary | ICD-10-CM | POA: Insufficient documentation

## 2024-06-18 MED ORDER — HYDROCHLOROTHIAZIDE 25 MG PO TABS: 12.5000 mg | ORAL_TABLET | Freq: Every day | ORAL | 3 refills | Status: AC

## 2024-06-18 MED ORDER — WEGOVY 1.5 MG PO TABS: 1.5000 mg | ORAL_TABLET | Freq: Every day | ORAL | 3 refills | Status: DC

## 2024-06-18 NOTE — Patient Instructions (Signed)
 To keep you healthy, please keep in mind the following health maintenance items that you are due for:   Health Maintenance Due  Topic Date Due   Cervical Cancer Screening (Pap smear)  01/29/2021   COVID-19 Vaccine (5 - 2025-26 season) 01/16/2024   HPV VACCINES (3 - 3-dose SCDM series) 08/12/2024     Best Wishes,   Dr. Lang

## 2024-06-20 ENCOUNTER — Other Ambulatory Visit: Payer: Self-pay

## 2024-06-20 ENCOUNTER — Telehealth: Payer: Self-pay

## 2024-06-20 DIAGNOSIS — Z6830 Body mass index (BMI) 30.0-30.9, adult: Secondary | ICD-10-CM

## 2024-06-20 MED ORDER — WEGOVY 1.5 MG PO TABS: 1.5000 mg | ORAL_TABLET | Freq: Every day | ORAL | 3 refills | Status: AC

## 2024-06-20 NOTE — Telephone Encounter (Signed)
 Called [facility] to cancel medication, spoke with [place]. She stated medication was transfer out to [facility], my call was transfer to them and their wait time on hold to speak with a representative was 1 hour 5 mins. Per [facility] pharmacy medication rejected.

## 2024-06-20 NOTE — Telephone Encounter (Signed)
 Sent med in to [facility]

## 2024-06-20 NOTE — Telephone Encounter (Signed)
 Copied from CRM #[id number]. Topic: Clinical - Prescription Issue >> Jun 20, 2024  1:07 PM Kaitlyn Turner wrote: Reason for CRM: Pt needs her Wegovy  Rx [facility] to [id number] [address], [place] - [address] [address] Phone: [phone] Fax: [phone]  Instead of [facility] or [facility]. Currently at [facility] but her insurance is medicaid.

## 2024-08-01 ENCOUNTER — Ambulatory Visit: Admitting: Family Medicine

## 2024-08-23 ENCOUNTER — Ambulatory Visit: Admitting: Family Medicine

## 2024-09-14 ENCOUNTER — Ambulatory Visit: Admitting: Family Medicine
